# Patient Record
Sex: Female | Born: 1970 | Race: White | Hispanic: No | Marital: Single | Smoking: Current every day smoker
Health system: Southern US, Community
[De-identification: ages and names within clinical notes are randomized; demographics above are authoritative.]

## PROBLEM LIST (undated history)

## (undated) DIAGNOSIS — D473 Essential (hemorrhagic) thrombocythemia: Secondary | ICD-10-CM

## (undated) DIAGNOSIS — B009 Herpesviral infection, unspecified: Secondary | ICD-10-CM

## (undated) DIAGNOSIS — R87619 Unspecified abnormal cytological findings in specimens from cervix uteri: Secondary | ICD-10-CM

## (undated) DIAGNOSIS — N946 Dysmenorrhea, unspecified: Secondary | ICD-10-CM

## (undated) DIAGNOSIS — K149 Disease of tongue, unspecified: Secondary | ICD-10-CM

## (undated) DIAGNOSIS — R519 Headache, unspecified: Secondary | ICD-10-CM

## (undated) DIAGNOSIS — F329 Major depressive disorder, single episode, unspecified: Secondary | ICD-10-CM

## (undated) DIAGNOSIS — D649 Anemia, unspecified: Secondary | ICD-10-CM

## (undated) DIAGNOSIS — F319 Bipolar disorder, unspecified: Secondary | ICD-10-CM

## (undated) DIAGNOSIS — M1990 Unspecified osteoarthritis, unspecified site: Secondary | ICD-10-CM

## (undated) DIAGNOSIS — G8929 Other chronic pain: Secondary | ICD-10-CM

## (undated) DIAGNOSIS — C801 Malignant (primary) neoplasm, unspecified: Secondary | ICD-10-CM

## (undated) DIAGNOSIS — R51 Headache: Secondary | ICD-10-CM

## (undated) DIAGNOSIS — F32A Depression, unspecified: Secondary | ICD-10-CM

## (undated) DIAGNOSIS — A64 Unspecified sexually transmitted disease: Secondary | ICD-10-CM

## (undated) DIAGNOSIS — K635 Polyp of colon: Secondary | ICD-10-CM

## (undated) DIAGNOSIS — K219 Gastro-esophageal reflux disease without esophagitis: Secondary | ICD-10-CM

## (undated) DIAGNOSIS — J309 Allergic rhinitis, unspecified: Secondary | ICD-10-CM

## (undated) DIAGNOSIS — A048 Other specified bacterial intestinal infections: Secondary | ICD-10-CM

## (undated) DIAGNOSIS — D219 Benign neoplasm of connective and other soft tissue, unspecified: Secondary | ICD-10-CM

## (undated) DIAGNOSIS — L739 Follicular disorder, unspecified: Secondary | ICD-10-CM

## (undated) DIAGNOSIS — D75839 Thrombocytosis, unspecified: Secondary | ICD-10-CM

## (undated) DIAGNOSIS — R5383 Other fatigue: Secondary | ICD-10-CM

## (undated) HISTORY — DX: Bipolar disorder, unspecified: F31.9

## (undated) HISTORY — PX: ESOPHAGOGASTRODUODENOSCOPY: SHX1529

## (undated) HISTORY — DX: Thrombocytosis, unspecified: D75.839

## (undated) HISTORY — DX: Other fatigue: R53.83

## (undated) HISTORY — DX: Polyp of colon: K63.5

## (undated) HISTORY — DX: Depression, unspecified: F32.A

## (undated) HISTORY — DX: Disease of tongue, unspecified: K14.9

## (undated) HISTORY — DX: Allergic rhinitis, unspecified: J30.9

## (undated) HISTORY — DX: Other chronic pain: G89.29

## (undated) HISTORY — DX: Malignant (primary) neoplasm, unspecified: C80.1

## (undated) HISTORY — DX: Benign neoplasm of connective and other soft tissue, unspecified: D21.9

## (undated) HISTORY — DX: Anemia, unspecified: D64.9

## (undated) HISTORY — DX: Unspecified sexually transmitted disease: A64

## (undated) HISTORY — DX: Unspecified abnormal cytological findings in specimens from cervix uteri: R87.619

## (undated) HISTORY — PX: SKIN CANCER EXCISION: SHX779

## (undated) HISTORY — DX: Other specified bacterial intestinal infections: A04.8

## (undated) HISTORY — DX: Herpesviral infection, unspecified: B00.9

## (undated) HISTORY — DX: Headache: R51

## (undated) HISTORY — DX: Headache, unspecified: R51.9

## (undated) HISTORY — DX: Follicular disorder, unspecified: L73.9

## (undated) HISTORY — DX: Unspecified osteoarthritis, unspecified site: M19.90

## (undated) HISTORY — DX: Major depressive disorder, single episode, unspecified: F32.9

## (undated) HISTORY — DX: Dysmenorrhea, unspecified: N94.6

## (undated) HISTORY — DX: Essential (hemorrhagic) thrombocythemia: D47.3

---

## 1997-12-29 ENCOUNTER — Other Ambulatory Visit: Admission: RE | Admit: 1997-12-29 | Discharge: 1997-12-29 | Payer: Self-pay | Admitting: Gynecology

## 1998-02-18 HISTORY — PX: CHOLECYSTECTOMY: SHX55

## 1999-07-04 ENCOUNTER — Other Ambulatory Visit: Admission: RE | Admit: 1999-07-04 | Discharge: 1999-07-04 | Payer: Self-pay | Admitting: Obstetrics and Gynecology

## 1999-10-08 ENCOUNTER — Other Ambulatory Visit: Admission: RE | Admit: 1999-10-08 | Discharge: 1999-10-08 | Payer: Self-pay | Admitting: Gynecology

## 1999-11-20 ENCOUNTER — Encounter (INDEPENDENT_AMBULATORY_CARE_PROVIDER_SITE_OTHER): Payer: Self-pay | Admitting: Specialist

## 1999-11-20 ENCOUNTER — Other Ambulatory Visit: Admission: RE | Admit: 1999-11-20 | Discharge: 1999-11-20 | Payer: Self-pay | Admitting: Gynecology

## 2000-04-04 ENCOUNTER — Emergency Department (HOSPITAL_COMMUNITY): Admission: EM | Admit: 2000-04-04 | Discharge: 2000-04-04 | Payer: Self-pay | Admitting: Emergency Medicine

## 2000-04-04 ENCOUNTER — Encounter: Payer: Self-pay | Admitting: Emergency Medicine

## 2000-11-10 ENCOUNTER — Encounter: Payer: Self-pay | Admitting: Emergency Medicine

## 2000-11-10 ENCOUNTER — Emergency Department (HOSPITAL_COMMUNITY): Admission: EM | Admit: 2000-11-10 | Discharge: 2000-11-10 | Payer: Self-pay | Admitting: Emergency Medicine

## 2000-11-14 ENCOUNTER — Encounter (INDEPENDENT_AMBULATORY_CARE_PROVIDER_SITE_OTHER): Payer: Self-pay | Admitting: *Deleted

## 2000-11-14 ENCOUNTER — Observation Stay (HOSPITAL_COMMUNITY): Admission: RE | Admit: 2000-11-14 | Discharge: 2000-11-15 | Payer: Self-pay | Admitting: General Surgery

## 2000-11-14 ENCOUNTER — Encounter: Payer: Self-pay | Admitting: General Surgery

## 2000-12-18 ENCOUNTER — Ambulatory Visit (HOSPITAL_COMMUNITY): Admission: RE | Admit: 2000-12-18 | Discharge: 2000-12-18 | Payer: Self-pay | Admitting: General Surgery

## 2000-12-18 ENCOUNTER — Encounter: Payer: Self-pay | Admitting: General Surgery

## 2001-10-11 ENCOUNTER — Encounter: Payer: Self-pay | Admitting: Emergency Medicine

## 2001-10-11 ENCOUNTER — Emergency Department (HOSPITAL_COMMUNITY): Admission: EM | Admit: 2001-10-11 | Discharge: 2001-10-11 | Payer: Self-pay | Admitting: Emergency Medicine

## 2001-10-19 ENCOUNTER — Emergency Department (HOSPITAL_COMMUNITY): Admission: EM | Admit: 2001-10-19 | Discharge: 2001-10-19 | Payer: Self-pay | Admitting: Emergency Medicine

## 2001-11-04 ENCOUNTER — Other Ambulatory Visit: Admission: RE | Admit: 2001-11-04 | Discharge: 2001-11-04 | Payer: Self-pay | Admitting: Gynecology

## 2002-05-04 ENCOUNTER — Emergency Department (HOSPITAL_COMMUNITY): Admission: AC | Admit: 2002-05-04 | Discharge: 2002-05-04 | Payer: Self-pay

## 2002-05-05 ENCOUNTER — Encounter: Payer: Self-pay | Admitting: Emergency Medicine

## 2002-10-11 ENCOUNTER — Encounter: Admission: RE | Admit: 2002-10-11 | Discharge: 2002-10-11 | Payer: Self-pay | Admitting: Internal Medicine

## 2002-10-11 ENCOUNTER — Encounter: Payer: Self-pay | Admitting: Internal Medicine

## 2002-12-15 ENCOUNTER — Other Ambulatory Visit: Admission: RE | Admit: 2002-12-15 | Discharge: 2002-12-15 | Payer: Self-pay | Admitting: Obstetrics and Gynecology

## 2003-11-24 ENCOUNTER — Ambulatory Visit: Payer: Self-pay | Admitting: Internal Medicine

## 2003-12-07 ENCOUNTER — Emergency Department (HOSPITAL_COMMUNITY): Admission: EM | Admit: 2003-12-07 | Discharge: 2003-12-07 | Payer: Self-pay | Admitting: Emergency Medicine

## 2003-12-15 ENCOUNTER — Ambulatory Visit: Payer: Self-pay | Admitting: Internal Medicine

## 2004-01-26 ENCOUNTER — Ambulatory Visit: Payer: Self-pay | Admitting: Internal Medicine

## 2004-01-30 ENCOUNTER — Ambulatory Visit: Payer: Self-pay | Admitting: *Deleted

## 2004-02-24 ENCOUNTER — Ambulatory Visit: Payer: Self-pay | Admitting: Internal Medicine

## 2004-05-18 ENCOUNTER — Ambulatory Visit: Payer: Self-pay | Admitting: Internal Medicine

## 2004-08-24 ENCOUNTER — Ambulatory Visit: Payer: Self-pay | Admitting: Family Medicine

## 2004-10-17 ENCOUNTER — Ambulatory Visit: Payer: Self-pay | Admitting: Internal Medicine

## 2004-10-21 ENCOUNTER — Emergency Department: Payer: Self-pay | Admitting: General Practice

## 2004-11-27 ENCOUNTER — Ambulatory Visit: Payer: Self-pay | Admitting: Internal Medicine

## 2004-12-11 ENCOUNTER — Ambulatory Visit: Payer: Self-pay | Admitting: Internal Medicine

## 2005-07-18 ENCOUNTER — Encounter: Payer: Self-pay | Admitting: Internal Medicine

## 2005-07-18 ENCOUNTER — Ambulatory Visit: Payer: Self-pay | Admitting: Internal Medicine

## 2005-10-25 ENCOUNTER — Ambulatory Visit: Payer: Self-pay | Admitting: Internal Medicine

## 2005-12-02 ENCOUNTER — Ambulatory Visit: Payer: Self-pay | Admitting: Internal Medicine

## 2006-03-14 ENCOUNTER — Ambulatory Visit: Payer: Self-pay | Admitting: Internal Medicine

## 2006-08-04 ENCOUNTER — Encounter (INDEPENDENT_AMBULATORY_CARE_PROVIDER_SITE_OTHER): Payer: Self-pay | Admitting: Internal Medicine

## 2006-08-04 DIAGNOSIS — Z9089 Acquired absence of other organs: Secondary | ICD-10-CM | POA: Insufficient documentation

## 2006-08-04 DIAGNOSIS — F3160 Bipolar disorder, current episode mixed, unspecified: Secondary | ICD-10-CM | POA: Insufficient documentation

## 2006-10-10 ENCOUNTER — Ambulatory Visit: Payer: Self-pay | Admitting: Family Medicine

## 2006-11-05 ENCOUNTER — Encounter (INDEPENDENT_AMBULATORY_CARE_PROVIDER_SITE_OTHER): Payer: Self-pay | Admitting: *Deleted

## 2006-11-20 ENCOUNTER — Ambulatory Visit: Payer: Self-pay | Admitting: Internal Medicine

## 2006-11-20 ENCOUNTER — Encounter: Payer: Self-pay | Admitting: Internal Medicine

## 2006-11-20 LAB — CONVERTED CEMR LAB: TSH: 1.012 microintl units/mL (ref 0.350–5.50)

## 2006-12-03 ENCOUNTER — Ambulatory Visit: Payer: Self-pay | Admitting: Internal Medicine

## 2007-01-20 ENCOUNTER — Ambulatory Visit: Payer: Self-pay | Admitting: Internal Medicine

## 2007-01-20 LAB — CONVERTED CEMR LAB

## 2007-03-11 ENCOUNTER — Ambulatory Visit: Payer: Self-pay | Admitting: Internal Medicine

## 2007-03-11 LAB — CONVERTED CEMR LAB
Calcium: 9.4 mg/dL (ref 8.4–10.5)
Creatinine, Ser: 0.93 mg/dL (ref 0.40–1.20)
HDL: 49 mg/dL (ref >39)
Triglycerides: 123 mg/dL (ref <150)
Valproic Acid Lvl: 57.1 ug/mL (ref 50.0–100.0)

## 2007-04-16 ENCOUNTER — Ambulatory Visit (HOSPITAL_COMMUNITY): Admission: RE | Admit: 2007-04-16 | Discharge: 2007-04-16 | Payer: Self-pay | Admitting: Family Medicine

## 2007-05-04 ENCOUNTER — Ambulatory Visit: Payer: Self-pay | Admitting: Internal Medicine

## 2007-05-27 ENCOUNTER — Ambulatory Visit: Payer: Self-pay | Admitting: Internal Medicine

## 2007-06-12 ENCOUNTER — Ambulatory Visit: Payer: Self-pay | Admitting: Internal Medicine

## 2007-07-06 ENCOUNTER — Ambulatory Visit: Payer: Self-pay | Admitting: Internal Medicine

## 2007-07-07 ENCOUNTER — Ambulatory Visit (HOSPITAL_COMMUNITY): Admission: RE | Admit: 2007-07-07 | Discharge: 2007-07-07 | Payer: Self-pay | Admitting: Internal Medicine

## 2007-07-17 ENCOUNTER — Ambulatory Visit (HOSPITAL_COMMUNITY): Admission: RE | Admit: 2007-07-17 | Discharge: 2007-07-17 | Payer: Self-pay | Admitting: Internal Medicine

## 2007-08-04 ENCOUNTER — Ambulatory Visit: Payer: Self-pay | Admitting: Internal Medicine

## 2007-08-04 LAB — CONVERTED CEMR LAB
CO2: 27 meq/L (ref 19–32)
Chloride: 102 meq/L (ref 96–112)
Creatinine, Ser: 0.8 mg/dL (ref 0.40–1.20)
HDL: 47 mg/dL (ref >39)
LDL Cholesterol: 83 mg/dL (ref 0–99)

## 2007-10-06 ENCOUNTER — Ambulatory Visit: Payer: Self-pay | Admitting: Internal Medicine

## 2007-11-10 ENCOUNTER — Ambulatory Visit: Payer: Self-pay | Admitting: Internal Medicine

## 2007-11-11 ENCOUNTER — Encounter (INDEPENDENT_AMBULATORY_CARE_PROVIDER_SITE_OTHER): Payer: Self-pay | Admitting: Internal Medicine

## 2007-11-11 LAB — CONVERTED CEMR LAB: Chlamydia, DNA Probe: NEGATIVE

## 2007-11-12 ENCOUNTER — Ambulatory Visit: Payer: Self-pay | Admitting: Internal Medicine

## 2008-04-12 ENCOUNTER — Ambulatory Visit: Payer: Self-pay | Admitting: Internal Medicine

## 2008-05-27 ENCOUNTER — Emergency Department (HOSPITAL_COMMUNITY): Admission: EM | Admit: 2008-05-27 | Discharge: 2008-05-27 | Payer: Self-pay | Admitting: Emergency Medicine

## 2008-09-30 IMAGING — CR DG LUMBAR SPINE COMPLETE 4+V
5 series · 5 of 5 positions shown · non-contrast
Comparison: none

CLINICAL DATA: 36 year-old with low back pain.
 LUMBAR SPINE - __ VIEW:

[t l-spine a.p.]
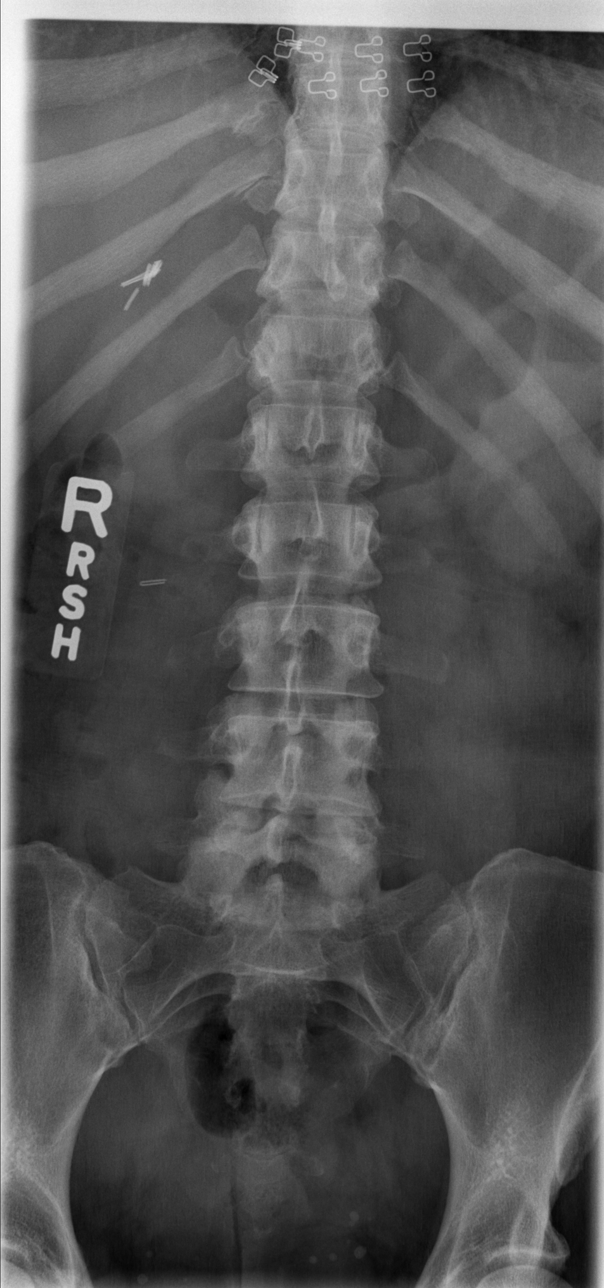

[t l-spine oblique exposure (1 of 2)]
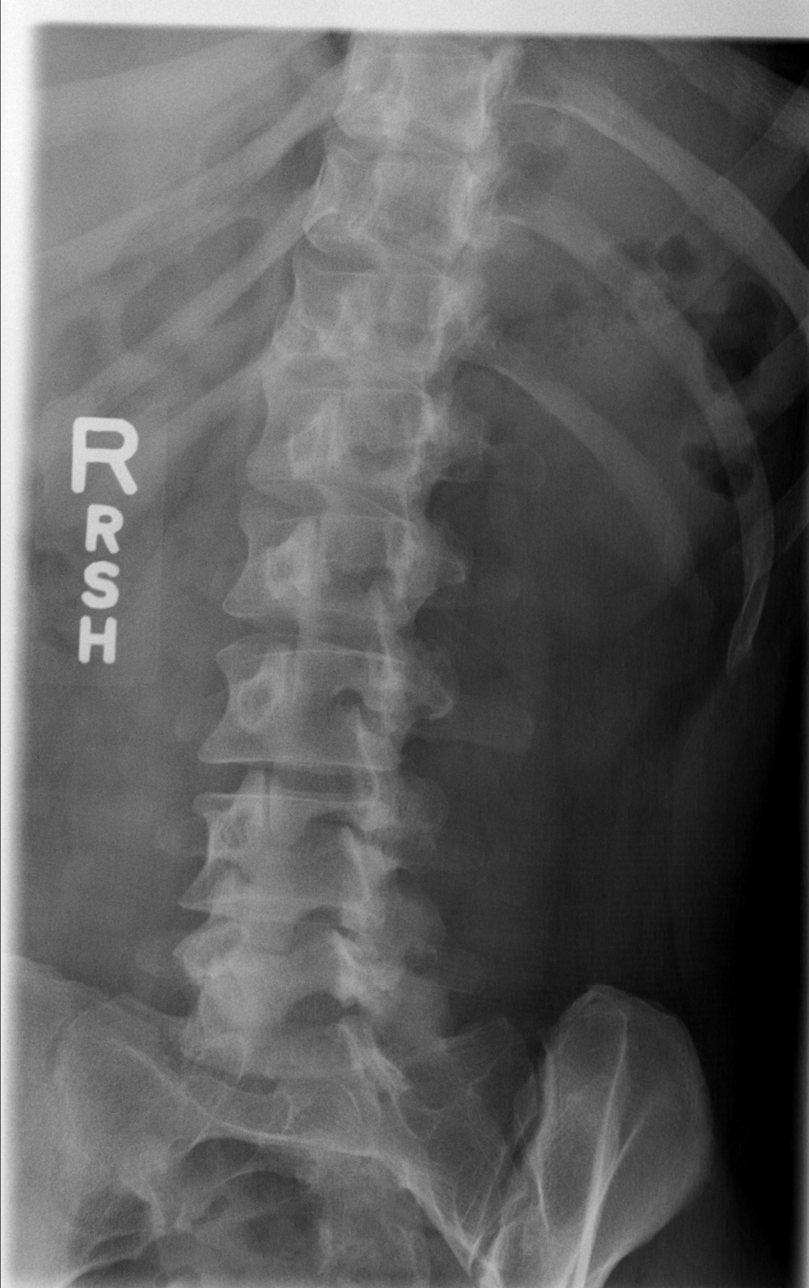

[t l-spine oblique exposure (2 of 2)]
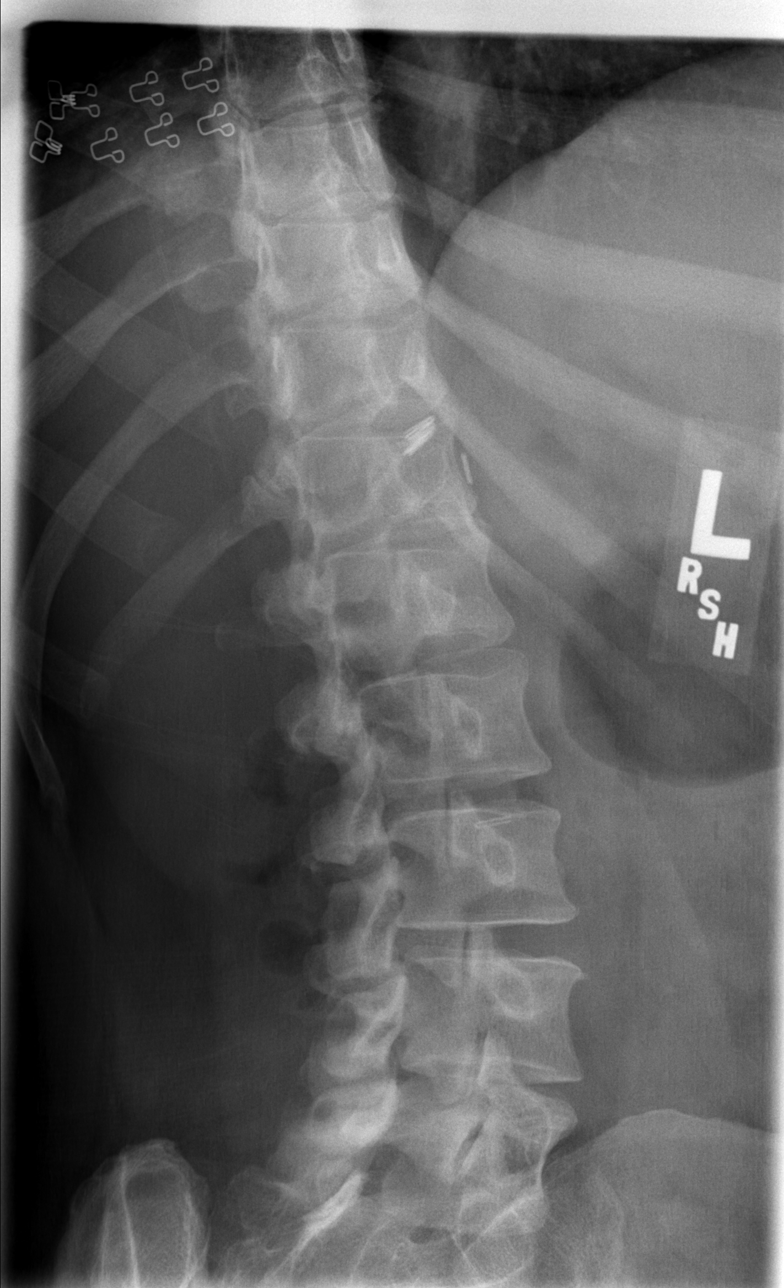

[t l-spine lat]
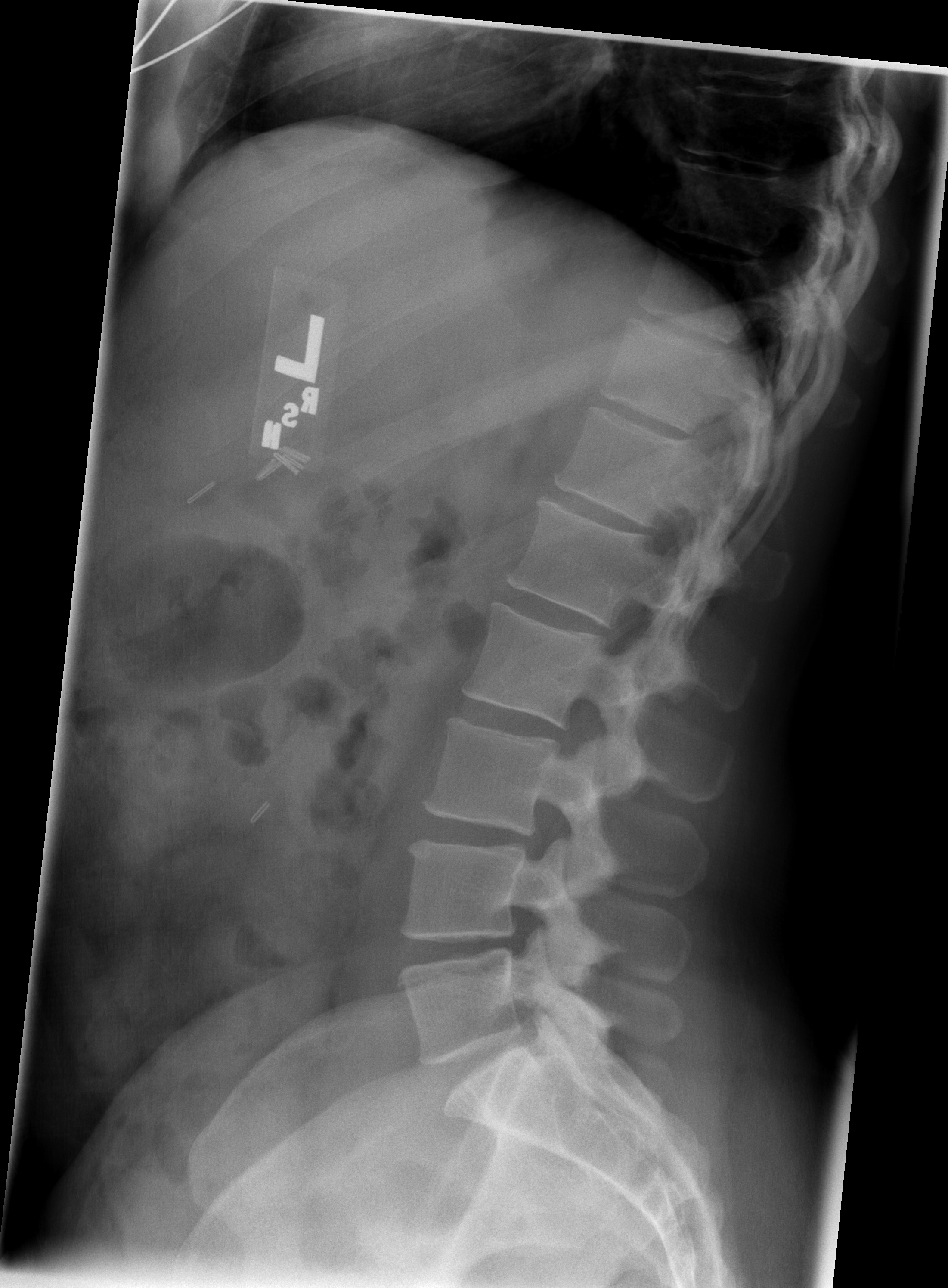

[t l-spine l5-s1 spot]
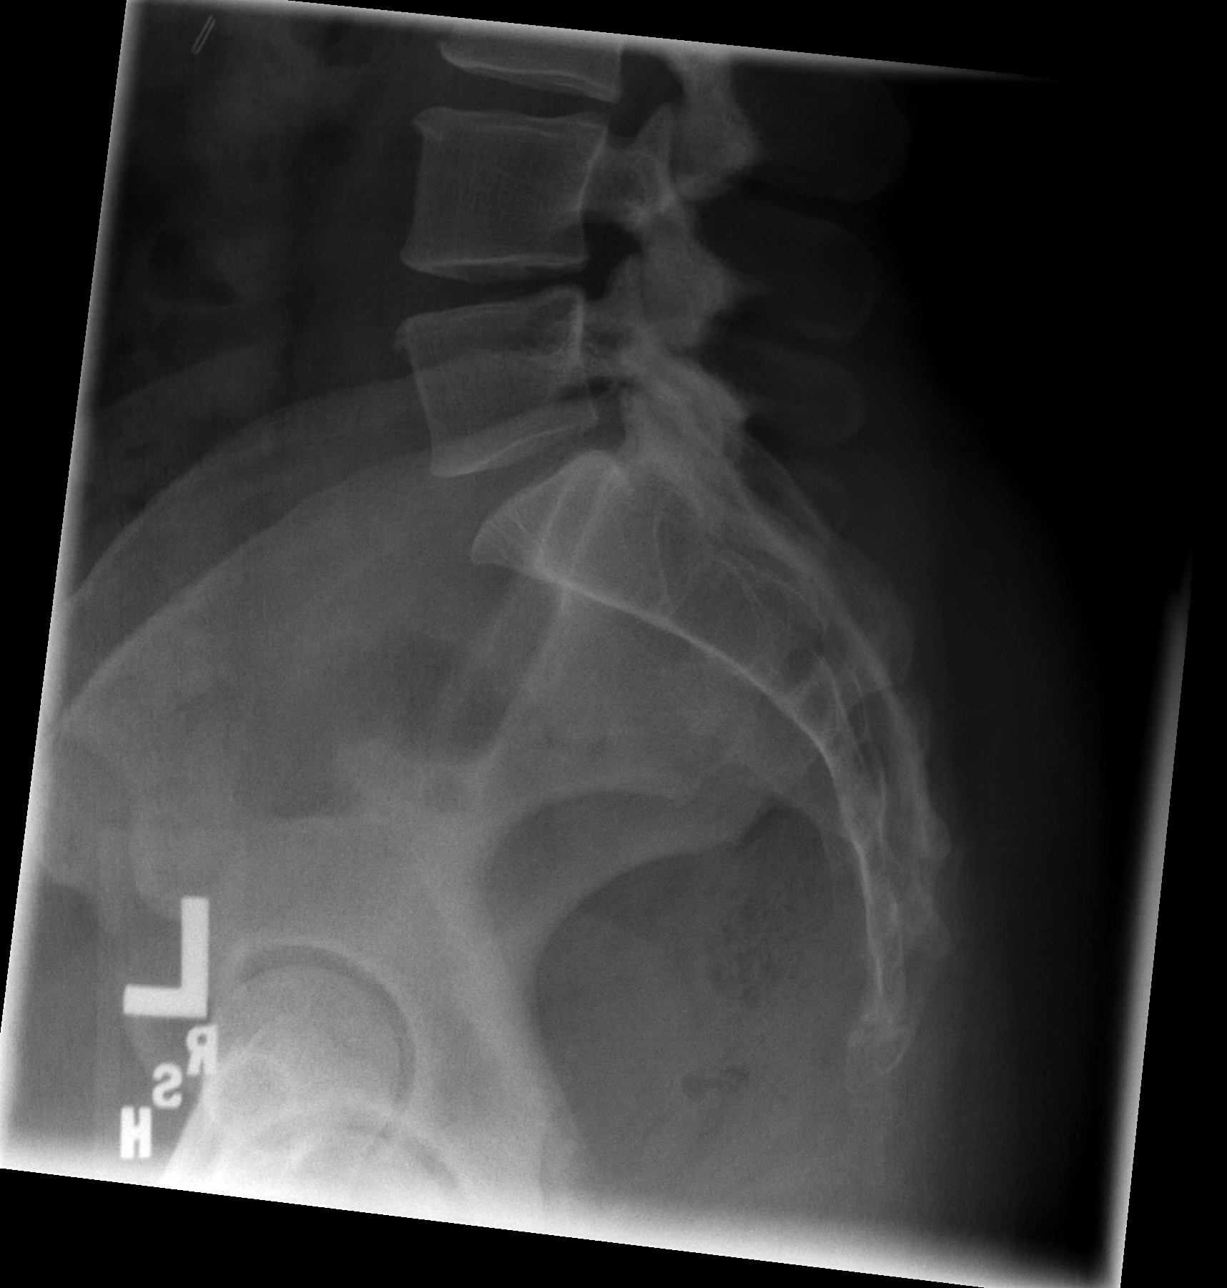

[5 of 5 positions shown; findings below may reference images not displayed]

FINDINGS: AP lateral and oblique images of the lumbar spine were obtained. The alignment of the lumbar spine is normal. The disk spaces are maintained. Mild degenerative endplate changes in the lower thoracic spine. No acute bone abnormalities. Mild degenerative endplate changes in the lumbar spine. Surgical clips in the right abdomen consistent with cholecystectomy. The spinous process of T-12 is low-lying compared to the T-11 spinous process.
IMPRESSION: No acute bone abnormalities of the lumbar spine.

## 2008-10-14 ENCOUNTER — Ambulatory Visit (HOSPITAL_COMMUNITY): Admission: RE | Admit: 2008-10-14 | Discharge: 2008-10-14 | Payer: Self-pay | Admitting: General Surgery

## 2008-10-14 ENCOUNTER — Encounter (INDEPENDENT_AMBULATORY_CARE_PROVIDER_SITE_OTHER): Payer: Self-pay | Admitting: General Surgery

## 2009-01-19 ENCOUNTER — Ambulatory Visit: Payer: Self-pay | Admitting: Internal Medicine

## 2009-01-19 LAB — CONVERTED CEMR LAB
Alkaline Phosphatase: 67 units/L (ref 39–117)
BUN: 19 mg/dL (ref 6–23)
Basophils Absolute: 0.1 10*3/uL (ref 0.0–0.1)
Basophils Relative: 1 % (ref 0–1)
Creatinine, Ser: 0.79 mg/dL (ref 0.40–1.20)
Eosinophils Absolute: 0.1 10*3/uL (ref 0.0–0.7)
Eosinophils Relative: 2 % (ref 0–5)
GC Probe Amp, Genital: NEGATIVE
Glucose, Bld: 86 mg/dL (ref 70–99)
HCT: 38.3 % (ref 36.0–46.0)
HDL: 45 mg/dL (ref >39)
Hemoglobin: 12.3 g/dL (ref 12.0–15.0)
LDL Cholesterol: 120 mg/dL — ABNORMAL HIGH (ref 0–99)
MCHC: 32.1 g/dL (ref 30.0–36.0)
Monocytes Absolute: 0.5 10*3/uL (ref 0.1–1.0)
Neutro Abs: 3.9 10*3/uL (ref 1.7–7.7)
RDW: 13.9 % (ref 11.5–15.5)
Total Bilirubin: 0.3 mg/dL (ref 0.3–1.2)
Triglycerides: 187 mg/dL — ABNORMAL HIGH (ref <150)
VLDL: 37 mg/dL (ref 0–40)

## 2009-01-19 LAB — HM PAP SMEAR: HM Pap smear: NEGATIVE

## 2009-02-16 ENCOUNTER — Ambulatory Visit (HOSPITAL_COMMUNITY): Admission: RE | Admit: 2009-02-16 | Discharge: 2009-02-16 | Payer: Self-pay | Admitting: Psychiatry

## 2009-05-10 ENCOUNTER — Ambulatory Visit (HOSPITAL_COMMUNITY): Admission: RE | Admit: 2009-05-10 | Discharge: 2009-05-10 | Payer: Self-pay | Admitting: Internal Medicine

## 2009-05-23 ENCOUNTER — Emergency Department (HOSPITAL_COMMUNITY): Admission: EM | Admit: 2009-05-23 | Discharge: 2009-05-23 | Payer: Self-pay | Admitting: Family Medicine

## 2009-05-31 ENCOUNTER — Encounter (INDEPENDENT_AMBULATORY_CARE_PROVIDER_SITE_OTHER): Payer: Self-pay | Admitting: Family Medicine

## 2009-05-31 ENCOUNTER — Ambulatory Visit: Payer: Self-pay | Admitting: Family Medicine

## 2009-07-11 ENCOUNTER — Ambulatory Visit: Payer: Self-pay | Admitting: Internal Medicine

## 2009-07-20 ENCOUNTER — Emergency Department (HOSPITAL_COMMUNITY): Admission: EM | Admit: 2009-07-20 | Discharge: 2009-07-20 | Payer: Self-pay | Admitting: Family Medicine

## 2009-07-24 ENCOUNTER — Emergency Department (HOSPITAL_COMMUNITY): Admission: EM | Admit: 2009-07-24 | Discharge: 2009-07-24 | Payer: Self-pay | Admitting: Family Medicine

## 2009-08-08 ENCOUNTER — Ambulatory Visit: Payer: Self-pay | Admitting: Internal Medicine

## 2009-08-08 LAB — CONVERTED CEMR LAB
Albumin: 4 g/dL (ref 3.5–5.2)
Alkaline Phosphatase: 72 units/L (ref 39–117)
BUN: 19 mg/dL (ref 6–23)
Basophils Absolute: 0 10*3/uL (ref 0.0–0.1)
Basophils Relative: 1 % (ref 0–1)
CO2: 28 meq/L (ref 19–32)
Calcium: 9.1 mg/dL (ref 8.4–10.5)
Eosinophils Relative: 3 % (ref 0–5)
Glucose, Bld: 90 mg/dL (ref 70–99)
HCT: 39.3 % (ref 36.0–46.0)
Hemoglobin: 12.5 g/dL (ref 12.0–15.0)
Lymphocytes Relative: 37 % (ref 12–46)
MCHC: 31.8 g/dL (ref 30.0–36.0)
Monocytes Absolute: 0.5 10*3/uL (ref 0.1–1.0)
Monocytes Relative: 6 % (ref 3–12)
Potassium: 4.6 meq/L (ref 3.5–5.3)
RBC: 4.31 M/uL (ref 3.87–5.11)
RDW: 15.2 % (ref 11.5–15.5)
Total Protein: 6.5 g/dL (ref 6.0–8.3)
Valproic Acid Lvl: 2.7 ug/mL — ABNORMAL LOW (ref 50.0–100.0)

## 2009-08-15 ENCOUNTER — Ambulatory Visit: Payer: Self-pay | Admitting: Family Medicine

## 2009-08-15 DIAGNOSIS — M25569 Pain in unspecified knee: Secondary | ICD-10-CM | POA: Insufficient documentation

## 2009-08-15 DIAGNOSIS — M6530 Trigger finger, unspecified finger: Secondary | ICD-10-CM | POA: Insufficient documentation

## 2009-08-15 DIAGNOSIS — M549 Dorsalgia, unspecified: Secondary | ICD-10-CM | POA: Insufficient documentation

## 2010-03-10 ENCOUNTER — Encounter: Payer: Self-pay | Admitting: Internal Medicine

## 2010-03-11 ENCOUNTER — Encounter: Payer: Self-pay | Admitting: Internal Medicine

## 2010-03-20 NOTE — Assessment & Plan Note (Signed)
Summary: NP,R TRIGGER THUMB,R KNEE PAIN,MC   Vital Signs:  Patient profile:   40 year old female Height:      60 inches Weight:      145 pounds BMI:     28.42 BP sitting:   106 / 63  Vitals Entered By: Lillia Pauls CMA (August 15, 2009 11:44 AM)  History of Present Illness: Rt Trigger Thumb: Pt has had a trigger thumb for about 5 months. She says the thumb pops and gets stuck at certain positions. She says it waxes and waynes. She tried Mobic 2 months ago but says it did not help.   Rt knee pain: Pt has had Rt knee pain that started in Jan when she fell on her knee. She went to the [facility] and had x-rays. She was told that she did not have any fractures and was recommended to do some rehab. She was unable to pay for rehab so she did not go. She has continued to move. She says it keeps her from sitting with her knee flexed and abducted. She says the pain is posterior. no mechanical symptoms.  Upper back pain: Pt has had some upper back pain and headaches the last 2 weeks. She feels like her upper back is tight and it sends pain up her neck and into her shoulder. It does not send numbness or tingling. She has been taking pain meds for it but they have not been helping.   Allergies: 1)  Cipro  Physical Exam  General:  GEN: Well-developed,well-nourished,in no acute distress; alert,appropriate and cooperative throughout examination HEENT: Normocephalic and atraumatic without obvious abnormalities. No apparent alopecia or balding. Ears, externally no deformities PULM: Breathing comfortably in no respiratory distress EXT: No clubbing, cyanosis, or edema PSYCH: Normally interactive. Cooperative during the interview. Pleasant. Friendly and conversant. Not anxious or depressed appearing. Normal, full affect.  Msk:  upper Rt sided back muscle tightness. mostly notable in the paraspinous region in the  middle to lower Tonette Bihari is region. Also in the rhomboid region. Normal range of motion  of the neck  Rt thumb has tender point at the base of the thumb. No erythema, no swelling. Decreased ROM. palpable nodule. Otherwise wrist stable with full composite grip and negative axial loading test and a negative and nontender anatomical soft box  Rt knee: Pt has not tenderness that can be elicited on exam. She has a neg McMurry's, Apply's, ACL,PCL,LCL and MCL all normal with special testing.   no effusion.  Nontender to medial and lateral patellar facet loading. Negative grind maneuver. Negative bounce test. Negative flexion pinch test.   Impression & Recommendations:  Problem # 1:  TRIGGER FINGER, RIGHT THUMB (ICD-727.03) Assessment New  Pt recieved an injection today. Discussed with patient to let us know if she is has any further trouble with her thumb. Can do a second injection in 4-6 weeks if not improved.   >45 minutes spent in total face to face time with the patient with >50% of time spent in counselling and coordination of care: the majority of this face-to-face encounter was spent in  explaining various conditions, discussion, reviewed anatomy, review of rehabilitation..  Trigger Finger Injection Verbal consent was obtained. Risks, benefits and alternatives were discussed. Prepped with Betadine and Ethyl Chloride used for anesthesia. Under sterile conditions, patient injected at palmar crease aiming distally with 45 degree angle towards nodule; injected directly into tendon sheath. Medication flowed freely without resistance.  Needle size: 25 gauge 1 1/2 inch  Injection: 1/2 cc of Lidocaine 1% and 1/2 cc of Kenalog 40 mg   Orders: Injection, Tendon / Ligament (20550) Kenalog 10mg  (up to 2 units) (T5573)  Problem # 2:  KNEE PAIN, RIGHT, CHRONIC (ICD-719.46) Assessment: New PT has had knee pain since Jan when she fell. all testing of the knee was neg.   reassured the patient, do not think that there is any significant internal arrangement. Reviewed general need  rehabilitation strengthening  Her updated medication list for this problem includes:    Diclofenac Sodium 75 Mg Tbec (Diclofenac sodium) ..... One tab by mouth two times a day as needed pain    Cyclobenzaprine Hcl 10 Mg Tabs (Cyclobenzaprine hcl) .Marland Kitchen... 1/2 - 1 by mouth at bedtime as needed muscle spasm  Problem # 3:  BACK PAIN, UPPER (ICD-724.5) Assessment: New Pt likely has a muscle strain of the upper back causing her headaches.   scapular stabilization was reviewed with the patient.  Her updated medication list for this problem includes:    Diclofenac Sodium 75 Mg Tbec (Diclofenac sodium) ..... One tab by mouth two times a day as needed pain    Cyclobenzaprine Hcl 10 Mg Tabs (Cyclobenzaprine hcl) .Marland Kitchen... 1/2 - 1 by mouth at bedtime as needed muscle spasm  Complete Medication List: 1)  Protonix 40 Mg Tbec (Pantoprazole sodium) .... One by mouth qd 2)  Acyclovir 200 Mg Caps (Acyclovir) .... 2 by mouth two times a day as needed outbreak 3)  Wellbutrin Xl 300 Mg Tb24 (Bupropion hcl) .... One by mouth qd 4)  Trazodone Hcl 100 Mg Tabs (Trazodone hcl) .... One half tab qhs 5)  Diclofenac Sodium 75 Mg Tbec (Diclofenac sodium) .... One tab by mouth two times a day as needed pain 6)  Cyclobenzaprine Hcl 10 Mg Tabs (Cyclobenzaprine hcl) .... 1/2 - 1 by mouth at bedtime as needed muscle spasm Prescriptions: CYCLOBENZAPRINE HCL 10 MG TABS (CYCLOBENZAPRINE HCL) 1/2 - 1 by mouth at bedtime as needed muscle spasm  #20 x 0   Entered and Authorized by:   Jannifer Rodney MD   Signed by:   Jannifer Rodney MD on 08/15/2009   Method used:   Electronically to        [facility] (retail)       [address]       [place], [place]  [address]       Ph: [phone]       Fax: [phone]   RxID:   [id number] DICLOFENAC SODIUM 75 MG TBEC (DICLOFENAC SODIUM) One tab by mouth two times a day as needed pain  #60 x 0   Entered and Authorized by:   Jannifer Rodney MD    Signed by:   Jannifer Rodney MD on 08/15/2009   Method used:   Electronically to        [facility] (retail)       [address]       [place], [place]  [address]       Ph: [phone]       Fax: [phone]   RxID:   [id number]

## 2010-05-07 LAB — POCT I-STAT, CHEM 8
Creatinine, Ser: 1 mg/dL (ref 0.4–1.2)
HCT: 44 % (ref 36.0–46.0)
Hemoglobin: 15 g/dL (ref 12.0–15.0)
Potassium: 4.1 mEq/L (ref 3.5–5.1)
Sodium: 138 mEq/L (ref 135–145)
TCO2: 29 mmol/L (ref 0–100)

## 2010-05-26 LAB — CBC
Hemoglobin: 12.1 g/dL (ref 12.0–15.0)
RBC: 4.08 MIL/uL (ref 3.87–5.11)
WBC: 9.9 10*3/uL (ref 4.0–10.5)

## 2010-07-03 NOTE — Op Note (Signed)
NAMEBRIANNI, MANTHE                 ACCOUNT NO.:  [id number]   MEDICAL RECORD NO.:  [id number]          PATIENT TYPE:  AMB   LOCATION:  SDS                          FACILITY:  [facility]   PHYSICIAN:  Ollen Gross. Vernell Morgans, M.D. 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   PROCEDURE:  Wide excision of squamous cell carcinoma in situ from the  gluteal cleft.   SURGEON:  Ollen Gross. Vernell Morgans, MD   ANESTHESIA:  General endotracheal.   PROCEDURE:  After informed consent was obtained, the patient was brought  to the operating room and left in the supine position on a stretcher.  After an adequate induction of general anesthesia, the patient was then  moved into a prone position on the operating room table and all pressure  points were padded.  Her buttocks were retracted laterally with some  tape.  The gluteal cleft area and perirectal area was then all prepped  with Betadine and draped in the usual sterile manner.  The area in  question had been previously reexcised once with a positive margin.  It  was then reexcised by a pathologist, but no pathology was sent.  An  elliptical incision was made around the patient's old scar.  This was  carried down through the skin and subcutaneous tissue sharply with  electrocautery until the area was completely reexcised.  It was oriented  with a short stitch superior and a long stitch on the left side.  It was  then sent to Pathology for further evaluation.  Hemostasis was achieved  using Bovie electrocautery.  The wound was infiltrated with 0.25%  Marcaine.  The wound was then closed with interrupted 4-0 Monocryl  subcuticular stitches and a Dermabond dressing was applied.  The patient  tolerated the  procedure well.  At the end of the case, all needle,  sponge, and instrument counts were correct.  The patient was awakened  and taken to the recovery room in stable condition.      Ollen Gross. Vernell Morgans, M.D.  Electronically Signed     PST/MEDQ  D:  10/14/2008  T:  10/14/2008  Job:  [id number]

## 2010-07-06 NOTE — Consult Note (Signed)
[facility]  Patient:    Sarah Barry, Sarah Barry Visit Number: [id number] MRN: [id number]          Service Type: EMS Location: ED Attending Physician:  Benny Lennert Dictated by:   [place] Daphine Deutscher, M.D. Proc. Date: 11/10/00 Admit Date:  11/10/2000 Discharge Date: 11/10/2000                            Consultation Report  CHIEF COMPLAINT:  Mid epigastric pain radiating into the back.  HISTORY:  This is a 40 year old white female with a history of GERD treated by Dr. Prentiss Bells in the past. Currently, she is in between doctors since Dr. Prentiss Bells is going to cut back her practice. About 2:30 this morning, she began having chest pain radiating to her back that she described as severe. She was seen in the emergency room here around 3:50 and labs drawn at that time demonstrated white count of 10,700, hemoglobin 13.2, apparently normal differential. Lipase was normal at 27 and amylase was normal at 79. Her electrolytes were apparently within the normal range. She was kept her and had an ultrasound which showed multiple small gallstones. There is no pericholecystic fluid noted. There was no wall thickening noted. I did review the ultrasound in the emergency department.  ALLERGIES:  No known drug allergies.  PAST SURGICAL HISTORY:  The patient has had no prior abdominal surgery.  Physical exam is remarkable in that she has some tenderness in her mid epigastrium and right upper quadrant. She has received some Demerol. We discussed, with a woman accompanying her, her options including observation and seeing if the pain goes away followed by elective discussion about laparoscopic cholecystectomy versus going ahead and keeping her today and doing a laparoscopic cholecystectomy later this afternoon. She would like to wait and see if this pain can get better. We will give her a little shot of Toradol, and she wants to see if it abates and she wants to go home and try  to manage her symptoms with diet and consider elective cholecystectomy. Will discuss with ER doctor regarding disposition and disposition today will depend on whether her pain abates over the next two hours.  IMPRESSION:  Acute cholecystitis, possibly resolving.  PLAN:  As described above. Dictated by:   [place] Daphine Deutscher, M.D. Attending Physician:  Benny Lennert DD:  11/10/00 TD:  11/10/00 Job: [id number]

## 2010-07-06 NOTE — Op Note (Signed)
[facility]  Patient:    Sarah Barry, Sarah Barry Visit Number: [id number] MRN: [id number]          Service Type: OBV Location: 3W 4782 01 Attending Physician:  Caleen Essex Dictated by:   Ollen Gross. Vernell Morgans, M.D. Proc. Date: 11/14/00 Admit Date:  11/14/2000 Discharge Date: 11/15/2000                             Operative Report  PREOPERATIVE DIAGNOSIS:  Cholelithiasis.  POSTOPERATIVE DIAGNOSIS:  Cholelithiasis.  PROCEDURE:  Laparoscopic cholecystectomy with intraoperative cholangiogram.  SURGEON:  Ollen Gross. Vernell Morgans, M.D.  ASSISTANT:  Anselm Pancoast. Zachery Dakins, M.D.  ANESTHESIA:  General endotracheal.  DESCRIPTION OF PROCEDURE:  After informed consent was obtained, the patient was brought to the operating room and placed in the supine position on the operating table.  After adequate induction of general anesthesia, the patients abdomen was prepped with Betadine, and draped in the usual sterile fashion.  Another area just above the umbilicus was infiltrated with 0.25% Marcaine, and a small transverse incision was made.  This incision was carried down through the subcutaneous tissues, and blunt dissection with Ora clamp and Army-Navy retractors until the linea alba was identified.  The linea alba was incised with the 15 blade knife, and each side was grasped with Kocher clamps and elevated anteriorly.  The preperitoneal space was then probed bluntly with the hemostat until the peritoneum was opened and access obtained into the abdominal cavity.  A finger was inserted through this opening to make sure there were no adhesions to the anterior abdominal wall and there were none apparent.  A 0 Vicryl pursestring stitch was then placed on the fascia surrounding this opening, and the laparoscope was placed through this opening, and anchored in place with the previously placed Vicryl pursestring stitch. The abdomen was then insufflated with carbon dioxide without  difficulty.  The laparoscope was inserted into the Hasson cannula and the dome of the gallbladder and liver edge were readily identifiable.  A small transverse upper midline incision was made with the 15 blade knife after infiltrating this area with 0.25% Marcaine.  A 10 mm port was then placed bluntly through this incision into the abdominal cavity without difficulty under direct vision.  A placed was then chosen laterally on the right side of the abdomen for placement of 5 mm ports, and these areas were infiltrated with 0.25% Marcaine.  Small stab incisions were made with the 15 blade knife.  The two 5 mm ports were then inserted through these incisions bluntly under direct vision.  A blunt grasper was then placed through the lateral most 5 mm port and used to grasp the dome of the gallbladder and elevate it anteriorly and superiorly. Another blunt grasper was placed at the other 5 mm port and used to retract on the body and neck of the gallbladder.  A dissector was placed through the upper midline port, and used in a combination with the electrocautery and blunt dissection, the peritoneal reflection over the top of the gallbladder neck was opened.  Blunt dissection was then carried out in this area until the gallbladder neck and cystic duct junction was readily identified and dissected in a circumferential manner.  Care was taken to keep the common duct medial in this dissection.  Once the gallbladder and the cystic duct junction was identified, a clip was placed distally on the gallbladder neck side, and a small  ductotomy was made and the cystic duct with a laparoscopic scissors.  A 14-gauge angiocath was inserted percutaneously through the anterior abdominal wall.  A Reddick catheter was placed through this into the abdominal cavity.  Using the dissector, the Reddick catheter was placed within the cystic duct, and anchored in place with a clip.  A cholangiogram was then obtained  which showed no filling defects within the biliary tree.  The anchoring clip was then removed, and the angiocath and Reddick catheter were also removed.  Three clips were then placed proximally on the cystic duct, and the duct was divided between the two sets of clips.  Posterior to this, the cystic artery was identified.  Two clips were placed proximally and one distally on the cystic artery, and the artery was divided between the two.  Next, a laparoscopic hook cautery was used to separate the gallbladder from the liver bed.  Prior to completely detaching the gallbladder from the liver bed, the liver bed was inspected and several small bleeding points required coagulation with the Bovie electrocautery until the liver bed was hemostatic. The gallbladder was then detached the rest of the way from the liver bed.  An endoscopic bag was then placed through the upper midline port, and the gallbladder was placed within the endoscopic bag.  The laparoscope was then moved to the upper midline port and the gallbladder and endoscopic bag were removed together through the Hasson cannula and the supraumbilical port.  The Hasson was then replaced and the laparoscope was moved back to the Hasson cannula.  The abdomen was then inspected and the liver bed was found to be hemostatic.  The abdomen was then irrigated with copious amounts of saline until the effluent was clear.  The ports were then all removed under direct vision and found to be hemostatic.  The fascia at the supraumbilical port was closed with the previously placed Vicryl pursestring stitch.  The skin incisions were closed with interrupted 4-0 Monocryl subcuticular stitches.  Benzoin and Steri-Strips were applied.  The patient tolerated the procedure well.  At the end of the case, all needle, sponge, and instrument counts were correct.  The patient was then awakened and taken to the recovery room in stable condition. Dictated by:   Ollen Gross. Vernell Morgans, M.D. Attending Physician:  Caleen Essex DD:  12/22/00 TD:  12/23/00 Job: [id number]

## 2010-09-06 ENCOUNTER — Ambulatory Visit (HOSPITAL_COMMUNITY)
Admission: RE | Admit: 2010-09-06 | Discharge: 2010-09-06 | Disposition: A | Discharge status: Home / Self Care | Payer: Self-pay | Attending: Psychiatry | Admitting: Psychiatry

## 2010-09-06 DIAGNOSIS — F1920 Other psychoactive substance dependence, uncomplicated: Secondary | ICD-10-CM | POA: Insufficient documentation

## 2010-09-07 ENCOUNTER — Other Ambulatory Visit (HOSPITAL_COMMUNITY): Payer: PRIVATE HEALTH INSURANCE | Admitting: Psychiatry

## 2010-09-10 ENCOUNTER — Other Ambulatory Visit (HOSPITAL_COMMUNITY): Payer: PRIVATE HEALTH INSURANCE | Attending: Psychiatry | Admitting: Psychiatry

## 2010-09-10 DIAGNOSIS — F1920 Other psychoactive substance dependence, uncomplicated: Secondary | ICD-10-CM | POA: Insufficient documentation

## 2010-09-10 DIAGNOSIS — F329 Major depressive disorder, single episode, unspecified: Secondary | ICD-10-CM | POA: Insufficient documentation

## 2010-09-10 DIAGNOSIS — Z560 Unemployment, unspecified: Secondary | ICD-10-CM | POA: Insufficient documentation

## 2010-09-12 ENCOUNTER — Other Ambulatory Visit (HOSPITAL_COMMUNITY): Payer: PRIVATE HEALTH INSURANCE | Admitting: Psychiatry

## 2010-09-14 ENCOUNTER — Other Ambulatory Visit (HOSPITAL_COMMUNITY): Payer: PRIVATE HEALTH INSURANCE | Admitting: Psychiatry

## 2010-09-17 ENCOUNTER — Other Ambulatory Visit (HOSPITAL_COMMUNITY): Payer: PRIVATE HEALTH INSURANCE | Admitting: Psychiatry

## 2010-09-18 ENCOUNTER — Other Ambulatory Visit: Payer: Self-pay | Admitting: Family Medicine

## 2010-09-19 ENCOUNTER — Other Ambulatory Visit (HOSPITAL_COMMUNITY): Payer: PRIVATE HEALTH INSURANCE | Attending: Psychiatry | Admitting: Psychiatry

## 2010-09-19 DIAGNOSIS — F329 Major depressive disorder, single episode, unspecified: Secondary | ICD-10-CM | POA: Insufficient documentation

## 2010-09-19 DIAGNOSIS — F1920 Other psychoactive substance dependence, uncomplicated: Secondary | ICD-10-CM | POA: Insufficient documentation

## 2010-09-19 DIAGNOSIS — Z560 Unemployment, unspecified: Secondary | ICD-10-CM | POA: Insufficient documentation

## 2010-09-21 ENCOUNTER — Other Ambulatory Visit (HOSPITAL_COMMUNITY): Payer: PRIVATE HEALTH INSURANCE | Admitting: Psychiatry

## 2010-09-24 ENCOUNTER — Other Ambulatory Visit (HOSPITAL_COMMUNITY): Payer: PRIVATE HEALTH INSURANCE | Admitting: Psychiatry

## 2010-09-26 ENCOUNTER — Other Ambulatory Visit (HOSPITAL_COMMUNITY): Payer: PRIVATE HEALTH INSURANCE | Admitting: Psychiatry

## 2010-09-27 ENCOUNTER — Other Ambulatory Visit (HOSPITAL_COMMUNITY): Payer: Self-pay | Admitting: Family Medicine

## 2010-09-27 DIAGNOSIS — Z1231 Encounter for screening mammogram for malignant neoplasm of breast: Secondary | ICD-10-CM

## 2010-09-28 ENCOUNTER — Other Ambulatory Visit (HOSPITAL_COMMUNITY): Payer: PRIVATE HEALTH INSURANCE | Admitting: Psychiatry

## 2010-10-01 ENCOUNTER — Other Ambulatory Visit (HOSPITAL_COMMUNITY): Payer: PRIVATE HEALTH INSURANCE | Admitting: Psychiatry

## 2010-10-03 ENCOUNTER — Other Ambulatory Visit (HOSPITAL_COMMUNITY): Payer: PRIVATE HEALTH INSURANCE | Admitting: Psychiatry

## 2010-10-03 ENCOUNTER — Ambulatory Visit (HOSPITAL_COMMUNITY)
Admission: RE | Admit: 2010-10-03 | Discharge: 2010-10-03 | Disposition: A | Discharge status: Home / Self Care | Payer: PRIVATE HEALTH INSURANCE | Source: Ambulatory Visit | Attending: Family Medicine | Admitting: Family Medicine

## 2010-10-03 DIAGNOSIS — Z1231 Encounter for screening mammogram for malignant neoplasm of breast: Secondary | ICD-10-CM | POA: Insufficient documentation

## 2010-10-03 LAB — HM MAMMOGRAPHY: HM Mammogram: NEGATIVE

## 2010-10-05 ENCOUNTER — Other Ambulatory Visit (HOSPITAL_COMMUNITY): Payer: PRIVATE HEALTH INSURANCE | Admitting: Psychiatry

## 2010-10-08 ENCOUNTER — Other Ambulatory Visit (HOSPITAL_COMMUNITY): Payer: PRIVATE HEALTH INSURANCE | Admitting: Psychiatry

## 2010-10-10 ENCOUNTER — Other Ambulatory Visit (HOSPITAL_COMMUNITY): Payer: Self-pay | Admitting: Psychiatry

## 2010-10-10 ENCOUNTER — Other Ambulatory Visit (HOSPITAL_COMMUNITY): Payer: PRIVATE HEALTH INSURANCE | Admitting: Psychiatry

## 2010-10-11 LAB — URINE DRUGS OF ABUSE SCREEN W ALC, ROUTINE (REF LAB)
Amphetamine Screen, Ur: NEGATIVE
Barbiturate Quant, Ur: NEGATIVE
Benzodiazepines.: NEGATIVE
Marijuana Metabolite: NEGATIVE
Methadone: NEGATIVE
Opiate Screen, Urine: NEGATIVE
Propoxyphene: NEGATIVE

## 2010-10-12 ENCOUNTER — Other Ambulatory Visit (HOSPITAL_COMMUNITY): Payer: PRIVATE HEALTH INSURANCE | Admitting: Psychiatry

## 2010-10-15 ENCOUNTER — Other Ambulatory Visit (HOSPITAL_COMMUNITY): Payer: PRIVATE HEALTH INSURANCE | Admitting: Psychiatry

## 2010-10-17 ENCOUNTER — Other Ambulatory Visit (HOSPITAL_COMMUNITY): Payer: PRIVATE HEALTH INSURANCE

## 2010-10-19 ENCOUNTER — Other Ambulatory Visit (HOSPITAL_COMMUNITY): Payer: PRIVATE HEALTH INSURANCE

## 2010-10-23 ENCOUNTER — Other Ambulatory Visit (HOSPITAL_COMMUNITY): Payer: PRIVATE HEALTH INSURANCE | Attending: Physician Assistant

## 2010-10-23 ENCOUNTER — Other Ambulatory Visit (HOSPITAL_COMMUNITY): Payer: Self-pay | Admitting: Psychiatry

## 2010-10-23 DIAGNOSIS — Z560 Unemployment, unspecified: Secondary | ICD-10-CM | POA: Insufficient documentation

## 2010-10-23 DIAGNOSIS — F1920 Other psychoactive substance dependence, uncomplicated: Secondary | ICD-10-CM | POA: Insufficient documentation

## 2010-10-23 DIAGNOSIS — F329 Major depressive disorder, single episode, unspecified: Secondary | ICD-10-CM | POA: Insufficient documentation

## 2010-10-24 ENCOUNTER — Other Ambulatory Visit (HOSPITAL_COMMUNITY): Payer: PRIVATE HEALTH INSURANCE

## 2010-10-24 LAB — URINE DRUGS OF ABUSE SCREEN W ALC, ROUTINE (REF LAB)
Amphetamine Screen, Ur: NEGATIVE
Barbiturate Quant, Ur: NEGATIVE
Benzodiazepines.: NEGATIVE
Marijuana Metabolite: NEGATIVE
Opiate Screen, Urine: NEGATIVE
Propoxyphene: NEGATIVE

## 2010-10-26 ENCOUNTER — Other Ambulatory Visit (HOSPITAL_COMMUNITY): Payer: PRIVATE HEALTH INSURANCE

## 2010-10-29 ENCOUNTER — Other Ambulatory Visit (HOSPITAL_COMMUNITY): Payer: PRIVATE HEALTH INSURANCE

## 2010-10-30 ENCOUNTER — Other Ambulatory Visit (HOSPITAL_COMMUNITY): Payer: PRIVATE HEALTH INSURANCE

## 2010-10-31 ENCOUNTER — Other Ambulatory Visit (HOSPITAL_COMMUNITY): Payer: PRIVATE HEALTH INSURANCE

## 2010-11-02 ENCOUNTER — Other Ambulatory Visit (HOSPITAL_COMMUNITY): Payer: PRIVATE HEALTH INSURANCE

## 2010-11-05 ENCOUNTER — Other Ambulatory Visit (HOSPITAL_COMMUNITY): Payer: PRIVATE HEALTH INSURANCE

## 2010-11-07 ENCOUNTER — Other Ambulatory Visit (HOSPITAL_COMMUNITY): Payer: Self-pay | Admitting: Psychiatry

## 2010-11-07 ENCOUNTER — Other Ambulatory Visit (HOSPITAL_COMMUNITY): Payer: PRIVATE HEALTH INSURANCE

## 2010-11-08 LAB — URINE DRUGS OF ABUSE SCREEN W ALC, ROUTINE (REF LAB)
Amphetamine Screen, Ur: NEGATIVE
Barbiturate Quant, Ur: NEGATIVE
Benzodiazepines.: NEGATIVE
Ethyl Alcohol: <10 mg/dL (ref <10)
Marijuana Metabolite: NEGATIVE
Opiate Screen, Urine: NEGATIVE
Propoxyphene: NEGATIVE

## 2010-11-09 ENCOUNTER — Other Ambulatory Visit (HOSPITAL_COMMUNITY): Payer: PRIVATE HEALTH INSURANCE

## 2010-11-09 ENCOUNTER — Other Ambulatory Visit (HOSPITAL_COMMUNITY): Payer: Self-pay | Admitting: Psychiatry

## 2010-11-10 LAB — URINE DRUGS OF ABUSE SCREEN W ALC, ROUTINE (REF LAB)
Amphetamine Screen, Ur: NEGATIVE
Barbiturate Quant, Ur: NEGATIVE
Ethyl Alcohol: <10 mg/dL (ref <10)
Marijuana Metabolite: NEGATIVE
Opiate Screen, Urine: NEGATIVE
Propoxyphene: NEGATIVE

## 2010-11-12 ENCOUNTER — Other Ambulatory Visit (HOSPITAL_COMMUNITY): Payer: PRIVATE HEALTH INSURANCE

## 2010-11-14 ENCOUNTER — Other Ambulatory Visit (HOSPITAL_COMMUNITY): Payer: PRIVATE HEALTH INSURANCE

## 2010-11-16 ENCOUNTER — Other Ambulatory Visit (HOSPITAL_COMMUNITY): Payer: Self-pay | Admitting: Psychiatry

## 2010-11-16 ENCOUNTER — Other Ambulatory Visit (HOSPITAL_COMMUNITY): Payer: PRIVATE HEALTH INSURANCE

## 2010-11-17 LAB — URINE DRUGS OF ABUSE SCREEN W ALC, ROUTINE (REF LAB)
Amphetamine Screen, Ur: NEGATIVE
Barbiturate Quant, Ur: NEGATIVE
Ethyl Alcohol: <10 mg/dL (ref <10)
Marijuana Metabolite: NEGATIVE
Opiate Screen, Urine: NEGATIVE

## 2010-11-19 ENCOUNTER — Other Ambulatory Visit (HOSPITAL_COMMUNITY): Payer: Self-pay | Admitting: Psychiatry

## 2010-11-19 ENCOUNTER — Other Ambulatory Visit (HOSPITAL_COMMUNITY): Payer: PRIVATE HEALTH INSURANCE | Attending: Physician Assistant

## 2010-11-19 DIAGNOSIS — F329 Major depressive disorder, single episode, unspecified: Secondary | ICD-10-CM | POA: Insufficient documentation

## 2010-11-19 DIAGNOSIS — F1920 Other psychoactive substance dependence, uncomplicated: Secondary | ICD-10-CM | POA: Insufficient documentation

## 2010-11-19 DIAGNOSIS — Z560 Unemployment, unspecified: Secondary | ICD-10-CM | POA: Insufficient documentation

## 2010-11-20 LAB — URINE DRUGS OF ABUSE SCREEN W ALC, ROUTINE (REF LAB)
Amphetamine Screen, Ur: NEGATIVE
Barbiturate Quant, Ur: NEGATIVE
Creatinine,U: 36 mg/dL
Ethyl Alcohol: <10 mg/dL (ref <10)
Marijuana Metabolite: NEGATIVE
Opiate Screen, Urine: NEGATIVE

## 2010-11-21 ENCOUNTER — Other Ambulatory Visit (HOSPITAL_COMMUNITY): Payer: PRIVATE HEALTH INSURANCE

## 2010-11-23 ENCOUNTER — Other Ambulatory Visit (HOSPITAL_COMMUNITY): Payer: PRIVATE HEALTH INSURANCE

## 2010-11-26 ENCOUNTER — Other Ambulatory Visit (HOSPITAL_COMMUNITY): Payer: Self-pay | Admitting: Physician Assistant

## 2010-11-26 ENCOUNTER — Other Ambulatory Visit (HOSPITAL_COMMUNITY): Payer: PRIVATE HEALTH INSURANCE

## 2010-11-27 LAB — URINE DRUGS OF ABUSE SCREEN W ALC, ROUTINE (REF LAB)
Amphetamine Screen, Ur: NEGATIVE
Barbiturate Quant, Ur: NEGATIVE
Benzodiazepines.: NEGATIVE
Creatinine,U: 35.8 mg/dL
Ethyl Alcohol: <10 mg/dL (ref <10)
Marijuana Metabolite: NEGATIVE
Opiate Screen, Urine: NEGATIVE

## 2010-11-28 ENCOUNTER — Other Ambulatory Visit (HOSPITAL_COMMUNITY): Payer: PRIVATE HEALTH INSURANCE

## 2010-11-30 ENCOUNTER — Other Ambulatory Visit (HOSPITAL_COMMUNITY): Payer: PRIVATE HEALTH INSURANCE

## 2010-12-07 ENCOUNTER — Other Ambulatory Visit (HOSPITAL_COMMUNITY): Payer: Self-pay | Admitting: Physician Assistant

## 2010-12-07 ENCOUNTER — Other Ambulatory Visit (HOSPITAL_COMMUNITY): Payer: PRIVATE HEALTH INSURANCE

## 2010-12-08 LAB — URINE DRUGS OF ABUSE SCREEN W ALC, ROUTINE (REF LAB)
Amphetamine Screen, Ur: NEGATIVE
Benzodiazepines.: NEGATIVE
Creatinine,U: 93.7 mg/dL
Ethyl Alcohol: <10 mg/dL (ref <10)
Marijuana Metabolite: NEGATIVE
Opiate Screen, Urine: NEGATIVE

## 2010-12-10 ENCOUNTER — Other Ambulatory Visit (HOSPITAL_COMMUNITY): Payer: PRIVATE HEALTH INSURANCE

## 2010-12-10 ENCOUNTER — Other Ambulatory Visit (HOSPITAL_COMMUNITY): Payer: Self-pay | Admitting: Physician Assistant

## 2010-12-11 LAB — URINE DRUGS OF ABUSE SCREEN W ALC, ROUTINE (REF LAB)
Barbiturate Quant, Ur: NEGATIVE
Cocaine Metabolites: NEGATIVE
Methadone: NEGATIVE
Phencyclidine (PCP): NEGATIVE
Propoxyphene: NEGATIVE

## 2010-12-12 ENCOUNTER — Other Ambulatory Visit (HOSPITAL_COMMUNITY): Payer: PRIVATE HEALTH INSURANCE

## 2010-12-14 ENCOUNTER — Other Ambulatory Visit (HOSPITAL_COMMUNITY): Payer: PRIVATE HEALTH INSURANCE

## 2010-12-17 ENCOUNTER — Other Ambulatory Visit (HOSPITAL_COMMUNITY): Payer: PRIVATE HEALTH INSURANCE

## 2010-12-19 ENCOUNTER — Other Ambulatory Visit (HOSPITAL_COMMUNITY): Payer: PRIVATE HEALTH INSURANCE | Attending: Physician Assistant

## 2010-12-19 DIAGNOSIS — F1920 Other psychoactive substance dependence, uncomplicated: Secondary | ICD-10-CM | POA: Insufficient documentation

## 2010-12-19 DIAGNOSIS — Z560 Unemployment, unspecified: Secondary | ICD-10-CM | POA: Insufficient documentation

## 2010-12-19 DIAGNOSIS — F329 Major depressive disorder, single episode, unspecified: Secondary | ICD-10-CM | POA: Insufficient documentation

## 2010-12-21 ENCOUNTER — Other Ambulatory Visit (HOSPITAL_COMMUNITY): Payer: PRIVATE HEALTH INSURANCE | Attending: Physician Assistant

## 2010-12-24 ENCOUNTER — Other Ambulatory Visit (HOSPITAL_COMMUNITY): Payer: PRIVATE HEALTH INSURANCE | Admitting: *Deleted

## 2010-12-26 ENCOUNTER — Other Ambulatory Visit (HOSPITAL_COMMUNITY): Payer: PRIVATE HEALTH INSURANCE

## 2010-12-28 ENCOUNTER — Other Ambulatory Visit (HOSPITAL_COMMUNITY): Payer: PRIVATE HEALTH INSURANCE

## 2010-12-31 ENCOUNTER — Other Ambulatory Visit (HOSPITAL_COMMUNITY): Payer: PRIVATE HEALTH INSURANCE

## 2011-01-03 ENCOUNTER — Emergency Department (HOSPITAL_COMMUNITY)
Admission: EM | Admit: 2011-01-03 | Discharge: 2011-01-03 | Disposition: A | Discharge status: Home / Self Care | Payer: PRIVATE HEALTH INSURANCE | Attending: Emergency Medicine | Admitting: Emergency Medicine

## 2011-01-03 ENCOUNTER — Emergency Department (HOSPITAL_COMMUNITY): Payer: PRIVATE HEALTH INSURANCE

## 2011-01-03 DIAGNOSIS — R140 Abdominal distension (gaseous): Secondary | ICD-10-CM

## 2011-01-03 DIAGNOSIS — R221 Localized swelling, mass and lump, neck: Secondary | ICD-10-CM | POA: Insufficient documentation

## 2011-01-03 DIAGNOSIS — K219 Gastro-esophageal reflux disease without esophagitis: Secondary | ICD-10-CM | POA: Insufficient documentation

## 2011-01-03 DIAGNOSIS — R141 Gas pain: Secondary | ICD-10-CM | POA: Insufficient documentation

## 2011-01-03 DIAGNOSIS — Z9889 Other specified postprocedural states: Secondary | ICD-10-CM | POA: Insufficient documentation

## 2011-01-03 DIAGNOSIS — R142 Eructation: Secondary | ICD-10-CM | POA: Insufficient documentation

## 2011-01-03 DIAGNOSIS — F17200 Nicotine dependence, unspecified, uncomplicated: Secondary | ICD-10-CM | POA: Insufficient documentation

## 2011-01-03 DIAGNOSIS — K1370 Unspecified lesions of oral mucosa: Secondary | ICD-10-CM | POA: Insufficient documentation

## 2011-01-03 DIAGNOSIS — K140 Glossitis: Secondary | ICD-10-CM | POA: Insufficient documentation

## 2011-01-03 DIAGNOSIS — R220 Localized swelling, mass and lump, head: Secondary | ICD-10-CM | POA: Insufficient documentation

## 2011-01-03 DIAGNOSIS — K146 Glossodynia: Secondary | ICD-10-CM | POA: Insufficient documentation

## 2011-01-03 DIAGNOSIS — R209 Unspecified disturbances of skin sensation: Secondary | ICD-10-CM | POA: Insufficient documentation

## 2011-01-03 DIAGNOSIS — K121 Other forms of stomatitis: Secondary | ICD-10-CM

## 2011-01-03 DIAGNOSIS — R109 Unspecified abdominal pain: Secondary | ICD-10-CM | POA: Insufficient documentation

## 2011-01-03 DIAGNOSIS — Z79899 Other long term (current) drug therapy: Secondary | ICD-10-CM | POA: Insufficient documentation

## 2011-01-03 HISTORY — DX: Gastro-esophageal reflux disease without esophagitis: K21.9

## 2011-01-03 LAB — CBC
MCH: 27.1 pg (ref 26.0–34.0)
MCV: 84 fL (ref 78.0–100.0)
Platelets: 386 10*3/uL (ref 150–400)
RBC: 4.25 MIL/uL (ref 3.87–5.11)

## 2011-01-03 LAB — URINALYSIS, ROUTINE W REFLEX MICROSCOPIC
Bilirubin Urine: NEGATIVE
Hgb urine dipstick: NEGATIVE
Ketones, ur: NEGATIVE mg/dL
Specific Gravity, Urine: 1.012 (ref 1.005–1.030)
Urobilinogen, UA: 0.2 mg/dL (ref 0.0–1.0)

## 2011-01-03 LAB — COMPREHENSIVE METABOLIC PANEL
CO2: 27 mEq/L (ref 19–32)
Calcium: 9.2 mg/dL (ref 8.4–10.5)
Creatinine, Ser: 0.73 mg/dL (ref 0.50–1.10)
GFR calc Af Amer: >90 mL/min (ref >90)
GFR calc non Af Amer: >90 mL/min (ref >90)
Glucose, Bld: 85 mg/dL (ref 70–99)

## 2011-01-03 MED ORDER — MAGIC MOUTHWASH W/LIDOCAINE
5.0000 mL | ORAL | Status: AC
  Administered 2011-01-03: 5 mL via ORAL
  Filled 2011-01-03: qty 5

## 2011-01-03 NOTE — ED Notes (Signed)
Pt. Reports having gum pain and sores spots on her tongue for 2 months, abdominal bloating began about 3 weeks ago, ABdomein is non-tender and soft, pt. Reports feeling very tired, having tingling in her hands and she has gained 20 lbs since July

## 2011-01-03 NOTE — ED Notes (Addendum)
Diagnosis given to pt per physician.  Pt provided with discharge instructions and resources to obtain a PCP.

## 2011-01-03 NOTE — ED Notes (Addendum)
Gait steady NAD, resp equal/nonlabored. Significant other at [facility].

## 2011-01-03 NOTE — ED Provider Notes (Signed)
History     CSN: [id number] Arrival date & time: 01/03/2011  5:00 PM   First MD Initiated Contact with Patient 01/03/11 1905      Chief Complaint  Patient presents with  . Abdominal Pain    abdominal bloating    (Consider location/radiation/quality/duration/timing/severity/associated sxs/prior treatment) Patient is a 40 y.o. female presenting with abdominal pain. The history is provided by the patient.  Abdominal Pain The primary symptoms of the illness include abdominal pain. The primary symptoms of the illness do not include fever, fatigue, shortness of breath, nausea, vomiting, diarrhea, hematemesis, hematochezia, dysuria, vaginal discharge or vaginal bleeding. The onset of the illness was gradual. The problem has been gradually worsening.  The patient states that she believes she is currently not pregnant. The patient has not had a change in bowel habit. Symptoms associated with the illness do not include chills, anorexia, heartburn, constipation, urgency, hematuria, frequency or back pain.  Pt states over last two months she has had swelling of gums, and mouth sores. States sores are tender, currently has one on her tongue. Also reports weakness and abdominal swelling for the last month. States worsened in the last two weeks. Denies fever, chills, nausea, vomiting, URI symptoms, dysuria, change in bowels, being pregnant. States gained 20lb in the last 2 months. Has intermittent tingling sensation in bilateral hands.  Went to her periodontist today who told her she needs to come to ER.  Past Medical History  Diagnosis Date  . GERD (gastroesophageal reflux disease)     Past Surgical History  Procedure Date  . Cholecystectomy     Family History  Problem Relation Age of Onset  . Cancer Mother   . Thyroid disease Mother   . Diabetes Father     History  Substance Use Topics  . Smoking status: Current Everyday Smoker -- 1.0 packs/day    Types: Cigarettes  . Smokeless tobacco:  Not on file  . Alcohol Use: No    OB History    Grav Para Term Preterm Abortions TAB SAB Ect Mult Living                  Review of Systems  Constitutional: Positive for unexpected weight change. Negative for fever, chills, activity change, appetite change and fatigue.  HENT: Positive for mouth sores and dental problem. Negative for facial swelling.   Eyes: Negative.   Respiratory: Negative.  Negative for shortness of breath.   Cardiovascular: Negative.   Gastrointestinal: Positive for abdominal pain and abdominal distention. Negative for heartburn, nausea, vomiting, diarrhea, constipation, hematochezia, anorexia and hematemesis.  Genitourinary: Negative for dysuria, urgency, frequency, hematuria, flank pain, vaginal bleeding, vaginal discharge and difficulty urinating.  Musculoskeletal: Negative.  Negative for back pain.  Skin: Negative.   Neurological: Positive for numbness. Negative for speech difficulty, weakness and headaches.    Allergies  Review of patient's allergies indicates no known allergies.  Home Medications   Current Outpatient Rx  Name Route Sig Dispense Refill  . AMOXICILLIN 500 MG PO CAPS Oral Take 500 mg by mouth 4 (four) times daily.      Marland Kitchen ESOMEPRAZOLE MAGNESIUM 40 MG PO CPDR Oral Take 40 mg by mouth daily before breakfast.      . IBUPROFEN 200 MG PO TABS Oral Take 800 mg by mouth every 6 (six) hours as needed. For pain     . THERA M PLUS PO TABS Oral Take 1 tablet by mouth daily.      . QUETIAPINE FUMARATE 100 MG  PO TABS Oral Take 150 mg by mouth at bedtime.      . VENLAFAXINE HCL 75 MG PO TABS Oral Take 75 mg by mouth 3 (three) times daily.        BP 119/70  Pulse 88  Temp(Src) 98.4 F (36.9 C) (Oral)  Resp 16  Ht 5' (1.524 m)  Wt 155 lb (70.308 kg)  BMI 30.27 kg/m2  SpO2 98%  Physical Exam  Constitutional: She is oriented to person, place, and time. She appears well-developed and well-nourished. No distress.  HENT:  Head: Normocephalic and  atraumatic.       Normal dentition, no gum disease noted. Small ulceration to the right tip of the tongue, tender.  Eyes: Conjunctivae and EOM are normal. Pupils are equal, round, and reactive to light.  Neck: Neck supple.  Cardiovascular: Normal rate, regular rhythm and normal heart sounds.   Pulmonary/Chest: Breath sounds normal. No respiratory distress. She has no wheezes.  Abdominal: Soft. Bowel sounds are normal. She exhibits no distension and no mass. There is no tenderness. There is no guarding.  Musculoskeletal: Normal range of motion. She exhibits no edema.  Neurological: She is alert and oriented to person, place, and time. She has normal reflexes.    ED Course  Procedures (including critical care time)  Pt in NAD. Small sore to the tongue. Abdomen soft, non tender, obese. VS normal. Normal labs, negative acute abdominal series. Will d/c home with follow up. Results discussed with pt and recommended close follow up for recheck of the mouth sore and her weight gain.    MDM          Lottie Mussel, PA 01/03/11 2302

## 2011-01-03 NOTE — ED Provider Notes (Signed)
Medical screening examination/treatment/procedure(s) were performed by non-physician practitioner and as supervising physician I was immediately available for consultation/collaboration.   Dontreal Miera R. Seira Cody, MD 01/03/11 2341 

## 2011-01-24 ENCOUNTER — Ambulatory Visit (INDEPENDENT_AMBULATORY_CARE_PROVIDER_SITE_OTHER): Payer: PRIVATE HEALTH INSURANCE | Admitting: Physician Assistant

## 2011-01-24 DIAGNOSIS — F39 Unspecified mood [affective] disorder: Secondary | ICD-10-CM

## 2011-01-24 MED ORDER — TRAZODONE HCL 50 MG PO TABS: 50.0000 mg | ORAL_TABLET | Freq: Every day | ORAL | Status: DC

## 2011-01-30 ENCOUNTER — Encounter (HOSPITAL_COMMUNITY): Payer: Self-pay | Admitting: Physician Assistant

## 2011-02-25 ENCOUNTER — Encounter (HOSPITAL_COMMUNITY): Payer: Self-pay | Admitting: *Deleted

## 2011-02-25 ENCOUNTER — Emergency Department (INDEPENDENT_AMBULATORY_CARE_PROVIDER_SITE_OTHER)
Admission: EM | Admit: 2011-02-25 | Discharge: 2011-02-25 | Disposition: A | Discharge status: Home / Self Care | Payer: 59 | Source: Home / Self Care | Attending: Family Medicine | Admitting: Family Medicine

## 2011-02-25 DIAGNOSIS — G44209 Tension-type headache, unspecified, not intractable: Secondary | ICD-10-CM

## 2011-02-25 DIAGNOSIS — K2970 Gastritis, unspecified, without bleeding: Secondary | ICD-10-CM

## 2011-02-25 DIAGNOSIS — K2990 Gastroduodenitis, unspecified, without bleeding: Secondary | ICD-10-CM

## 2011-02-25 MED ORDER — METAXALONE 800 MG PO TABS: 800.0000 mg | ORAL_TABLET | Freq: Three times a day (TID) | ORAL | Status: AC

## 2011-02-25 MED ORDER — GI COCKTAIL ~~LOC~~
30.0000 mL | Freq: Once | ORAL | Status: AC
  Administered 2011-02-25: 30 mL via ORAL

## 2011-02-25 MED ORDER — GI COCKTAIL ~~LOC~~
ORAL | Status: AC
  Filled 2011-02-25: qty 30

## 2011-02-25 NOTE — ED Notes (Signed)
Pt  Reports  Headache  Neck pain      Teeth  Clenching    X   Almost  12 days    denys  Any  Injury  Sitting  Up  U[pright on  Exam  Table   In good  Spirits  She  Also  Reports   Fatigue  At  About the  Same  Time

## 2011-02-25 NOTE — ED Provider Notes (Signed)
History     CSN: [id number]  Arrival date & time 02/25/11  5621   First MD Initiated Contact with Patient 02/25/11 2101      Chief Complaint  Patient presents with  . Headache    (Consider location/radiation/quality/duration/timing/severity/associated sxs/prior treatment) Patient is a 41 y.o. female presenting with headaches. The history is provided by the patient.  Headache The primary symptoms include headaches. Primary symptoms do not include seizures or dizziness. The symptoms began more than 1 week ago. The symptoms are unchanged. Context: comes back daily since 12/26.  The headache is not associated with neck stiffness or weakness.  Additional symptoms include pain. Additional symptoms do not include neck stiffness or weakness.    Past Medical History  Diagnosis Date  . GERD (gastroesophageal reflux disease)     Past Surgical History  Procedure Date  . Cholecystectomy     Family History  Problem Relation Age of Onset  . Cancer Mother   . Thyroid disease Mother   . Diabetes Father     History  Substance Use Topics  . Smoking status: Current Everyday Smoker -- 1.0 packs/day    Types: Cigarettes  . Smokeless tobacco: Not on file  . Alcohol Use: No    OB History    Grav Para Term Preterm Abortions TAB SAB Ect Mult Living                  Review of Systems  Constitutional: Negative.   HENT: Positive for neck pain. Negative for facial swelling and neck stiffness.   Neurological: Positive for headaches. Negative for dizziness, seizures, syncope, facial asymmetry, speech difficulty, weakness, light-headedness and numbness.    Allergies  Review of patient's allergies indicates no known allergies.  Home Medications   Current Outpatient Rx  Name Route Sig Dispense Refill  . AMOXICILLIN 500 MG PO CAPS Oral Take 500 mg by mouth 4 (four) times daily.      Marland Kitchen ESOMEPRAZOLE MAGNESIUM 40 MG PO CPDR Oral Take 40 mg by mouth daily before breakfast.      . IBUPROFEN  200 MG PO TABS Oral Take 800 mg by mouth every 6 (six) hours as needed. For pain     . METAXALONE 800 MG PO TABS Oral Take 1 tablet (800 mg total) by mouth 3 (three) times daily. 21 tablet 0  . THERA M PLUS PO TABS Oral Take 1 tablet by mouth daily.      . QUETIAPINE FUMARATE 100 MG PO TABS Oral Take 150 mg by mouth at bedtime.      . TRAZODONE HCL 50 MG PO TABS Oral Take 1 tablet (50 mg total) by mouth at bedtime. 30 tablet 1  . VENLAFAXINE HCL 75 MG PO TABS Oral Take 75 mg by mouth 3 (three) times daily.        BP 110/65  Pulse 92  Temp(Src) 97.7 F (36.5 C) (Oral)  Resp 16  SpO2 100%  LMP 02/15/2011  Physical Exam  Nursing note and vitals reviewed. Constitutional: She is oriented to person, place, and time. She appears well-developed and well-nourished.  Musculoskeletal: Normal range of motion. She exhibits tenderness.       Cervical back: She exhibits tenderness and spasm. She exhibits normal range of motion, no bony tenderness, no swelling, no edema and no deformity.  Neurological: She is alert and oriented to person, place, and time. She has normal strength. No cranial nerve deficit or sensory deficit.  Skin: Skin is warm and dry.  Psychiatric: She has a normal mood and affect.    ED Course  Procedures (including critical care time)  Labs Reviewed - No data to display No results found.   1. Tension type headache   2. Gastritis       MDM          Barkley Bruns, MD 02/25/11 2119

## 2011-03-04 ENCOUNTER — Ambulatory Visit (INDEPENDENT_AMBULATORY_CARE_PROVIDER_SITE_OTHER): Payer: PRIVATE HEALTH INSURANCE | Admitting: Physician Assistant

## 2011-03-04 DIAGNOSIS — F39 Unspecified mood [affective] disorder: Secondary | ICD-10-CM

## 2011-03-04 NOTE — Progress Notes (Signed)
   [facility] Behavioral Health Follow-up Outpatient Visit  Sarah Barry 1970-03-11  Date: 03/04/11   Subjective: Sarah Barry comes for followup today on her medications prescribed for her mood and insomnia. At her last visit we started her on trazodone 50 mg to be taken nightly as needed. She was instructed to adjust that dose to between 25 mg and 100 mg as best provided relief for insomnia. She reports that even at 25 mg of trazodone she felt groggy throughout the next day. She tried taking it with and without the Seroquel and tried reducing the amount of Seroquel to 50 mg a night, but no matter what she tried he continued to have next day somnolence. She also complains of having developed headaches, which she was evaluated for at an urgent care facility. She was told that they will are stress headaches. She endorses grinding her teeth during the day. She is under significant stress now as she is having to care for her mother and her grandmother. She reports that she can fall asleep okay, without medication, but she wakes every hour to 2 hours throughout the night. She reports that she is able to fall asleep quickly, but is bothered by the fact that she wakes up so often. She reports that she has begun to take multiple over-the-counter dietary supplements, including fiber, magnesium B complex, multivitamin, green coffee bean, and raspberry ketone. She denies any suicidal or homicidal ideation. She denies any auditory or visual hallucinations. She continues to maintain abstinence from substances of abuse. She is attending 12 step meetings and working with a sponsor. She will soon get her driver's license back and obtain an automobile.  There were no vitals filed for this visit.  Mental Status Examination  Appearance: Casual Alert: Yes Attention: good  Cooperative: Yes Eye Contact: Good Speech: Clear and even Psychomotor Activity: Increased Memory/Concentration: Intact Oriented: person, place, time/date  and situation Mood: Euthymic Affect: Congruent Thought Processes and Associations: Linear Fund of Knowledge: Fair Thought Content:  Insight: Fair Judgement: Good  Diagnosis: Mood disorder, not otherwise specified  Treatment Plan: We will continue her Effexor XR at 75 mg and refer her for a sleep study.  Yan Pankratz, PA

## 2011-03-20 ENCOUNTER — Ambulatory Visit (HOSPITAL_BASED_OUTPATIENT_CLINIC_OR_DEPARTMENT_OTHER): Payer: PRIVATE HEALTH INSURANCE | Attending: Physician Assistant | Admitting: Radiology

## 2011-03-20 VITALS — Ht 60.0 in | Wt 155.0 lb

## 2011-03-20 DIAGNOSIS — G4700 Insomnia, unspecified: Secondary | ICD-10-CM | POA: Insufficient documentation

## 2011-03-20 DIAGNOSIS — G4733 Obstructive sleep apnea (adult) (pediatric): Secondary | ICD-10-CM

## 2011-03-20 DIAGNOSIS — F39 Unspecified mood [affective] disorder: Secondary | ICD-10-CM

## 2011-03-20 DIAGNOSIS — G4730 Sleep apnea, unspecified: Secondary | ICD-10-CM | POA: Insufficient documentation

## 2011-03-26 DIAGNOSIS — G4761 Periodic limb movement disorder: Secondary | ICD-10-CM

## 2011-03-26 DIAGNOSIS — G4730 Sleep apnea, unspecified: Secondary | ICD-10-CM

## 2011-03-26 DIAGNOSIS — G4710 Hypersomnia, unspecified: Secondary | ICD-10-CM

## 2011-03-27 NOTE — Procedures (Signed)
NAMEMARVIS, Sarah Barry                 ACCOUNT NO.:  [id number]  MEDICAL RECORD NO.:  [id number]          PATIENT TYPE:  OUT  LOCATION:  SLEEP CENTER                 FACILITY:  [facility]  PHYSICIAN:  Clinton D. Maple Hudson, MD, FCCP, FACPDATE OF BIRTH:  Dec 10, 1969  DATE OF STUDY:  03/20/2011                           NOCTURNAL POLYSOMNOGRAM  REFERRING PHYSICIAN:  Dineen Kid. Reche Dixon, M.D.  REFERRING PHYSICIAN:  Dineen Kid. Reche Dixon, M.D.  INDICATION FOR STUDY:  Insomnia with sleep apnea.  EPWORTH SLEEPINESS SCORE:  15/24.  BMI 30.3, weight 155 pounds, height 60 inches, neck 13 inches.  MEDICATIONS:  Home medications are charted and reviewed.  SLEEP ARCHITECTURE:  Total sleep time 336.5 minutes with sleep efficiency 90.3%.  Stage I was 5.8%, stage II 78.6%, stage III 7.4%, REM 8.2% of total sleep time.  Sleep latency 13.5 minutes, REM latency 257 minutes, awake after sleep onset 22 minutes.  Arousal index 22.8.  BEDTIME MEDICATION:  None.  RESPIRATORY DATA:  Apnea-hypopnea index (AHI) 3 per hour.  A total of 17 events was scored including 4 obstructive apneas, 9 central apneas, 3 mixed apneas, 1 hypopnea.  Events were not positional and not associated with REM.  REM AHI 0 per hour.  There were insufficient numbers of events to permit application of split protocol, CPAP titration on this study night.  OXYGEN DATA:  Moderately loud snoring with oxygen desaturation to a nadir of 93% and a mean oxygen saturation through the study of 95.7% on room air.  CARDIAC DATA:  Normal sinus rhythm.  MOVEMENT-PARASOMNIA:  Period limb movements.  A total of 61 limb jerks were counted, of which 12 were associated with arousals or awakening for periodic limb movement with arousal index of 2.1 per hour.  Bathroom x1.  IMPRESSIONS-RECOMMENDATIONS: 1. Sleep architecture not clearly abnormal for unfamiliar environment,     however total percentage time spent in rapid eye movement/ REM was     reduced from adult  averages.  In a stable situation, this pattern     is sometimes seen with depression. 2. Occasional respiratory event with sleep disturbance, within normal     limits.  Apnea-hypopnea index 3 per hour (the normal range for     adults is from 0-5 events per hour).  Moderately loud snoring with     oxygen desaturation to a nadir of 93% and a mean oxygen saturation     through the study of 95.7% on room air. 3. Periodic limb movement with arousal.  A total of 61 limb jerks were     counted of which 12 were associated with arousal or awakening for     periodic limb movement with arousal index of 2.1 per hour.  If this     is a persistent pattern in the home environment associated with     significant sleep disturbance, then a trial of specific therapy     such as ReQuip or Mirapex might be considered if clinically     appropriate.     Clinton D. Maple Hudson, MD, FCCP, FACP Diplomate, Biomedical engineer of Sleep Medicine Electronically Signed    CDY/MEDQ  D:  03/26/2011 12:42:56  T:  03/27/2011  04:01:02  Job:  [id number]

## 2011-04-04 ENCOUNTER — Ambulatory Visit (HOSPITAL_COMMUNITY): Payer: PRIVATE HEALTH INSURANCE | Admitting: Physician Assistant

## 2011-04-25 ENCOUNTER — Ambulatory Visit (INDEPENDENT_AMBULATORY_CARE_PROVIDER_SITE_OTHER): Payer: PRIVATE HEALTH INSURANCE | Admitting: Physician Assistant

## 2011-04-25 DIAGNOSIS — F319 Bipolar disorder, unspecified: Secondary | ICD-10-CM

## 2011-04-25 MED ORDER — VENLAFAXINE HCL 75 MG PO TABS: 150.0000 mg | ORAL_TABLET | Freq: Two times a day (BID) | ORAL | Status: DC

## 2011-04-25 MED ORDER — AMITRIPTYLINE HCL 10 MG PO TABS: ORAL_TABLET | ORAL | Status: DC

## 2011-04-25 NOTE — Progress Notes (Signed)
   [facility] Behavioral Health Follow-up Outpatient Visit  KAYREN HOLCK Mar 24, 1970  Date: 04/25/11   Subjective: Sarah Barry presents today to follow up on her medications for bipolar disorder. She reports that her mood is still up and down, and she continues to have very low energy. Also, she continues to have difficulties in maintaining sleep. She underwent the sleep study which ruled out sleep apnea but detected a moderate amount of limb movement during the night. She reports that she stopped taking the trazodone because of day after somnolence. She reports that Seroquel causes her to sleep too deeply, and she needs to be able to wake and care for her mother. She continues to maintain abstinence from substances of abuse, and happily reports that she is coming up on 6 months of sobriety. She continues to attend 12-step meetings on a daily basis, is working with a sponsor, and has begun attending a step workshop on a weekly basis. She was recently prescribed promethazine and Fioricet for headaches. I discussed these medications and warned her of the potential for abuse and dependence. She denies any suicidal or homicidal ideation. She denies any auditory or visual hallucinations.  There were no vitals filed for this visit.  Mental Status Examination  Appearance: Well groomed and casually dressed Alert: Yes Attention: good  Cooperative: Yes Eye Contact: Good Speech: Clear and even Psychomotor Activity: Normal Memory/Concentration: Intact Oriented: person, place, time/date and situation Mood: Euthymic Affect: Appropriate Thought Processes and Associations: Linear Fund of Knowledge: Good Thought Content:  Insight: Good Judgement: Good  Diagnosis: Bipolar disorder, type 1, mixed  Treatment Plan: We will discontinue her trazodone and Seroquel.  We will start her on amitriptyline 10 mg, and allow her to adjust the dose up to 30 mg to aid in sleep. We will also increase her Effexor to 150 mg twice  daily. She will followup in one month.  Roshaunda Starkey, PA

## 2011-05-14 ENCOUNTER — Other Ambulatory Visit (HOSPITAL_COMMUNITY): Payer: Self-pay | Admitting: Physician Assistant

## 2011-05-16 ENCOUNTER — Other Ambulatory Visit (HOSPITAL_COMMUNITY): Payer: Self-pay | Admitting: Physician Assistant

## 2011-05-23 ENCOUNTER — Ambulatory Visit (INDEPENDENT_AMBULATORY_CARE_PROVIDER_SITE_OTHER): Payer: PRIVATE HEALTH INSURANCE | Admitting: Physician Assistant

## 2011-05-23 DIAGNOSIS — F319 Bipolar disorder, unspecified: Secondary | ICD-10-CM

## 2011-05-23 MED ORDER — ESOMEPRAZOLE MAGNESIUM 40 MG PO CPDR: 40.0000 mg | DELAYED_RELEASE_CAPSULE | Freq: Every day | ORAL | Status: DC

## 2011-06-19 ENCOUNTER — Other Ambulatory Visit (HOSPITAL_COMMUNITY): Payer: Self-pay | Admitting: Physician Assistant

## 2011-06-19 DIAGNOSIS — F319 Bipolar disorder, unspecified: Secondary | ICD-10-CM

## 2011-06-26 ENCOUNTER — Other Ambulatory Visit: Payer: Self-pay | Admitting: Family Medicine

## 2011-06-28 ENCOUNTER — Other Ambulatory Visit (HOSPITAL_COMMUNITY): Payer: Self-pay | Admitting: Family Medicine

## 2011-06-28 DIAGNOSIS — R140 Abdominal distension (gaseous): Secondary | ICD-10-CM

## 2011-07-03 ENCOUNTER — Ambulatory Visit (HOSPITAL_COMMUNITY)
Admission: RE | Admit: 2011-07-03 | Discharge: 2011-07-03 | Disposition: A | Discharge status: Home / Self Care | Payer: Self-pay | Source: Ambulatory Visit | Attending: Family Medicine | Admitting: Family Medicine

## 2011-07-03 DIAGNOSIS — R141 Gas pain: Secondary | ICD-10-CM | POA: Insufficient documentation

## 2011-07-03 DIAGNOSIS — R142 Eructation: Secondary | ICD-10-CM | POA: Insufficient documentation

## 2011-07-03 DIAGNOSIS — R140 Abdominal distension (gaseous): Secondary | ICD-10-CM

## 2011-07-03 NOTE — Progress Notes (Signed)
   [facility] Behavioral Health Follow-up Outpatient Visit  Sarah Barry 19-Nov-1970  Date: 05/23/2011   Subjective: Sarah Barry presents today to followup on medications prescribed for bipolar disorder. She reports that her mood is "fairly good." She endorses that her mood is "not that great" but does not want to take medications for sleep. She is an exercising more and eating a better diet. She endorses maintaining abstinence from substances of abuse. She denies any suicidal or homicidal ideation. She denies any auditory or visual hallucinations.  There were no vitals filed for this visit.  Mental Status Examination  Appearance: Fairly groomed and casually dressed Alert: Yes Attention: good  Cooperative: Yes Eye Contact: Good Speech: Clear and even Psychomotor Activity: Normal Memory/Concentration: Intact Oriented: person, place, time/date and situation Mood: Euthymic Affect: Congruent Thought Processes and Associations: Linear Fund of Knowledge: Good Thought Content: Normal Insight: Good Judgement: Good  Diagnosis: Bipolar 1  Treatment Plan: We will continue her Effexor 150 mg twice daily, and amitriptyline 10-30 mg at bedtime for sleep. Her trazodone has been discontinued. She will followup in 2 months.  Gaelan Glennon, PA-C

## 2011-07-23 ENCOUNTER — Encounter (HOSPITAL_COMMUNITY): Payer: Self-pay | Admitting: Physician Assistant

## 2011-07-23 ENCOUNTER — Ambulatory Visit (INDEPENDENT_AMBULATORY_CARE_PROVIDER_SITE_OTHER): Payer: Self-pay | Admitting: Physician Assistant

## 2011-07-23 DIAGNOSIS — F319 Bipolar disorder, unspecified: Secondary | ICD-10-CM

## 2011-07-23 MED ORDER — VENLAFAXINE HCL 100 MG PO TABS: 200.0000 mg | ORAL_TABLET | Freq: Two times a day (BID) | ORAL | Status: DC

## 2011-07-23 NOTE — Progress Notes (Signed)
   [facility] Behavioral Health Follow-up Outpatient Visit  Sarah Barry Jul 03, 1970  Date: 07/23/2011   Subjective: [name] presents today to followup on medications prescribed for her bipolar disorder. She reports that her mood is fairly good, but she is tired all the time and sleeps excessively. She endorses sleeping 12-15 hours daily. Her primary care provider has told her that she was anemic and has a bacterial infection in her stomach. She recently purchased a iron and vitamin B12 supplement. She continues to take the Effexor or 150 mg twice daily, and she has stopped taking the Elavil. She reports that she quit smoking 2 weeks ago. Her appetite is good and she is eating healthier, but she complains that she is gaining weight. She also reports she has a great job at [employer]. She is attending meetings twice daily, sometimes more. Her sponsor recently do to an inability to relate with her substance use. She denies that she is depressed. She denies any suicidal or homicidal ideation. She denies any auditory or visual hallucinations.  There were no vitals filed for this visit.  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 She is encouraged to increase her exercise level to a minimum of 30 minutes daily.  Delonna Ney, PA-C

## 2011-08-08 ENCOUNTER — Encounter (HOSPITAL_COMMUNITY): Payer: Self-pay | Admitting: Physician Assistant

## 2011-08-08 NOTE — Progress Notes (Deleted)
Psychiatric Assessment Adult  Patient Identification:  Sarah Barry Date of Evaluation:  01/24/2011 Chief Complaint: Depression History of Chief Complaint:   Chief Complaint  Patient presents with  . Depression  . Drug Problem  . Establish Care    HPI Review of Systems Physical Exam  Depressive Symptoms: {DEPRESSION SYMPTOMS:20000}  (Hypo) Manic Symptoms:   Elevated Mood:  {BHH YES OR NO:22294} Irritable Mood:  {BHH YES OR NO:22294} Grandiosity:  {BHH YES OR NO:22294} Distractibility:  {BHH YES OR NO:22294} Labiality of Mood:  {BHH YES OR NO:22294} Delusions:  {BHH YES OR NO:22294} Hallucinations:  {BHH YES OR NO:22294} Impulsivity:  {BHH YES OR NO:22294} Sexually Inappropriate Behavior:  {BHH YES OR NO:22294} Financial Extravagance:  {BHH YES OR NO:22294} Flight of Ideas:  {BHH YES OR NO:22294}  Anxiety Symptoms: Excessive Worry:  {BHH YES OR NO:22294} Panic Symptoms:  {BHH YES OR NO:22294} Agoraphobia:  {BHH YES OR NO:22294} Obsessive Compulsive: {BHH YES OR NO:22294}  Symptoms: {Obsessive Compulsive Symptoms:22671} Specific Phobias:  {BHH YES OR NO:22294} Social Anxiety:  {BHH YES OR NO:22294}  Psychotic Symptoms:  Hallucinations: {BHH YES OR NO:22294} {Hallucinations:22672} Delusions:  {BHH YES OR NO:22294} Paranoia:  {BHH YES OR NO:22294}   Ideas of Reference:  {BHH YES OR NO:22294}  PTSD Symptoms: Ever had a traumatic exposure:  {BHH YES OR NO:22294} Had a traumatic exposure in the last month:  {BHH YES OR NO:22294} Re-experiencing: {BHH YES OR NO:22294} {Re-experiencing:22673} Hypervigilance:  {BHH YES OR NO:22294} Hyperarousal: {BHH YES OR NO:22294} {Hyperarousal:22674} Avoidance: {BHH YES OR NO:22294} {Avoidance:22675}  Traumatic Brain Injury: {BHH YES OR NO:22294} {Traumatic Brain Injury:22676}  Past Psychiatric History: Diagnosis: ***  Hospitalizations: ***  Outpatient Care: ***  Substance Abuse Care: ***  Self-Mutilation: ***  Suicidal  Attempts: ***  Violent Behaviors: ***   Past Medical History:   Past Medical History  Diagnosis Date  . GERD (gastroesophageal reflux disease)    History of Loss of Consciousness:  {BHH YES OR NO:22294} Seizure History:  {BHH YES OR NO:22294} Cardiac History:  {BHH YES OR NO:22294} Allergies:  No Known Allergies Current Medications:  Current Outpatient Prescriptions  Medication Sig Dispense Refill  . amitriptyline (ELAVIL) 10 MG tablet Take one to three tablets at bedtime for sleep  90 tablet  0  . amoxicillin (AMOXIL) 500 MG capsule Take 500 mg by mouth 4 (four) times daily.        Marland Kitchen esomeprazole (NEXIUM) 40 MG capsule Take 1 capsule (40 mg total) by mouth daily before breakfast.  90 capsule  3  . ibuprofen (ADVIL,MOTRIN) 200 MG tablet Take 800 mg by mouth every 6 (six) hours as needed. For pain       . Multiple Vitamins-Minerals (MULTIVITAMINS THER. W/MINERALS) TABS Take 1 tablet by mouth daily.        . traZODone (DESYREL) 50 MG tablet Take 1 tablet (50 mg total) by mouth at bedtime.  30 tablet  1  . venlafaxine (EFFEXOR) 100 MG tablet Take 2 tablets (200 mg total) by mouth 2 (two) times daily.  120 tablet  1  . DISCONTD: QUEtiapine (SEROQUEL) 100 MG tablet Take 150 mg by mouth at bedtime.          Previous Psychotropic Medications:  Medication Dose   ***  ***                     Substance Abuse History in the last 12 months: Substance Age of 1st Use Last Use Amount Specific Type  Nicotine  ***  ***  ***  ***  Alcohol  ***  ***  ***  ***  Cannabis  ***  ***  ***  ***  Opiates  ***  ***  ***  ***  Cocaine  ***  ***  ***  ***  Methamphetamines  ***  ***  ***  ***  LSD  ***  ***  ***  ***  Ecstasy  ***   ***  ***  ***  Benzodiazepines  ***  ***  ***  ***  Caffeine  ***  ***  ***  ***  Inhalants  ***  ***  ***  ***  Others:                          Medical Consequences of Substance Abuse: ***  Legal Consequences of Substance Abuse: ***  Family Consequences of  Substance Abuse: ***  Blackouts:  {BHH YES OR NO:22294} DT's:  {BHH YES OR NO:22294} Withdrawal Symptoms:  {BHH YES OR NO:22294} {Withdrawal Symptoms:22677}  Social History: Current Place of Residence: *** Place of Birth: *** Family Members: *** Marital Status:  {Marital Status:22678} Children: ***  Sons: ***  Daughters: *** Relationships: *** Education:  {Education:22679} Educational Problems/Performance: *** Religious Beliefs/Practices: *** History of Abuse: {Desc; abuse:16542} Occupational Experiences; Military History:  {Military History:22680} Legal History: *** Hobbies/Interests: ***  Family History:   Family History  Problem Relation Age of Onset  . Cancer Mother   . Thyroid disease Mother   . Diabetes Father     Mental Status Examination/Evaluation: Objective:  Appearance: {Appearance:22683}  Eye Contact::  {BHH EYE CONTACT:22684}  Speech:  {Speech:22685}  Volume:  {Volume (PAA):22686}  Mood:  ***  Affect:  {Affect (PAA):22687}  Thought Process:  {Thought Process (PAA):22688}  Orientation:  {BHH ORIENTATION (PAA):22689}  Thought Content:  {Thought Content:22690}  Suicidal Thoughts:  {ST/HT (PAA):22692}  Homicidal Thoughts:  {ST/HT (PAA):22692}  Judgement:  {Judgement (PAA):22694}  Insight:  {Insight (PAA):22695}  Psychomotor Activity:  {Psychomotor (PAA):22696}  Akathisia:  {BHH YES OR NO:22294}  Handed:  {Handed:22697}  AIMS (if indicated):  ***  Assets:  {Assets (PAA):22698}    Laboratory/X-Ray Psychological Evaluation(s)   ***  ***   Assessment:  {axis diagnosis:3049000}  AXIS I {psych axis 1:31909}  AXIS II {psych axis 2:31910}  AXIS III Past Medical History  Diagnosis Date  . GERD (gastroesophageal reflux disease)      AXIS IV {psych axis iv:31915}  AXIS V {psych axis v score:31919}   Treatment Plan/Recommendations:  Plan of Care: ***  Laboratory:  {Laboratory:22682}  Psychotherapy: ***  Medications: ***  Routine PRN Medications:   {BHH YES OR WF:09323}  Consultations: ***  Safety Concerns:  ***  OtherJorje Guild, PA-C 6/20/20134:02 PM

## 2011-08-08 NOTE — Progress Notes (Addendum)
Psychiatric Assessment Adult  Patient Identification:  Sarah Barry Date of Evaluation:  01/24/2011 Chief Complaint: Depression History of Chief Complaint:   Chief Complaint  Patient presents with  . Depression  . Drug Problem  . Establish Care    HPI Sarah Barry is a 41 year old white female who is well known to this practitioner as she was under my care while she attended the Chemical Dependency Intensive Outpatient Program at [facility]. She successfully completed that program and will be clean from substances of abuse for a period of 90 days in less than 2 weeks. She presents today to continue her treatment for depression.  She has been taking Effexor XR 75 mg each morning and 150 mg in the evening, and Seroquel 150 mg at bedtime. She complains that she has gained 20 pounds since her Seroquel was increased 250 mg, but at 100 mg she would wake early.  She is currently attending 12 step meetings several days per week, and has a sponsor with whom she meets for an hour and a half every Tuesday. She reports that she has had cravings on a couple of occasions since she completed IOP, and the first time she tested out, but the second time she talked about it and the meeting and she called for support.  She feels that her mood has been stable, and she denies much more depression, but she still struggles with sleep. She denies that she has any suicidal or homicidal ideation. She denies any auditory or visual hallucinations. Review of Systems Physical Exam  Depressive Symptoms: loss of energy/fatigue, disturbed sleep,  (Hypo) Manic Symptoms:   Elevated Mood:  No Irritable Mood:  No Grandiosity:  No Distractibility:  No Labiality of Mood:  No Delusions:  No Hallucinations:  No Impulsivity:  No Sexually Inappropriate Behavior:  No Financial Extravagance:  No Flight of Ideas:  No  Anxiety Symptoms: Excessive Worry:  No Panic Symptoms:  No Agoraphobia:  No Obsessive  Compulsive: No  Symptoms: None, Specific Phobias:  No Social Anxiety:  No  Psychotic Symptoms:  Hallucinations: No None Delusions:  No Paranoia:  No   Ideas of Reference:  No  PTSD Symptoms: Ever had a traumatic exposure:  No Had a traumatic exposure in the last month:  No Re-experiencing: No None Hypervigilance:  No Hyperarousal: No None Avoidance: No None  Traumatic Brain Injury: No   Past Psychiatric History: Diagnosis: Bipolar 1   Hospitalizations: [facility]   Outpatient Care: Chemical Dependency IOP   Substance Abuse Care: Chemical Dependency IOP   Self-Mutilation: None   Suicidal Attempts: None   Violent Behaviors: None    Past Medical History:   Past Medical History  Diagnosis Date  . GERD (gastroesophageal reflux disease)    History of Loss of Consciousness:  No Seizure History:  No Cardiac History:  No Allergies:  No Known Allergies Current Medications:  Current Outpatient Prescriptions  Medication Sig Dispense Refill  . amitriptyline (ELAVIL) 10 MG tablet Take one to three tablets at bedtime for sleep  90 tablet  0  . amoxicillin (AMOXIL) 500 MG capsule Take 500 mg by mouth 4 (four) times daily.        Marland Kitchen esomeprazole (NEXIUM) 40 MG capsule Take 1 capsule (40 mg total) by mouth daily before breakfast.  90 capsule  3  . ibuprofen (ADVIL,MOTRIN) 200 MG tablet Take 800 mg by mouth every 6 (six) hours as needed. For pain       . Multiple Vitamins-Minerals (  MULTIVITAMINS THER. W/MINERALS) TABS Take 1 tablet by mouth daily.        . traZODone (DESYREL) 50 MG tablet Take 1 tablet (50 mg total) by mouth at bedtime.  30 tablet  1  . venlafaxine (EFFEXOR) 100 MG tablet Take 2 tablets (200 mg total) by mouth 2 (two) times daily.  120 tablet  1  . DISCONTD: QUEtiapine (SEROQUEL) 100 MG tablet Take 150 mg by mouth at bedtime.          Previous Psychotropic Medications:  Medication Dose                          Substance Abuse History in the last  12 months: Substance Age of 1st Use Last Use Amount Specific Type  Nicotine      Alcohol  14  1/5 liquor  Cannabis          Opiates 40  4 bags heroin  Cocaine  22    Crack  Methamphetamines          LSD          Ecstasy           Benzodiazepines          Caffeine          Inhalants          Others:                          Medical Consequences of Substance Abuse: None known  Legal Consequences of Substance Abuse: Lost driver's license  Family Consequences of Substance Abuse: Lost trust  Blackouts:  Yes DT's:  No Withdrawal Symptoms:  Yes Cramps Diaphoresis Diarrhea Headaches Nausea Tremors Vomiting  Social History: Current Place of Residence: [place], [place] Place of Birth:  Family Members:  Marital Status:  Single Children: None Education:  Corporate treasurer Problems/Performance:  Religious Beliefs/Practices:  History of Abuse:  Teacher, music History:  None. Legal History:  Hobbies/Interests:   Family History:   Family History  Problem Relation Age of Onset  . Cancer Mother   . Thyroid disease Mother   . Diabetes Father     Mental Status Examination/Evaluation: Objective:  Appearance: Casual and Well Groomed  Eye Contact::  Good  Speech:  Clear and Coherent  Volume:  Normal  Mood:  Anxious  Affect:  Congruent  Thought Process:  Logical  Orientation:  Full  Thought Content:  WDL  Suicidal Thoughts:  No  Homicidal Thoughts:  No  Judgement:  Fair  Insight:  Fair and Lacking  Psychomotor Activity:  Normal  Akathisia:  No  Handed:    AIMS (if indicated):    Assets:  Communication Skills Desire for Improvement Physical Health Resilience Social Support Vocational/Educational    Laboratory/X-Ray Psychological Evaluation(s)       Assessment:    AXIS I Mood Disorder NOS  AXIS II Deferred  AXIS III Past Medical History  Diagnosis Date  . GERD (gastroesophageal reflux disease)      AXIS IV economic problems,  problems related to social environment and problems with primary support group  AXIS V 51-60 moderate symptoms   Treatment Plan/Recommendations:  Plan of Care: Start Trazodone and decrease Seroquel to balance sleep and weight gain.  Follow up in 4 - 6 weeks  Laboratory:    Psychotherapy:   Medications: Trazodone, Seroquel and Effexor  Routine PRN Medications:  No  Consultations:   Safety  Concerns:    Other:      Ceairra Mccarver, PA-C 6/20/20134:13 PM

## 2011-08-19 ENCOUNTER — Telehealth (HOSPITAL_COMMUNITY): Payer: Self-pay | Admitting: *Deleted

## 2011-08-19 NOTE — Telephone Encounter (Signed)
Left ZO:XWRUEA sever side effects since Effexor increased. Wants to know if she should go back to original dose, come to see you, or take something else

## 2011-08-20 ENCOUNTER — Telehealth (HOSPITAL_COMMUNITY): Payer: Self-pay | Admitting: Physician Assistant

## 2011-08-20 NOTE — Telephone Encounter (Signed)
This provider called Sarah Barry to address her concerns of side effects since we increased the Effexor. Message was left on voicemail and constructing patient to decrease dose to 100 mg twice daily and call back to report response in 2-3 days.

## 2011-08-27 ENCOUNTER — Ambulatory Visit (HOSPITAL_COMMUNITY): Payer: PRIVATE HEALTH INSURANCE | Admitting: Licensed Clinical Social Worker

## 2011-09-03 ENCOUNTER — Encounter: Payer: Self-pay | Admitting: Gastroenterology

## 2011-09-09 ENCOUNTER — Ambulatory Visit (INDEPENDENT_AMBULATORY_CARE_PROVIDER_SITE_OTHER): Payer: Self-pay | Admitting: Physician Assistant

## 2011-09-09 DIAGNOSIS — F3160 Bipolar disorder, current episode mixed, unspecified: Secondary | ICD-10-CM

## 2011-09-09 DIAGNOSIS — F319 Bipolar disorder, unspecified: Secondary | ICD-10-CM

## 2011-09-09 MED ORDER — CITALOPRAM HYDROBROMIDE 40 MG PO TABS: ORAL_TABLET | ORAL | Status: DC

## 2011-09-09 NOTE — Progress Notes (Signed)
   [facility] Follow-up Outpatient Visit  Sarah Barry August 23, 1970  Date: 09/09/2011   Subjective: Sarah Barry presents today to followup on her treatment of bipolar disorder. She reports that after increasing the Effexor dose at her last appointment she began to experience increased sweating, so as advised by telephone, she decreased the dose by half. She reports that the sweating decreased, but she has stopped taking the Effexor 4 days ago. She is now experiencing dizziness, loose bowels, inability to concentrate, brain shocks, agitation, and very hard sleep. She continues to complain of poor energy, and reports that her depression has increased. She is crying frequently. She is also experiencing more anxiety. She is worrying more about financial difficulties, and other stressors such as her car broke down, not getting enough hours at work, and her boyfriend. She is also dealing with some medical problems. She had a relapse in June do to a breakup with her boyfriend. She picked up a restart chip and has begun attending 12 step meetings once again, and is committed to doing 90 meetings in 90 days. She also reports that she falls asleep okay, but she wakes up every hour. She denies any suicidal or homicidal ideation. She denies any auditory or visual hallucinations.  There were no vitals filed for this visit.  Mental Status Examination  Appearance: Well groomed and casually dressed Alert: Yes Attention: good  Cooperative: Yes Eye Contact: Good Speech: Clear and coherent Psychomotor Activity: Normal Memory/Concentration: Intact Oriented: person, place, time/date and situation Mood: Anxious and Dysphoric Affect: Constricted and Tearful Thought Processes and Associations: Logical Fund of Knowledge: Fair Thought Content: Normal Insight: Fair Judgement: Fair  Diagnosis: Bipolar mixed  Treatment Plan: We will put her on Celexa 40 mg at bedtime to help alleviate the withdrawal from  Effexor and her increased anxiety, with hopes that it may help her to sleep. She will return for followup in 6 weeks.  Nikia Levels, PA-C

## 2011-09-11 ENCOUNTER — Encounter: Payer: Self-pay | Admitting: Gastroenterology

## 2011-09-11 ENCOUNTER — Telehealth: Payer: Self-pay | Admitting: Gastroenterology

## 2011-09-11 ENCOUNTER — Ambulatory Visit (INDEPENDENT_AMBULATORY_CARE_PROVIDER_SITE_OTHER): Payer: Self-pay | Admitting: Gastroenterology

## 2011-09-11 VITALS — BP 104/62 | HR 86 | Ht 60.0 in | Wt 159.1 lb

## 2011-09-11 DIAGNOSIS — R142 Eructation: Secondary | ICD-10-CM

## 2011-09-11 DIAGNOSIS — K5900 Constipation, unspecified: Secondary | ICD-10-CM

## 2011-09-11 DIAGNOSIS — K219 Gastro-esophageal reflux disease without esophagitis: Secondary | ICD-10-CM

## 2011-09-11 DIAGNOSIS — R143 Flatulence: Secondary | ICD-10-CM

## 2011-09-11 MED ORDER — POLYETHYLENE GLYCOL 3350 17 GM/SCOOP PO POWD: ORAL | Status: DC

## 2011-09-11 MED ORDER — ESOMEPRAZOLE MAGNESIUM 40 MG PO CPDR: 40.0000 mg | DELAYED_RELEASE_CAPSULE | Freq: Two times a day (BID) | ORAL | Status: DC

## 2011-09-11 NOTE — Progress Notes (Signed)
History of Present Illness: This is a 41 year old female here today for chronic abdominal bloating and chronic constipation. She was evaluated at [facility] gastroenterology in November 2011 and underwent upper endoscopy with dilation. Paperwork with her today shows that she had esophageal stricture and esophagitis although do not have the endoscopy report. She's been treated with Nexium since that time and notes no heartburn or dysphagia. She does have frequent postprandial belching, generalized intestinal gas and bloating. She has ongoing constipation but takes MiraLax only as needed. She saw Dr. Clelia Croft in May and I have the office note from that visit but no results of blood work performed at that visit. Denies weight loss, diarrhea, change in stool caliber, melena, hematochezia, nausea, vomiting, dysphagia, reflux symptoms, chest pain.  Current Medications, Allergies, Past Medical History, Past Surgical History, Family History and Social History were reviewed in [employer] record.  Physical Exam: General: Well developed , well nourished, no acute distress Head: Normocephalic and atraumatic Eyes:  sclerae anicteric, EOMI Ears: Normal auditory acuity Mouth: No deformity or lesions Lungs: Clear throughout to auscultation Heart: Regular rate and rhythm; no murmurs, rubs or bruits Abdomen: Soft, non tender and non distended. No masses, hepatosplenomegaly or hernias noted. Normal Bowel sounds Musculoskeletal: Symmetrical with no gross deformities  Pulses:  Normal pulses noted Extremities: No clubbing, cyanosis, edema or deformities noted Neurological: Alert oriented x 4, grossly nonfocal Psychological:  Alert and cooperative. Normal mood and affect  Assessment and Recommendations:  1. GERD with a history of esophagitis and peptic stricture. Her postprandial bloating, gas and belching could be related to reflux. Intensify antireflux measures and increase Nexium to 40 mg twice  daily.  2. Chronic constipation. Await results of blood work performed at Dr. Alver Fisher office. We have left a message with the office to send blood work. Use MiraLax daily to twice a day titrated for adequate results. Begin a low gas diet. If her symptoms did not adequately respond consider colonoscopy for further evaluation.

## 2011-09-11 NOTE — Telephone Encounter (Signed)
Forward 15 pages to Dr. Claudette Head for review on 09-11-11 ym

## 2011-09-11 NOTE — Patient Instructions (Addendum)
You have been given a separate informational sheet regarding your tobacco use, the importance of quitting and local resources to help you quit.  Increase taking your Nexium to one tablet by mouth twice daily. A new prescription has been sent to your pharmacy.  Increase your over the counter Miralax 1- 1 1/2 scoops mixing in 8 oz of water once or twice daily to titrate depending on bowel movements.  We have given you a Low-gas diet.  Patient advised to avoid spicy, acidic, citrus, chocolate, mints, fruit and fruit juices.  Limit the intake of caffeine, alcohol and Soda.  Don't exercise too soon after eating.  Don't lie down within 3-4 hours of eating.  Elevate the head of your bed. Follow up with your Primary Care Physician or [place] as needed. cc: Norberto Sorenson, MD

## 2011-09-16 ENCOUNTER — Emergency Department (INDEPENDENT_AMBULATORY_CARE_PROVIDER_SITE_OTHER): Payer: Self-pay

## 2011-09-16 ENCOUNTER — Encounter (HOSPITAL_COMMUNITY): Payer: Self-pay | Admitting: *Deleted

## 2011-09-16 ENCOUNTER — Emergency Department (HOSPITAL_COMMUNITY)
Admission: EM | Admit: 2011-09-16 | Discharge: 2011-09-16 | Disposition: A | Discharge status: Home / Self Care | Payer: Self-pay | Source: Home / Self Care

## 2011-09-16 DIAGNOSIS — S93409A Sprain of unspecified ligament of unspecified ankle, initial encounter: Secondary | ICD-10-CM

## 2011-09-16 DIAGNOSIS — M25579 Pain in unspecified ankle and joints of unspecified foot: Secondary | ICD-10-CM

## 2011-09-16 DIAGNOSIS — M25571 Pain in right ankle and joints of right foot: Secondary | ICD-10-CM

## 2011-09-16 MED ORDER — IBUPROFEN 800 MG PO TABS
800.0000 mg | ORAL_TABLET | Freq: Once | ORAL | Status: AC
  Administered 2011-09-16: 800 mg via ORAL

## 2011-09-16 MED ORDER — TRAMADOL HCL 50 MG PO TABS: 50.0000 mg | ORAL_TABLET | Freq: Four times a day (QID) | ORAL | Status: DC | PRN

## 2011-09-16 MED ORDER — IBUPROFEN 800 MG PO TABS: 800.0000 mg | ORAL_TABLET | Freq: Three times a day (TID) | ORAL | Status: DC

## 2011-09-16 MED ORDER — IBUPROFEN 800 MG PO TABS
ORAL_TABLET | ORAL | Status: AC
  Filled 2011-09-16: qty 1

## 2011-09-16 NOTE — ED Provider Notes (Signed)
History     CSN: [id number]  Arrival date & time 09/16/11  1550   None     Chief Complaint  Patient presents with  . Ankle Pain    (Consider location/radiation/quality/duration/timing/severity/associated sxs/prior treatment) Patient is a 41 y.o. female presenting with ankle pain. The history is provided by the patient.  Ankle Pain   Patient reports right ankle pain after slipping on curb at approximately 3 pm today.  Reports hearing popping sound at time of incident.  Pain is primarily located on lateral aspect of ankle associated with swelling and radiation up right lateral leg.  Painful weight bearing immediately after incident.  Able to ambulate but with limp, pain increases with prolonged standing/use.  Denies numbness/tingling.   No prior ankle/foot injury or surgery.  Has known history of osteoarthritis.  Applied ice and ace wrap prior to arrival.   Past Medical History  Diagnosis Date  . GERD (gastroesophageal reflux disease)   . Allergic rhinitis   . Folliculitis   . Chronic headache   . Thrombocytosis   . Bipolar disorder   . Positive H. pylori test   . Depression     Past Surgical History  Procedure Date  . Cholecystectomy     Family History  Problem Relation Age of Onset  . Colon cancer Maternal Grandfather   . Diabetes Paternal Grandmother     History  Substance Use Topics  . Smoking status: Current Everyday Smoker -- 1.0 packs/day    Types: Cigarettes  . Smokeless tobacco: Never Used  . Alcohol Use: No    OB History    Grav Para Term Preterm Abortions TAB SAB Ect Mult Living                  Review of Systems  Cardiovascular: Negative for leg swelling.  Musculoskeletal: Positive for joint swelling and gait problem.  Skin: Negative.   Neurological: Negative.     Allergies  Review of patient's allergies indicates no known allergies.  Home Medications   Current Outpatient Rx  Name Route Sig Dispense Refill  . CITALOPRAM HYDROBROMIDE 40  MG PO TABS  Take 1/2 tablet at bedtime for 6 nights, then take one tablet at bedtime. 90 tablet 0  . ESOMEPRAZOLE MAGNESIUM 40 MG PO CPDR Oral Take 1 capsule (40 mg total) by mouth 2 (two) times daily. 60 capsule 11  . IBUPROFEN 200 MG PO TABS Oral Take 800 mg by mouth every 6 (six) hours as needed. For pain     . IRON PO TABS  Energizing Iron take 1 tablet 6-8 times per day    . THERA M PLUS PO TABS Oral Take 1 tablet by mouth daily.      Marland Kitchen POLYETHYLENE GLYCOL 3350 PO POWD  Take 1- 1 1/2 scoops mixing in 8 oz of water twice daily 255 g     BP 117/75  Pulse 92  Temp 98.1 F (36.7 C) (Oral)  Resp 18  SpO2 97%  LMP 08/25/2011  Physical Exam  Nursing note and vitals reviewed. Constitutional: She is oriented to person, place, and time. Vital signs are normal. She appears well-developed and well-nourished. She is active and cooperative.  HENT:  Head: Normocephalic.  Eyes: Conjunctivae are normal. Pupils are equal, round, and reactive to light. No scleral icterus.  Neck: Trachea normal. Neck supple.  Cardiovascular: Normal rate, regular rhythm, normal heart sounds, intact distal pulses and normal pulses.   Pulmonary/Chest: Effort normal and breath sounds normal.  Musculoskeletal:  Right knee: Normal.       Right ankle: She exhibits swelling. She exhibits no ecchymosis, no deformity and normal pulse. tenderness. Lateral malleolus tenderness found. No medial malleolus, no AITFL, no posterior TFL, no head of 5th metatarsal and no proximal fibula tenderness found. Achilles tendon normal. Achilles tendon exhibits no pain and normal Thompson's test results.       Left ankle: Normal. Achilles tendon normal.       Right foot: She exhibits tenderness.       Left foot: Normal.  Neurological: She is alert and oriented to person, place, and time. She has normal strength. No cranial nerve deficit or sensory deficit. GCS eye subscore is 4. GCS verbal subscore is 5. GCS motor subscore is 6.  Skin:  Skin is warm and dry.  Psychiatric: She has a normal mood and affect. Her speech is normal and behavior is normal. Judgment and thought content normal. Cognition and memory are normal.    ED Course  Procedures (including critical care time)  Labs Reviewed - No data to display Dg Ankle Complete Right  09/16/2011  *RADIOLOGY REPORT*  Clinical Data: Ankle pain post twisted ankle  RIGHT ANKLE - COMPLETE 3+ VIEW  Comparison: None.  Findings: Three views of the right ankle submitted.  No acute fracture or subluxation.  There is soft tissue swelling adjacent to lateral malleolus.  Ankle mortise is preserved.  IMPRESSION:  No acute fracture or subluxation.  Soft tissue swelling laterally.  Original Report Authenticated By: Natasha Mead, M.D.     1. Right ankle pain       MDM  No ankle fracture seen on xray.  Limit weight bearing on your foot for two weeks.  Apply ice pack for 30 minutes every 1 to 2 hours today and tomorrow.  Elevate.  Wear brace for about 2 to 3 weeks.  Begin antiinflammatory as instructed (ibuprofen).  Follow up with orthopedist in 2 weeks if symptoms are not improved.        Johnsie Kindred, NP 09/16/11 1808

## 2011-09-16 NOTE — ED Notes (Signed)
Pt  Fell  Today  sev  Hrs  Ago  And  inj  Her  r  Ankle    -   She reports  She  Twisted  The  Ankle  And  Felt  Pop        Pain on  Weight  Bearing

## 2011-09-16 NOTE — Discharge Instructions (Signed)
Ankle Sprain An ankle sprain is an injury to the strong, fibrous tissues (ligaments) that hold the bones of your ankle joint together.  CAUSES Ankle sprain usually is caused by a fall or by twisting your ankle. People who participate in sports are more prone to these types of injuries.  SYMPTOMS  Symptoms of ankle sprain include:  Pain in your ankle. The pain may be present at rest or only when you are trying to stand or walk.   Swelling.   Bruising. Bruising may develop immediately or within 1 to 2 days after your injury.   Difficulty standing or walking.  DIAGNOSIS  Your caregiver will ask you details about your injury and perform a physical exam of your ankle to determine if you have an ankle sprain. During the physical exam, your caregiver will press and squeeze specific areas of your foot and ankle. Your caregiver will try to move your ankle in certain ways. An X-ray exam may be done to be sure a bone was not broken or a ligament did not separate from one of the bones in your ankle (avulsion).  TREATMENT  Certain types of braces can help stabilize your ankle. Your caregiver can make a recommendation for this. Your caregiver may recommend the use of medication for pain. If your sprain is severe, your caregiver may refer you to a surgeon who helps to restore function to parts of your skeletal system (orthopedist) or a physical therapist. HOME CARE INSTRUCTIONS  Apply ice to your injury for 1 to 2 days or as directed by your caregiver. Applying ice helps to reduce inflammation and pain.  Put ice in a plastic bag.   Place a towel between your skin and the bag.   Leave the ice on for 15 to 20 minutes at a time, every 2 hours while you are awake.   Take over-the-counter or prescription medicines for pain, discomfort, or fever only as directed by your caregiver.   Keep your injured leg elevated, when possible, to lessen swelling.   If your caregiver recommends crutches, use them as  instructed. Gradually, put weight on the affected ankle. Continue to use crutches or a cane until you can walk without feeling pain in your ankle.   If you have a plaster splint, wear the splint as directed by your caregiver. Do not rest it on anything harder than a pillow the first 24 hours. Do not put weight on it. Do not get it wet. You may take it off to take a shower or bath.   You may have been given an elastic bandage to wear around your ankle to provide support. If the elastic bandage is too tight (you have numbness or tingling in your foot or your foot becomes cold and blue), adjust the bandage to make it comfortable.   If you have an air splint, you may blow more air into it or let air out to make it more comfortable. You may take your splint off at night and before taking a shower or bath.   Wiggle your toes in the splint several times per day if you are able.  SEEK MEDICAL CARE IF:   You have an increase in bruising, swelling, or pain.   Your toes feel cold.   Pain relief is not achieved with medication.  SEEK IMMEDIATE MEDICAL CARE IF: Your toes are numb or blue or you have severe pain. MAKE SURE YOU:   Understand these instructions.   Will watch your condition.     Will get help right away if you are not doing well or get worse.  Document Released: 02/04/2005 Document Revised: 01/24/2011 Document Reviewed: 09/09/2007 [employer] Patient Information 2012 [employer]. 

## 2011-09-17 NOTE — ED Provider Notes (Signed)
Medical screening examination/treatment/procedure(s) were performed by non-physician practitioner and as supervising physician I was immediately available for consultation/collaboration.  Kymere Fullington   Devika Dragovich, MD 09/17/11 1858 

## 2011-09-18 ENCOUNTER — Ambulatory Visit (HOSPITAL_COMMUNITY): Payer: Self-pay | Admitting: Psychiatry

## 2011-09-23 ENCOUNTER — Other Ambulatory Visit: Payer: Self-pay

## 2011-09-23 MED ORDER — POLYETHYLENE GLYCOL 3350 17 GM/SCOOP PO POWD: ORAL | Status: DC

## 2011-09-25 ENCOUNTER — Telehealth: Payer: Self-pay | Admitting: Gastroenterology

## 2011-09-25 ENCOUNTER — Ambulatory Visit (INDEPENDENT_AMBULATORY_CARE_PROVIDER_SITE_OTHER): Payer: Self-pay | Admitting: Psychiatry

## 2011-09-25 ENCOUNTER — Encounter (HOSPITAL_COMMUNITY): Payer: Self-pay | Admitting: Psychiatry

## 2011-09-25 DIAGNOSIS — F319 Bipolar disorder, unspecified: Secondary | ICD-10-CM

## 2011-09-25 NOTE — Telephone Encounter (Signed)
Patient reports no improvement in her symptoms.  She has continued alternating bowel habits.  Was unable to get out of bed yesterday.  She will come in and see Dr. Russella Dar tomorrow at 9:15
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
## 2011-09-26 ENCOUNTER — Ambulatory Visit (INDEPENDENT_AMBULATORY_CARE_PROVIDER_SITE_OTHER): Payer: Self-pay | Admitting: Gastroenterology

## 2011-09-26 ENCOUNTER — Encounter: Payer: Self-pay | Admitting: Gastroenterology

## 2011-09-26 VITALS — BP 118/60 | HR 68 | Ht 60.0 in | Wt 161.0 lb

## 2011-09-26 DIAGNOSIS — D509 Iron deficiency anemia, unspecified: Secondary | ICD-10-CM

## 2011-09-26 DIAGNOSIS — R143 Flatulence: Secondary | ICD-10-CM

## 2011-09-26 DIAGNOSIS — R142 Eructation: Secondary | ICD-10-CM

## 2011-09-26 DIAGNOSIS — R141 Gas pain: Secondary | ICD-10-CM

## 2011-09-26 DIAGNOSIS — K5900 Constipation, unspecified: Secondary | ICD-10-CM

## 2011-09-26 MED ORDER — LINACLOTIDE 290 MCG PO CAPS: 1.0000 | ORAL_CAPSULE | Freq: Every day | ORAL | Status: DC

## 2011-09-26 NOTE — Progress Notes (Signed)
History of Present Illness: This is a 41 year old female who has persistent problems with generalized abdominal pain, bloating and constipation. She states that taking MiraLax made her stools looser and helped with bloating but did not relieve the bloating. She is concerned about ongoing fatigue. She states she was diagnosed with iron deficiency anemia recently. She complains about weight gain primarily in her abdomen that also makes her feel bloated. Unfortunately had not received blood work from Dr. Alver Fisher office as requested.   Current Medications, Allergies, Past Medical History, Past Surgical History, Family History and Social History were reviewed in [employer] record.  Physical Exam: General: Well developed , well nourished, no acute distress Head: Normocephalic and atraumatic Eyes:  sclerae anicteric, EOMI Ears: Normal auditory acuity Mouth: No deformity or lesions Lungs: Clear throughout to auscultation Heart: Regular rate and rhythm; no murmurs, rubs or bruits Abdomen: Soft, diffuse and mild tenderness to deep palpation without rebound or guarding and non distended. No masses, hepatosplenomegaly or hernias noted. Normal Bowel sounds Rectal: Deferred to colonoscopy Musculoskeletal: Symmetrical with no gross deformities  Pulses:  Normal pulses noted Extremities: No clubbing, cyanosis, edema or deformities noted Neurological: Alert oriented x 4, grossly nonfocal Psychological:  Alert and cooperative. Normal mood and affect  Assessment and Recommendations:  1. Constipation, abdominal pain, abdominal bloating. A portion of her bloating may be due to weight gain. 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 Trial of Linzess daily, samples provided. Advised that she may have loose stools but she should continue taking unless she has excessive diarrhea. Further evaluation with colonoscopy. The risks, benefits, and alternatives to colonoscopy  with possible biopsy and possible polypectomy were discussed with the patient and they consent to proceed however she would like to wait to schedule full she has insurance coverage and one month or so. She will contact us when she is ready to schedule   2. Iron deficiency anemia. Patient states she will obtain her blood work and provide a copy to [place]. Continue iron replacement. Further evaluation with colonoscopy when she is ready to schedule.

## 2011-09-26 NOTE — Patient Instructions (Addendum)
Please call us back to schedule your Colonoscopy when you get insurance. Start taking the Linzess samples one tablet by mouth once daily for constipation. Please bring your labs from Dr. Alver Fisher office to [facility] for Dr. Russella Dar to review. cc: Norberto Sorenson, MD

## 2011-10-11 ENCOUNTER — Telehealth: Payer: Self-pay | Admitting: Gastroenterology

## 2011-10-11 NOTE — Telephone Encounter (Signed)
Spoke with patient and she still does not have insurance and she would like more samples of Linzess. She states it really has helped. Told her I will leave samples out front for patient to pick up. Pt agreed.

## 2011-10-11 NOTE — Telephone Encounter (Signed)
Error

## 2011-10-11 NOTE — Telephone Encounter (Signed)
Left a message for patient to return my call. 

## 2011-10-23 ENCOUNTER — Ambulatory Visit (HOSPITAL_COMMUNITY): Payer: Self-pay | Admitting: Physician Assistant

## 2011-10-24 ENCOUNTER — Ambulatory Visit (HOSPITAL_COMMUNITY): Payer: Self-pay | Admitting: Psychiatry

## 2011-10-30 ENCOUNTER — Telehealth: Payer: Self-pay | Admitting: Gastroenterology

## 2011-10-30 DIAGNOSIS — D649 Anemia, unspecified: Secondary | ICD-10-CM

## 2011-10-30 NOTE — Telephone Encounter (Signed)
There are no labs on iron studies that I can see are in her chart.  Per Dr. Ardell Isaacs note she was going to obtain these from Dr. Clelia Croft.

## 2011-10-30 NOTE — Telephone Encounter (Signed)
Left message for patient to call back  

## 2011-10-31 ENCOUNTER — Ambulatory Visit (INDEPENDENT_AMBULATORY_CARE_PROVIDER_SITE_OTHER): Payer: Self-pay | Admitting: Psychiatry

## 2011-10-31 ENCOUNTER — Encounter (HOSPITAL_COMMUNITY): Payer: Self-pay | Admitting: Psychiatry

## 2011-10-31 DIAGNOSIS — F3132 Bipolar disorder, current episode depressed, moderate: Secondary | ICD-10-CM

## 2011-10-31 NOTE — Telephone Encounter (Signed)
I have left a message for the patient to call back and discuss 

## 2011-10-31 NOTE — Telephone Encounter (Signed)
Send fe, tibc, ferritin, B12 and folate-----Sent: 10/30/2011 3:29 PM From Meryl Dare, MD,FACG

## 2011-10-31 NOTE — Addendum Note (Signed)
Addended by: Annett Fabian on: 10/31/2011 10:49 AM   Modules accepted: Orders

## 2011-10-31 NOTE — Progress Notes (Signed)
   THERAPIST PROGRESS NOTE  Session Time: 2:00-2:50 pm  Participation Level: Active  Behavioral Response: CasualAlertDepressed  Type of Therapy: Individual Therapy  Treatment Goals addressed: Communication: Created Treatment Plan Goals  Interventions: Other: Created Treatment Plan Goals  Summary: Sarah Barry is a 41 y.o. female who presents with depressed mood and affect. This is patients second counseling session. She states it took her a while to get appointments scheduled and that is the reason for not being seen in over a month. She came prepared to discuss treatment plan goals, which was discussed in the previous session. She reports having 7/9 depression symptoms. She states she is not having thoughts of suicide but fears it could escalate to that level if she is not able to reduce her symptoms. The session was used to create treatment plan goals. See below. PROBLEM   GOALS INTERVENTIONS/SERVICES TARGET DATE DATE MET  # 1) Depression Overall Goal: Be motivated to experience life again (feel happy again) o Be more social - want to be around people again o Have higher energy and motivation levels o Be consistent with the things I start o Be stable on mental health medications o Weekly Counseling o Help identify triggers and causes for depression. o Develop healthy routine to manage depression symptoms o Create a crisis plan to enact if symptoms worsen o Help patient with advocacy and communication skills to talk with her PA, Jorje Guild to ensure optimal medication management to treat depression symptoms.  04/29/12   REVIEW DATE  JUSTIFICATION FOR CONDINTUATION/DISCONTINUATION OF GOAL:     PROBLEM   GOALS INTERVENTIONS/SERVICES TARGET DATE DATE MET  #2) Physical Health o Stop smoking o [place] medical conditions o Maintain sobriety o Provide support, education and resources to help with stopping smoking o Help patient explore medical options and find new doctors to help manage  medical needs after the closing of [facility]. o Encourage attending AA and working sobriety plan. 04/29/12   REVIEW DATE  JUSTIFICATION FOR CONDINTUATION/DISCONTINUATION OF GOAL:      PROBLEM   GOALS INTERVENTIONS/SERVICES TARGET DATE DATE MET  #3) Healthy Relationships o Have healthy relationships o Explore how past influences current relationships o Explore how behavior impacts relationships o Explore patterns in past and current relationships and how they affect wellness.  o Learn components to healthy romantic relationships. 04/29/12   REVIEW DATE  JUSTIFICATION FOR CONDINTUATION/DISCONTINUATION OF GOAL:       Suicidal/Homicidal: Nowithout intent/plan  Therapist Response: Assess overall level of depression and reviewed symptoms. Created Treatment plan goals   Plan: Return again in 1 weeks. Begin work on goals  Diagnosis: Axis I: Bipolar, Depressed    Axis II: None    Sarah Rainford E, LCSW 10/31/2011

## 2011-11-04 NOTE — Telephone Encounter (Signed)
I have left another message for the patient to discuss

## 2011-11-06 NOTE — Telephone Encounter (Signed)
I have had no return call from the patient.  I will await a return call from her

## 2011-11-07 ENCOUNTER — Encounter (HOSPITAL_COMMUNITY): Payer: Self-pay | Admitting: Psychiatry

## 2011-11-07 ENCOUNTER — Ambulatory Visit (INDEPENDENT_AMBULATORY_CARE_PROVIDER_SITE_OTHER): Payer: Self-pay | Admitting: Psychiatry

## 2011-11-07 DIAGNOSIS — F3177 Bipolar disorder, in partial remission, most recent episode mixed: Secondary | ICD-10-CM

## 2011-11-07 NOTE — Progress Notes (Signed)
   THERAPIST PROGRESS NOTE  Session Time: 3:00-3:50 pm  Participation Level: Active  Behavioral Response: CasualAlertAnxious  Type of Therapy: Individual Therapy  Treatment Goals addressed: Anxiety and Coping  Interventions: Solution Focused, Strength-based and Supportive  Summary: Sarah Barry is a 41 y.o. female who presents with anxious mood and affect. She reports high anxiety associated with several life changes going on. She states she is happy that she was offered a job this week that is considered full time and includes health benefits. She states she really needs to get in with a primary care doctor to explore causes for her stomach bloating and upset. She states she also has a lot of fatigue and needs to nap daily for several hours to re-energize after work and has concerns about her low energy level and the cause. She described feeling sad over the death of her dog two weeks ago and how she had to make the decision to put her to sleep. She described how she is managing her stress during this stressful time and states she has not had the urge to drink and is still attending meetings and using her stress management skills.   Suicidal/Homicidal: Nowithout intent/plan  Therapist Response: Helped patient get connected to a primary care doctor and helped her get connected with resources to access her medical records from [facility]. Provided supportive counseling and problem solving skills.   Plan: Return again in 1 weeks. Explore medical syptoms and assess fatigue. Follow up with medical records and finding a doctor.   Diagnosis: Axis I: Bipolar 1 in partial remission    Axis II: None    Jameia Makris E, LCSW 11/07/2011

## 2011-11-14 ENCOUNTER — Encounter (HOSPITAL_COMMUNITY): Payer: Self-pay | Admitting: Physician Assistant

## 2011-11-14 ENCOUNTER — Ambulatory Visit (INDEPENDENT_AMBULATORY_CARE_PROVIDER_SITE_OTHER): Payer: Self-pay | Admitting: Physician Assistant

## 2011-11-14 ENCOUNTER — Ambulatory Visit (HOSPITAL_COMMUNITY): Payer: Self-pay | Admitting: Psychiatry

## 2011-11-14 DIAGNOSIS — F3130 Bipolar disorder, current episode depressed, mild or moderate severity, unspecified: Secondary | ICD-10-CM

## 2011-11-14 DIAGNOSIS — F319 Bipolar disorder, unspecified: Secondary | ICD-10-CM

## 2011-11-14 MED ORDER — BUPROPION HCL ER (XL) 150 MG PO TB24: 150.0000 mg | ORAL_TABLET | Freq: Every day | ORAL | Status: DC

## 2011-11-14 NOTE — Progress Notes (Signed)
   [facility] Follow-up Outpatient Visit  ELIZETTE PAFFORD 1971/02/03  Date: 11/14/2011   Subjective: Michelene presents today to followup on her treatment for bipolar disorder. She complains that she is tired and wants to sleep all the time. She endorses some recent depression, but recently her mood has improved since she learned that she will now be able to get her needed increased hours at work. She continues to go to 12-step meetings, but experienced a relapse approximately one month ago for one day. She denies any suicidal or homicidal ideation. She denies any auditory or visual hallucinations.  Bertia is experiencing some medical problems including gastrointestinal bloating. She is seeing specialists for these disorders.  There were no vitals filed for this visit.  Mental Status Examination  Appearance: Well groomed and casually dressed Alert: Yes Attention: good  Cooperative: Yes Eye Contact: Good Speech: Clear and coherent Psychomotor Activity: Normal Memory/Concentration: Intact Oriented: person, place, time/date and situation Mood: Euthymic Affect: Congruent Thought Processes and Associations: Linear Fund of Knowledge: Good Thought Content: Normal Insight: Good Judgement: Good  Diagnosis: Bipolar disorder, most recent episode depressed  Treatment Plan: We will start Tresa Endo on Wellbutrin XL 150 mg, and continue her Celexa 40 mg at bedtime. She will return for followup in 2 months.  Camp Gopal, PA-C

## 2011-11-21 ENCOUNTER — Encounter (HOSPITAL_COMMUNITY): Payer: Self-pay | Admitting: Psychiatry

## 2011-11-21 ENCOUNTER — Ambulatory Visit (INDEPENDENT_AMBULATORY_CARE_PROVIDER_SITE_OTHER): Payer: Self-pay | Admitting: Psychiatry

## 2011-11-21 DIAGNOSIS — F319 Bipolar disorder, unspecified: Secondary | ICD-10-CM

## 2011-11-21 NOTE — Progress Notes (Signed)
   THERAPIST PROGRESS NOTE  Session Time: 3:00-3:50 pm  Participation Level: Active  Behavioral Response: CasualAlertAnxious  Type of Therapy: Individual Therapy  Treatment Goals addressed: Anxiety  Interventions: Biofeedback  Summary: Sarah Barry is a 41 y.o. female who presents with anxious mood and affect. She reports an increase in happiness and reports no depression and even felt "excitement" for the first time in a long time yesterday, but states her anxiety is a 10/10 and started about the same time she was started on a new medication one week ago (Wellbutrin). She states she is unsure if the anxiety and racing thoughts are related to starting the medication, but will track the symptoms over the next few days and contact her PA if conditions continue. She stated things with work are going well and she wanted to use the session to try to reduce anxiety symptoms. Patient was introduced to the heartmath "nuetral" technique and logged onto the computer program. She reduced anxiety symptoms to a level 5/10 within fifteen minutes of using the technique and reported feeling increased calm. She agreed to practice the technique until the next session to try to manage the symptoms.    Suicidal/Homicidal: Nowithout intent/plan  Therapist Response: Assessed anxiety and depression symptoms. Introduced Marketing executive to reduce anxiety symptoms - Heartmath "Nuetral" Technique.  Plan: Return again in 1 weeks. Explore how patient wants to use new skill of heartmath going forward in future sessions and assess current level of depression and anxiety symptoms.    Diagnosis: Axis I: Bipolar I Disorder    Axis II: None    Alieyah Spader E, LCSW 11/21/2011

## 2011-11-27 ENCOUNTER — Telehealth: Payer: Self-pay | Admitting: Gastroenterology

## 2011-11-27 NOTE — Telephone Encounter (Signed)
Samples left out front for patient to pick up. Pt notified. 

## 2011-11-29 ENCOUNTER — Encounter (HOSPITAL_COMMUNITY): Payer: Self-pay | Admitting: Psychiatry

## 2011-11-29 ENCOUNTER — Ambulatory Visit (INDEPENDENT_AMBULATORY_CARE_PROVIDER_SITE_OTHER): Payer: Self-pay | Admitting: Psychiatry

## 2011-11-29 DIAGNOSIS — F3177 Bipolar disorder, in partial remission, most recent episode mixed: Secondary | ICD-10-CM
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
## 2011-12-05 ENCOUNTER — Encounter (HOSPITAL_COMMUNITY): Payer: Self-pay | Admitting: Psychiatry

## 2011-12-05 ENCOUNTER — Ambulatory Visit (INDEPENDENT_AMBULATORY_CARE_PROVIDER_SITE_OTHER): Payer: Self-pay | Admitting: Psychiatry

## 2011-12-05 DIAGNOSIS — F3177 Bipolar disorder, in partial remission, most recent episode mixed: Secondary | ICD-10-CM

## 2011-12-05 NOTE — Progress Notes (Signed)
   THERAPIST PROGRESS NOTE  Session Time: 3:00-3:50 pm  Participation Level: Active  Behavioral Response: Casual and In work scrubsAlertAnxious and Depressed  Type of Therapy: Individual Therapy  Treatment Goals addressed: Coping  Interventions: CBT and Solution Focused  Summary: Sarah Barry is a 41 y.o. female who presents with moderate depressed and anxious mood. She reports both anxiety and depression at a 6/10 and states that her main stressors are job related. She described frustration with her employer [employer]) for not providing her with full time hours that they promised her and how this could prevent her from getting medical benefits this December 1st. She described how she enjoys her work but is annoyed with the management and how much energy it takes her to ensure she has enough hours each week. She states she still has very low energy and has been trying to combat that on her own through diet and quitting smoking, which she states she has been smoke free for six days. She described trying to eat healthier and buying new tennis shoes to begin exercising. She said she still plans on attending college in January, but is confused as to how to enroll and is overwhelmed with the financial aid process. She said she feels overwhelmed in general with having a lot of things she needs to do and feeling uncertain as to where to start. She agreed to bring her items to the next session to work on organization, which she states would reduce her depression. She states she continues to go to [employer] and is still searching for the right sponsor.   Suicidal/Homicidal: Nowithout intent/plan  Therapist Response: Explored depression and anxiety symptoms and stressors contributing to these symptoms. Used solution focused therapy to reduce stressors and improve mood elevation. Created an action plan to bring items to next session that would continue to reduce stressors and elevate  mood. Discussed recovery process and search for a sponsor.   Plan: Return again in 1 weeks. Review the items patient needs to accomplish that she agreed to bring to the next session.   Diagnosis: Axis I: Bipolar I and Substance Abuse    Axis II: None    Graceann Boileau E, LCSW 12/05/2011

## 2011-12-12 ENCOUNTER — Ambulatory Visit (HOSPITAL_COMMUNITY): Payer: Self-pay | Admitting: Psychiatry

## 2011-12-18 ENCOUNTER — Telehealth: Payer: Self-pay | Admitting: Gastroenterology

## 2011-12-18 ENCOUNTER — Other Ambulatory Visit (HOSPITAL_COMMUNITY): Payer: Self-pay | Admitting: Physician Assistant

## 2011-12-18 NOTE — Telephone Encounter (Signed)
Told patient that I can give her a few more samples of Linzess but she does not to schedule a Colonoscopy soon since she said she would do so when she got insurance.

## 2011-12-19 ENCOUNTER — Ambulatory Visit (HOSPITAL_COMMUNITY): Payer: Self-pay | Admitting: Psychiatry

## 2011-12-20 ENCOUNTER — Other Ambulatory Visit (HOSPITAL_COMMUNITY): Payer: Self-pay | Admitting: Physician Assistant

## 2011-12-20 ENCOUNTER — Encounter: Payer: Self-pay | Admitting: Gastroenterology

## 2011-12-20 MED ORDER — CITALOPRAM HYDROBROMIDE 40 MG PO TABS: 40.0000 mg | ORAL_TABLET | Freq: Every day | ORAL | Status: DC

## 2011-12-24 ENCOUNTER — Ambulatory Visit (HOSPITAL_COMMUNITY): Payer: Self-pay | Admitting: Physician Assistant

## 2011-12-24 ENCOUNTER — Ambulatory Visit (INDEPENDENT_AMBULATORY_CARE_PROVIDER_SITE_OTHER): Payer: Self-pay | Admitting: Physician Assistant

## 2011-12-24 DIAGNOSIS — F3130 Bipolar disorder, current episode depressed, mild or moderate severity, unspecified: Secondary | ICD-10-CM

## 2011-12-24 DIAGNOSIS — F319 Bipolar disorder, unspecified: Secondary | ICD-10-CM

## 2011-12-24 MED ORDER — BUPROPION HCL ER (XL) 300 MG PO TB24: 300.0000 mg | ORAL_TABLET | Freq: Every day | ORAL | Status: DC

## 2011-12-24 NOTE — Progress Notes (Signed)
   [facility] Follow-up Outpatient Visit  Sarah Barry 29-Sep-1970  Date: 12/24/2011   Subjective: Sarah Barry presents today to followup on her treatment for bipolar disorder. When asked how she was doing she stated "I could be better." She feels that her depression has increased, and she hasn't felt no real difference since we started her on Wellbutrin. She denies any side effects. She reports that she wants to sleep excessively. She describes a schedule that would be not conducive to regular sleep pattern. She reports that she has no sex drive, poor concentration, poor motivation, and a desire to isolate. She also endorses a lack of interest, as well as anhedonia. She denies any suicidal or homicidal ideation. She denies any auditory or visual hallucinations.  There were no vitals filed for this visit.  Mental Status Examination  Appearance: Well groomed and dressed in her work clothes Alert: Yes Attention: good  Cooperative: Yes Eye Contact: Good Speech: Clear and coherent Psychomotor Activity: Normal Memory/Concentration: Intact Oriented: person, place, time/date and situation Mood: Dysphoric Affect: Congruent Thought Processes and Associations: Linear Fund of Knowledge: Good Thought Content: Normal Insight: Good Judgement: Good  Diagnosis: Bipolar disorder, currently depressed  Treatment Plan: We will increase her Wellbutrin XL to 300 mg daily. She is encouraged to begin getting more exercise. She will return for followup in 2 months, and she is encouraged to call in one month to report on her progress. We will consider Abilify when she has health care insurance that covers prescriptions.  Asti Mackley, PA-C

## 2011-12-31 ENCOUNTER — Ambulatory Visit (INDEPENDENT_AMBULATORY_CARE_PROVIDER_SITE_OTHER): Payer: Self-pay | Admitting: Psychiatry

## 2011-12-31 DIAGNOSIS — F3160 Bipolar disorder, current episode mixed, unspecified: Secondary | ICD-10-CM

## 2011-12-31 NOTE — Progress Notes (Signed)
   THERAPIST PROGRESS NOTE  Session Time: 2:00-2:50 pm  Participation Level: Active  Behavioral Response: CasualAlertDepressed  Type of Therapy: Individual Therapy  Treatment Goals addressed: Coping  Interventions: Solution Focused and Supportive  Summary: Sarah Barry is a 41 y.o. female who presents with moderate depressed mood and affect. She reports having 7/9 symptoms of depression with increased fatigue, weight gain, low energy and poor concentration and focus. She brought a list of current stressors to explore and prioritize during the session. She chose her most stressful one that she felt needed her current attention and used the session to problem solve and work towards resolution. Patient contacted [employer] to explore barriers to enrolling in classes this January. 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    Suicidal/Homicidal: Nowithout intent/plan  Therapist Response: Taught problem solution skills regarding organizing main stressors and creating an action plan to focus on reducing them. Modeled how to contacted [employer] to explore barriers to enrollment.   Plan: Return again in 1 weeks. Follow up with list of stressors and problem solution skills. Follow up with patient enrolling at [facility]  Diagnosis: Axis I: Bipolar I Mixed, most recent episode Depressed    Axis II: No diagnosis    Carman Ching, LCSW 12/31/2011

## 2012-01-07 ENCOUNTER — Ambulatory Visit (HOSPITAL_COMMUNITY): Payer: Self-pay | Admitting: Psychiatry

## 2012-01-07 NOTE — Patient Instructions (Addendum)
Discussed patients previous diagnosis of Bipolar I Disorder that was carried over from a previous provider. Symptoms were re-evaluated today to create current diagnosis of Depression Major, Recurrent, Moderate. Patient agrees to discuss this change with her PA, Jorje Guild at her next visit with him.

## 2012-01-14 ENCOUNTER — Ambulatory Visit (HOSPITAL_COMMUNITY): Payer: Self-pay | Admitting: Psychiatry

## 2012-01-21 ENCOUNTER — Other Ambulatory Visit (HOSPITAL_COMMUNITY): Payer: Self-pay | Admitting: Physician Assistant

## 2012-01-21 MED ORDER — ARIPIPRAZOLE 5 MG PO TABS: 5.0000 mg | ORAL_TABLET | Freq: Every day | ORAL | Status: DC

## 2012-01-21 NOTE — Telephone Encounter (Signed)
Sarah Barry called to report her insurance is now active, and she is ready to try and Abilify as she has means to afford it. We'll start her on Abilify 5 mg daily, but have her take 2.5 mg for the first week. Prescription sent to [facility] pharmacy on [address].

## 2012-01-22 ENCOUNTER — Other Ambulatory Visit: Payer: Self-pay | Admitting: Gastroenterology

## 2012-01-22 MED ORDER — LINACLOTIDE 290 MCG PO CAPS: 1.0000 | ORAL_CAPSULE | Freq: Every day | ORAL | Status: DC

## 2012-01-23 ENCOUNTER — Other Ambulatory Visit: Payer: Self-pay | Admitting: Family Medicine

## 2012-01-23 ENCOUNTER — Other Ambulatory Visit (HOSPITAL_COMMUNITY): Payer: Self-pay | Admitting: Physician Assistant

## 2012-01-23 ENCOUNTER — Encounter: Payer: Self-pay | Admitting: Family Medicine

## 2012-01-23 ENCOUNTER — Ambulatory Visit (INDEPENDENT_AMBULATORY_CARE_PROVIDER_SITE_OTHER): Payer: 59 | Admitting: Family Medicine

## 2012-01-23 VITALS — BP 110/72 | HR 80 | Ht 60.0 in | Wt 158.0 lb

## 2012-01-23 DIAGNOSIS — K219 Gastro-esophageal reflux disease without esophagitis: Secondary | ICD-10-CM

## 2012-01-23 DIAGNOSIS — Z23 Encounter for immunization: Secondary | ICD-10-CM

## 2012-01-23 DIAGNOSIS — E7800 Pure hypercholesterolemia, unspecified: Secondary | ICD-10-CM

## 2012-01-23 DIAGNOSIS — D509 Iron deficiency anemia, unspecified: Secondary | ICD-10-CM

## 2012-01-23 DIAGNOSIS — K5900 Constipation, unspecified: Secondary | ICD-10-CM

## 2012-01-23 DIAGNOSIS — R5383 Other fatigue: Secondary | ICD-10-CM

## 2012-01-23 DIAGNOSIS — Z79899 Other long term (current) drug therapy: Secondary | ICD-10-CM

## 2012-01-23 DIAGNOSIS — R5381 Other malaise: Secondary | ICD-10-CM

## 2012-01-23 DIAGNOSIS — D649 Anemia, unspecified: Secondary | ICD-10-CM

## 2012-01-23 DIAGNOSIS — Z0000 Encounter for general adult medical examination without abnormal findings: Secondary | ICD-10-CM

## 2012-01-23 DIAGNOSIS — F329 Major depressive disorder, single episode, unspecified: Secondary | ICD-10-CM | POA: Insufficient documentation

## 2012-01-23 LAB — COMPREHENSIVE METABOLIC PANEL
Albumin: 4.2 g/dL (ref 3.5–5.2)
BUN: 17 mg/dL (ref 6–23)
Calcium: 9.6 mg/dL (ref 8.4–10.5)
Chloride: 101 mEq/L (ref 96–112)
Creat: 0.75 mg/dL (ref 0.50–1.10)
Glucose, Bld: 82 mg/dL (ref 70–99)
Potassium: 4.4 mEq/L (ref 3.5–5.3)

## 2012-01-23 LAB — LIPID PANEL
Cholesterol: 157 mg/dL (ref 0–200)
Triglycerides: 120 mg/dL (ref <150)
VLDL: 24 mg/dL (ref 0–40)

## 2012-01-23 LAB — POCT URINALYSIS DIPSTICK
Glucose, UA: NEGATIVE
Ketones, UA: NEGATIVE
Leukocytes, UA: NEGATIVE
Protein, UA: NEGATIVE
Urobilinogen, UA: NEGATIVE

## 2012-01-23 LAB — CBC WITH DIFFERENTIAL/PLATELET
Basophils Relative: 1 % (ref 0–1)
HCT: 31.2 % — ABNORMAL LOW (ref 36.0–46.0)
Hemoglobin: 9.7 g/dL — ABNORMAL LOW (ref 12.0–15.0)
Lymphocytes Relative: 33 % (ref 12–46)
Lymphs Abs: 2.4 10*3/uL (ref 0.7–4.0)
MCHC: 31.1 g/dL (ref 30.0–36.0)
Monocytes Absolute: 0.5 10*3/uL (ref 0.1–1.0)
Monocytes Relative: 7 % (ref 3–12)
Neutro Abs: 4.2 10*3/uL (ref 1.7–7.7)
Neutrophils Relative %: 57 % (ref 43–77)
RBC: 4.3 MIL/uL (ref 3.87–5.11)
WBC: 7.3 10*3/uL (ref 4.0–10.5)

## 2012-01-23 LAB — IRON AND TIBC
TIBC: 446 ug/dL (ref 250–470)
UIBC: 436 ug/dL — ABNORMAL HIGH (ref 125–400)

## 2012-01-23 LAB — MAGNESIUM: Magnesium: 1.6 mg/dL (ref 1.5–2.5)

## 2012-01-23 NOTE — Progress Notes (Signed)
Chief Complaint  Patient presents with  . Annual Exam    new patient CPE no pap, is going to see Dr.Fontaine next week(to establish). Sarah Barry referref her here because she is having stomach problems-when she eats sugar her stomach is swelling. Also states she has hx of anemia and she is very fatigued. Also states that she is having acne breakouts on her forehead, scalp and behind her ears.   Sarah Barry is a 41 y.o. female who presents for a complete physical.  She has the following concerns:  Depression:  Followed by Maxcine Ham (therapist), and Dr. Elmon Kirschner.  Planning to start Abilify today.  Depression isn't controlled adequately with wellbutrin and citalopram alone.    Complaining of bloating and abdominal pain, worse after eating sweets.  She had been off Linzess for a few weeks, and pain and constipation got better after restarting, but still had bloating after eating sweets).  Denies blood in stools.  Has been told she was anemic, and colonoscopy is scheduled; but also has heavy periods which may be causing anemia.  Was due to have labs done, and apparently were drawn, but never sent out prior to [facility] closing.    GERD--controlled with Nexium, has recurrent symptoms if she misses pills.  She complains of feeling tired all the time.  Also complaining of acne and boils on forehead and scalp for just the last few weeks.  Occurred  about a year ago also, similarly, treated with antibiotics, but has recurred since she quit smoking.  She had one just behind/below her right ear that was very painful, but has gotten better.  Denies any new skin products (cleansers, moisturizers, makeup, shampoo, etc)  Health Maintenance: Immunization History  Administered Date(s) Administered  . Influenza Split 11/19/2011  thinks last tetanus was >10 years ago Last Pap smear: has appt with Dr. Audie Box next week.  Last pap 1 year ago Last mammogram: 09/2010 Last colonoscopy: never.  Has seen  Dr. Russella Dar in the past, and colonoscopy was recommended Last DEXA: never Dentist: yesterday Ophtho: yearly, wears glasses Exercise: none  Past Medical History  Diagnosis Date  . GERD (gastroesophageal reflux disease)   . Allergic rhinitis   . Folliculitis   . Chronic headache   . Thrombocytosis   . Bipolar disorder   . Positive H. pylori test   . Depression     Past Surgical History  Procedure Date  . Cholecystectomy   . Skin cancer excision     buttock area, Dr. Carolynne Edouard    History   Social History  . Marital Status: Single    Spouse Name: N/A    Number of Children: 0  . Years of Education: N/A   Occupational History  . CNA at [employer]   .     Social History Main Topics  . Smoking status: Former Smoker -- 1.0 packs/day for 13 years    Types: Cigarettes    Quit date: 01/08/2012  . Smokeless tobacco: Never Used  . Alcohol Use: No  . Drug Use: No  . Sexually Active: Not Currently   Other Topics Concern  . Not on file   Social History Narrative   Single.  Lives alone, has 1 dog.    Family History  Problem Relation Age of Onset  . Colon cancer Maternal Grandfather   . Cancer Maternal Grandfather     colon cancer  . Diabetes Paternal Grandmother   . Alcohol abuse Father   . Physical abuse Father   .  Heart disease Father 56    MI  . Hyperlipidemia Father   . Hypertension Father     Current outpatient prescriptions:buPROPion (WELLBUTRIN XL) 300 MG 24 hr tablet, Take 1 tablet (300 mg total) by mouth daily., Disp: 90 tablet, Rfl: 0;  citalopram (CELEXA) 40 MG tablet, Take 1 tablet (40 mg total) by mouth at bedtime., Disp: 90 tablet, Rfl: 0;  esomeprazole (NEXIUM) 40 MG capsule, Take 1 capsule (40 mg total) by mouth 2 (two) times daily., Disp: 60 capsule, Rfl: 11 Linaclotide (LINZESS) 290 MCG CAPS, Take 1 capsule by mouth daily., Disp: 30 capsule, Rfl: 0;  Multiple Vitamins-Minerals (MULTIVITAMINS THER. W/MINERALS) TABS, Take 1 tablet by mouth daily.   , Disp: , Rfl: ;  ARIPiprazole (ABILIFY) 5 MG tablet, Take 1 tablet (5 mg total) by mouth daily., Disp: 30 tablet, Rfl: 0;  [DISCONTINUED] QUEtiapine (SEROQUEL) 100 MG tablet, Take 150 mg by mouth at bedtime.  , Disp: , Rfl:   No Known Allergies  ROS:  The patient denies anorexia, fever, weight changes, headaches,  vision changes, decreased hearing, ear pain, sore throat, breast concerns, chest pain, palpitations, dizziness, syncope, dyspnea on exertion, cough, swelling, nausea, vomiting, diarrhea, melena, hematochezia, indigestion/heartburn, hematuria, incontinence, dysuria, irregular menstrual cycles (but they are heavy), vaginal discharge, odor or itch, genital lesions, joint pains, numbness, tingling, weakness, tremor, suspicious skin lesions, abnormal bleeding/bruising, or enlarged lymph nodes. Tingling in fingertips with holding phone, steering wheel, 2nd-4th fingers, both hands. Some pain and swelling in low back  PHYSICAL EXAM:  BP 110/72  Pulse 80  Ht 5' (1.524 m)  Wt 158 lb (71.668 kg)  BMI 30.86 kg/m2  LMP 01/10/2012  General Appearance:    Alert, cooperative, no distress, appears somewhat older than stated age  Head:    Normocephalic, without obvious abnormality, atraumatic  Eyes:    PERRL, conjunctiva/corneas clear, EOM's intact, fundi    benign  Ears:    Normal TM's and external ear canals  Nose:   Nares normal, mucosa normal, no drainage or sinus   tenderness  Throat:   Lips, mucosa, and tongue normal; teeth and gums normal  Neck:   Supple, no lymphadenopathy;  thyroid:  no   enlargement/tenderness/nodules; no carotid   bruit or JVD  Back:    Spine nontender, no curvature, ROM normal, no CVA     Tenderness.  There is a soft tissue swelling overlying lumbar spine, nontender.  This is the area that will sometimes hurt, swell.   Lungs:     Clear to auscultation bilaterally without wheezes, rales or     ronchi; respirations unlabored  Chest Wall:    No tenderness or deformity    Heart:    Regular rate and rhythm, S1 and S2 normal, no murmur, rub   or gallop  Breast Exam:    Deferred to GYN  Abdomen:     Soft, non-tender, nondistended, normoactive bowel sounds,    no masses, no hepatosplenomegaly  Genitalia:    Deferred to GYN     Extremities:   No clubbing, cyanosis or edema  Pulses:   2+ and symmetric all extremities  Skin:   Skin color, texture, turgor normal, no rashes or lesions  Lymph nodes:   Cervical, supraclavicular, and axillary nodes normal  Neurologic:   CNII-XII intact, normal strength, sensation and gait; reflexes 2+ and symmetric throughout          Psych:   Normal mood, affect, hygiene and grooming.    ASSESSMENT/PLAN:  1.  Routine general medical examination at a health care facility  Visual acuity screening, POCT Urinalysis Dipstick  2. Need for Tdap vaccination  Tdap vaccine greater than or equal to 7yo IM  3. Depressive disorder, not elsewhere classified    4. GERD (gastroesophageal reflux disease)    5. Anemia  Ferritin, IBC panel, B12, Folate  6. Other malaise and fatigue  Comprehensive metabolic panel, CBC with Differential, Vitamin D 25 hydroxy, TSH  7. Constipation  TSH  8. Anemia, unspecified  CBC with Differential  9. Pure hypercholesterolemia  Lipid panel  10. Encounter for long-term (current) use of other medications  Magnesium  11. Iron deficiency anemia     Discussed monthly self breast exams and yearly mammograms after the age of 91; at least 30 minutes of aerobic activity at least 5 days/week; proper sunscreen use reviewed; healthy diet, including goals of calcium and vitamin D intake and alcohol recommendations (less than or equal to 1 drink/day) reviewed; regular seatbelt use; changing batteries in smoke detectors.  Immunization recommendations discussed-TdaP today.  Colonoscopy recommendations reviewed--has appt later this month.  I agree with recommendation given family h/o cancer, abdominal complaints and iron deficiency  anemia (although this may be related to heavy menstrual bleeding).  Consider OCP's to help both menstrual bleeding and acne.  Discuss with GYN (ie ortho tricyclen or -lo) at her upcoming appointment Acne may also be related to stress response, and might resolve on its own.  Reviewed OTC med she can try for acne (containing benzoyl peroxide or salycilic acid, consider trying Proactiv).  Lesion behind her ear (and likely those she complains of in scalp, but were not palpable/present on exam today) are sebaceous cysts; no current infected cyst noted.

## 2012-01-23 NOTE — Patient Instructions (Addendum)
HEALTH MAINTENANCE RECOMMENDATIONS:  It is recommended that you get at least 30 minutes of aerobic exercise at least 5 days/week (for weight loss, you may need as much as 60-90 minutes). This can be any activity that gets your heart rate up. This can be divided in 10-15 minute intervals if needed, but try and build up your endurance at least once a week.  Weight bearing exercise is also recommended twice weekly.  Eat a healthy diet with lots of vegetables, fruits and fiber.  "Colorful" foods have a lot of vitamins (ie green vegetables, tomatoes, red peppers, etc).  Limit sweet tea, regular sodas and alcoholic beverages, all of which has a lot of calories and sugar.  Up to 1 alcoholic drink daily may be beneficial for women (unless trying to lose weight, watch sugars).  Drink a lot of water.  Calcium recommendations are 1200-1500 mg daily (1500 mg for postmenopausal women or women without ovaries), and vitamin D 1000 IU daily.  This should be obtained from diet and/or supplements (vitamins), and calcium should not be taken all at once, but in divided doses.  Monthly self breast exams and yearly mammograms for women over the age of 28 is recommended.  Sunscreen of at least SPF 30 should be used on all sun-exposed parts of the skin when outside between the hours of 10 am and 4 pm (not just when at beach or pool, but even with exercise, golf, tennis, and yard work!)  Use a sunscreen that says "broad spectrum" so it covers both UVA and UVB rays, and make sure to reapply every 1-2 hours.  Remember to change the batteries in your smoke detectors when changing your clock times in the spring and fall.  Use your seat belt every time you are in a car, and please drive safely and not be distracted with cell phones and texting while driving.   Return to further discuss facial acne if you end up not doing any hormonal (birth control pills) to regular/lighten your cycles (and the need for this will depend on the  severity of your anemia, and your choice, of course).

## 2012-01-24 DIAGNOSIS — D509 Iron deficiency anemia, unspecified: Secondary | ICD-10-CM | POA: Insufficient documentation

## 2012-01-24 LAB — TSH: TSH: 1.506 u[IU]/mL (ref 0.350–4.500)

## 2012-01-24 LAB — FERRITIN: Ferritin: 2 ng/mL — ABNORMAL LOW (ref 10–291)

## 2012-01-24 LAB — VITAMIN D 25 HYDROXY (VIT D DEFICIENCY, FRACTURES): Vit D, 25-Hydroxy: 55 ng/mL (ref 30–89)

## 2012-01-27 ENCOUNTER — Other Ambulatory Visit: Payer: Self-pay | Admitting: *Deleted

## 2012-01-27 ENCOUNTER — Ambulatory Visit (HOSPITAL_COMMUNITY): Payer: Self-pay | Admitting: Psychiatry

## 2012-01-27 DIAGNOSIS — D649 Anemia, unspecified: Secondary | ICD-10-CM

## 2012-01-27 DIAGNOSIS — D509 Iron deficiency anemia, unspecified: Secondary | ICD-10-CM

## 2012-01-27 MED ORDER — FERROUS SULFATE 324 (65 FE) MG PO TBEC: 1.0000 | DELAYED_RELEASE_TABLET | Freq: Two times a day (BID) | ORAL | Status: DC

## 2012-01-29 ENCOUNTER — Ambulatory Visit: Payer: Self-pay | Admitting: Gynecology

## 2012-02-03 ENCOUNTER — Telehealth: Payer: Self-pay | Admitting: Internal Medicine

## 2012-02-03 ENCOUNTER — Encounter: Payer: Self-pay | Admitting: Gastroenterology

## 2012-02-03 ENCOUNTER — Ambulatory Visit (AMBULATORY_SURGERY_CENTER): Payer: 59 | Admitting: *Deleted

## 2012-02-03 VITALS — Ht 60.0 in | Wt 161.8 lb

## 2012-02-03 DIAGNOSIS — R143 Flatulence: Secondary | ICD-10-CM

## 2012-02-03 DIAGNOSIS — K5900 Constipation, unspecified: Secondary | ICD-10-CM

## 2012-02-03 DIAGNOSIS — D509 Iron deficiency anemia, unspecified: Secondary | ICD-10-CM

## 2012-02-03 DIAGNOSIS — R141 Gas pain: Secondary | ICD-10-CM

## 2012-02-03 MED ORDER — MOVIPREP 100 G PO SOLR: ORAL | Status: DC

## 2012-02-03 NOTE — Telephone Encounter (Signed)
Left a message for patient to let her know we can give her samples of Linzess til her appointment for a Colonoscopy or we can give her Amitiza to try and she can call her insurance company to see if it is covered. Told her to call me back to let me know what she wants to do.

## 2012-02-03 NOTE — Telephone Encounter (Addendum)
Patient states her insurance company will not cover Linzess and she states she can tell a difference when she is not taking this medication. Patient wants to know what else she can take for constipation that her insurance company will cover. Told patient that we are not sure what her insurance will cover so needs to call her insurance company to find out what the preferred medicines are for constipation. Told her to check on Amitiza. She is scheduled for Colonoscopy the end of this month as recommended in last office visit. Patient states she was waiting to get insurance. Told patient to keep Colonoscopy appt and I will ask the doctor what else can be recommended and give her some more samples if need be.

## 2012-02-03 NOTE — Telephone Encounter (Signed)
OK. Linzess or Amitiza samples for now.

## 2012-02-04 NOTE — Telephone Encounter (Signed)
Left another message for patient to return my call.

## 2012-02-10 ENCOUNTER — Encounter: Payer: Self-pay | Admitting: Family Medicine

## 2012-02-10 ENCOUNTER — Telehealth: Payer: Self-pay | Admitting: *Deleted

## 2012-02-10 ENCOUNTER — Encounter: Payer: Self-pay | Admitting: Gastroenterology

## 2012-02-10 NOTE — Telephone Encounter (Signed)
Left message for patient to return my call.

## 2012-02-10 NOTE — Telephone Encounter (Signed)
Called pt and went over Dr.Knapp's recommendations.

## 2012-02-10 NOTE — Telephone Encounter (Signed)
Called patient in response to her pt advice request, she stated that she has been experiencing dizzy spells while doing aerobics over the past 2 weeks. The only other symptom she has is that she has had a stuffy nose over the last few weeks. The dizziness does seem to get worse when she bends over. Please advise. Thanks.

## 2012-02-10 NOTE — Telephone Encounter (Signed)
This could be related to either dehydration, or her anemia (hg was 9.7. Ensure drinking plenty of fluids prior to and during exercise, and taking iron as recommended.

## 2012-02-17 ENCOUNTER — Encounter: Payer: Self-pay | Admitting: Gastroenterology

## 2012-02-18 ENCOUNTER — Encounter: Payer: Self-pay | Admitting: Family Medicine

## 2012-02-19 HISTORY — PX: COLPOSCOPY: SHX161

## 2012-02-21 ENCOUNTER — Ambulatory Visit (HOSPITAL_COMMUNITY): Payer: Self-pay | Admitting: Psychiatry

## 2012-02-24 ENCOUNTER — Other Ambulatory Visit (HOSPITAL_COMMUNITY): Payer: Self-pay | Admitting: Physician Assistant

## 2012-02-27 ENCOUNTER — Encounter: Payer: Self-pay | Admitting: *Deleted

## 2012-03-02 ENCOUNTER — Ambulatory Visit (INDEPENDENT_AMBULATORY_CARE_PROVIDER_SITE_OTHER): Payer: 59 | Admitting: Physician Assistant

## 2012-03-02 DIAGNOSIS — F3132 Bipolar disorder, current episode depressed, moderate: Secondary | ICD-10-CM

## 2012-03-02 DIAGNOSIS — F319 Bipolar disorder, unspecified: Secondary | ICD-10-CM

## 2012-03-02 NOTE — Progress Notes (Signed)
   [facility] Follow-up Outpatient Visit  SHANIKA LEVINGS [birth year]  Date: 03/02/2012   Subjective: Keighley presents today to followup on her treatment for bipolar disorder. At her last appointment we increased her Wellbutrin XL to 300 mg daily. Since then her insurance has taken effect, and we were able to start Abilify, and she is currently taking 5 mg daily. She is currently exercising 3-4 times per week, and her trainer has her focusing on her diet and how it affects her emotions. She reports that she is experiencing a significant amount of restlessness, and states that she looks forward to going somewhere, but when she gets there she can wait until she can leave. She states that she is sleeping well except she wakes up at 3 AM, and sometimes has a full-time going back to sleep, and may be up for a couple of hours before she does go back to sleep. She is currently going to meetings Monday through Friday at 5:30. She continues to work at [employer], and has been there for 8 months. She denies any suicidal or homicidal ideation. She denies any auditory or visual hallucinations.  There were no vitals filed for this visit.  Mental Status Examination  Appearance: Casual Alert: Yes Attention: good  Cooperative: Yes Eye Contact: Good Speech: Clear and coherent Psychomotor Activity: Normal Memory/Concentration: Intact Oriented: person, place, time/date and situation Mood: Anxious Affect: Congruent Thought Processes and Associations: Linear Fund of Knowledge: Good Thought Content: Normal Insight: Good Judgement: Good  Diagnosis: Bipolar disorder NOS  Treatment Plan: We will try reducing the Abilify to 2.5 mg to treat her possible akathisia. She is to call in a week to 2 weeks to report on that trial. If depression increases and or her akathisia does not relieve, we will again increase the Abilify to 5 mg, and tried decreasing the Wellbutrin by one half. She will return for  followup visit in 2 months.  Jasim Harari, PA-C

## 2012-03-05 ENCOUNTER — Ambulatory Visit (HOSPITAL_COMMUNITY): Payer: Self-pay | Admitting: Psychiatry

## 2012-03-12 ENCOUNTER — Ambulatory Visit (HOSPITAL_COMMUNITY): Payer: Self-pay | Admitting: Psychiatry

## 2012-03-18 ENCOUNTER — Telehealth (HOSPITAL_COMMUNITY): Payer: Self-pay

## 2012-03-19 ENCOUNTER — Ambulatory Visit (HOSPITAL_COMMUNITY): Payer: Self-pay | Admitting: Psychiatry

## 2012-03-21 ENCOUNTER — Encounter: Payer: Self-pay | Admitting: Family Medicine

## 2012-03-21 ENCOUNTER — Encounter: Payer: Self-pay | Admitting: Gastroenterology

## 2012-03-21 DIAGNOSIS — K5900 Constipation, unspecified: Secondary | ICD-10-CM

## 2012-03-23 ENCOUNTER — Telehealth (HOSPITAL_COMMUNITY): Payer: Self-pay

## 2012-03-23 ENCOUNTER — Encounter: Payer: Self-pay | Admitting: Gastroenterology

## 2012-03-23 MED ORDER — LUBIPROSTONE 24 MCG PO CAPS: 24.0000 ug | ORAL_CAPSULE | Freq: Two times a day (BID) | ORAL | Status: DC

## 2012-03-23 NOTE — Telephone Encounter (Signed)
Can patient have Amitiza prescription. Linzess was not covered by insurance but patient also cancelled her Colonoscopy for 02/17/12. Please let me know what you would like me to do.

## 2012-03-23 NOTE — Telephone Encounter (Signed)
Notified patient that she needs to schedule Colonoscopy in February. Will mail her stool Hemoccult cards and send one refill to her pharmacy for Amitiza.

## 2012-03-23 NOTE — Telephone Encounter (Addendum)
Per Dr. Russella Dar: She needs to schedule colonoscopy in February. Please send home stool hemmocults if not done within the last few months Please have her work with accounting to help her find a way to complete the colonoscopy and cover the deductible. She has fe deficiency which needs to be followed by her PCP. We can refill Amitza for 1 month. It is not wise to continue to treat her without clearing her colon with a colonoscopy.

## 2012-03-23 NOTE — Telephone Encounter (Signed)
Returned patient's call concerning decrease in medication and response. Patient reports that cutting back on dose of Abilify has helped her to feel less restless. Patient wants to know if she can cut back on the Wellbutrin or Celexa. Reports that she still not sleeping well through the night. We will decrease Wellbutrin XL to 150 mg, and continue Celexa 40 mg at bedtime.

## 2012-03-25 ENCOUNTER — Ambulatory Visit: Payer: Self-pay

## 2012-03-26 ENCOUNTER — Ambulatory Visit (HOSPITAL_COMMUNITY): Payer: Self-pay | Admitting: Psychiatry

## 2012-03-27 ENCOUNTER — Other Ambulatory Visit: Payer: 59

## 2012-03-27 DIAGNOSIS — D649 Anemia, unspecified: Secondary | ICD-10-CM

## 2012-03-30 ENCOUNTER — Other Ambulatory Visit: Payer: Self-pay | Admitting: *Deleted

## 2012-03-30 DIAGNOSIS — D649 Anemia, unspecified: Secondary | ICD-10-CM

## 2012-03-30 DIAGNOSIS — D509 Iron deficiency anemia, unspecified: Secondary | ICD-10-CM

## 2012-03-30 LAB — IRON: Iron: 19 ug/dL — ABNORMAL LOW (ref 42–145)

## 2012-03-30 MED ORDER — FERROUS SULFATE 324 (65 FE) MG PO TBEC: 1.0000 | DELAYED_RELEASE_TABLET | Freq: Two times a day (BID) | ORAL | Status: DC

## 2012-04-07 ENCOUNTER — Ambulatory Visit (AMBULATORY_SURGERY_CENTER): Payer: 59

## 2012-04-07 ENCOUNTER — Encounter: Payer: Self-pay | Admitting: Gastroenterology

## 2012-04-07 VITALS — Ht 60.0 in | Wt 166.2 lb

## 2012-04-07 DIAGNOSIS — K5900 Constipation, unspecified: Secondary | ICD-10-CM

## 2012-04-07 DIAGNOSIS — R143 Flatulence: Secondary | ICD-10-CM

## 2012-04-07 DIAGNOSIS — Z800 Family history of malignant neoplasm of digestive organs: Secondary | ICD-10-CM

## 2012-04-07 MED ORDER — MOVIPREP 100 G PO SOLR: ORAL | Status: DC

## 2012-04-12 ENCOUNTER — Other Ambulatory Visit (HOSPITAL_COMMUNITY): Payer: Self-pay | Admitting: Physician Assistant

## 2012-04-14 ENCOUNTER — Encounter: Payer: Self-pay | Admitting: Internal Medicine

## 2012-04-14 ENCOUNTER — Telehealth (HOSPITAL_COMMUNITY): Payer: Self-pay

## 2012-04-14 MED ORDER — ARIPIPRAZOLE 5 MG PO TABS: 2.5000 mg | ORAL_TABLET | Freq: Every day | ORAL | Status: DC

## 2012-04-14 NOTE — Telephone Encounter (Signed)
Sent authorization to pharmacy for Abilify 2.5 mg daily.

## 2012-04-18 DIAGNOSIS — K635 Polyp of colon: Secondary | ICD-10-CM

## 2012-04-18 HISTORY — PX: COLONOSCOPY: SHX174

## 2012-04-18 HISTORY — DX: Polyp of colon: K63.5

## 2012-04-21 ENCOUNTER — Ambulatory Visit (AMBULATORY_SURGERY_CENTER): Payer: 59 | Admitting: Gastroenterology

## 2012-04-21 ENCOUNTER — Encounter: Payer: Self-pay | Admitting: Gastroenterology

## 2012-04-21 VITALS — BP 109/70 | HR 65 | Temp 98.1°F | Resp 19 | Ht 60.0 in | Wt 166.0 lb

## 2012-04-21 DIAGNOSIS — D126 Benign neoplasm of colon, unspecified: Secondary | ICD-10-CM

## 2012-04-21 LAB — HM COLONOSCOPY

## 2012-04-21 MED ORDER — SODIUM CHLORIDE 0.9 % IV SOLN
500.0000 mL | INTRAVENOUS | Status: DC
Start: 2012-04-21 — End: 2012-04-21

## 2012-04-21 NOTE — Progress Notes (Signed)
Patient did not have preoperative order for IV antibiotic SSI prophylaxis. (G8918)  Patient did not experience any of the following events: a burn prior to discharge; a fall within the facility; wrong site/side/patient/procedure/implant event; or a hospital transfer or hospital admission upon discharge from the facility. (G8907)  

## 2012-04-21 NOTE — Op Note (Signed)
[facility] [address] [place], [address]   COLONOSCOPY PROCEDURE REPORT  PATIENT: Sarah Barry, Sarah Barry  MR#: [id number] BIRTHDATE: 1970-04-14 , 41  yrs. old GENDER: Female ENDOSCOPIST: Meryl Dare, MD, [facility] REFERRED Edison Simon, M.D. PROCEDURE DATE:  04/21/2012 PROCEDURE:   Colonoscopy with snare polypectomy ASA CLASS:   Class II INDICATIONS:Iron Deficiency Anemia and Constipation. MEDICATIONS: MAC sedation, administered by CRNA, propofol (Diprivan) 300mg  IV DESCRIPTION OF PROCEDURE:   After the risks benefits and alternatives of the procedure were thoroughly explained, informed consent was obtained.  A digital rectal exam revealed no abnormalities of the rectum.   The LB [id number]  endoscope was introduced through the anus and advanced to the cecum, which was identified by both the appendix and ileocecal valve. No adverse events experienced.   The quality of the prep was good, using MoviPrep  The instrument was then slowly withdrawn as the colon was fully examined.  COLON FINDINGS: A sessile polyp measuring 6 mm in size was found in the transverse colon.   The colon was otherwise normal.  There was no diverticulosis, inflammation, polyps or cancers unless previously stated.  Retroflexed views revealed no abnormalities. The time to cecum=1 minutes 09 seconds.  Withdrawal time=8 minutes 25 seconds.  The scope was withdrawn and the procedure completed.  COMPLICATIONS: There were no complications.  ENDOSCOPIC IMPRESSION: 1.   Sessile polyp measuring 6 mm in size was found in the transverse colon 2.   The colon was otherwise normal  RECOMMENDATIONS: 1.  Await pathology results 2.  Repeat colonoscopy in 5 years if polyp adenomatous; otherwise 10 years 3.  Continue Linzess for constipation  eSigned:  Meryl Dare, MD, [facility] 04/21/2012 10:57 AM

## 2012-04-21 NOTE — Patient Instructions (Signed)
YOU HAD AN ENDOSCOPIC PROCEDURE TODAY AT THE [facility]: Refer to the procedure report that was given to you for any specific questions about what was found during the examination.  If the procedure report does not answer your questions, please call your gastroenterologist to clarify.  If you requested that your care partner not be given the details of your procedure findings, then the procedure report has been included in a sealed envelope for you to review at your convenience later.  YOU SHOULD EXPECT: Some feelings of bloating in the abdomen. Passage of more gas than usual.  Walking can help get rid of the air that was put into your GI tract during the procedure and reduce the bloating. If you had a lower endoscopy (such as a colonoscopy or flexible sigmoidoscopy) you may notice spotting of blood in your stool or on the toilet paper. If you underwent a bowel prep for your procedure, then you may not have a normal bowel movement for a few days.  DIET: Your first meal following the procedure should be a light meal and then it is ok to progress to your normal diet.  A half-sandwich or bowl of soup is an example of a good first meal.  Heavy or fried foods are harder to digest and may make you feel nauseous or bloated.  Likewise meals heavy in dairy and vegetables can cause extra gas to form and this can also increase the bloating.  Drink plenty of fluids but you should avoid alcoholic beverages for 24 hours.  ACTIVITY: Your care partner should take you home directly after the procedure.  You should plan to take it easy, moving slowly for the rest of the day.  You can resume normal activity the day after the procedure however you should NOT DRIVE or use heavy machinery for 24 hours (because of the sedation medicines used during the test).    SYMPTOMS TO REPORT IMMEDIATELY: A gastroenterologist can be reached at any hour.  During normal business hours, 8:30 AM to 5:00 PM Monday through Friday,  call [phone].  After hours and on weekends, please call the GI answering service at [phone] who will take a message and have the physician on call contact you.   Following lower endoscopy (colonoscopy or flexible sigmoidoscopy):  Excessive amounts of blood in the stool  Significant tenderness or worsening of abdominal pains  Swelling of the abdomen that is new, acute  Fever of 100F or higher    FOLLOW UP: If any biopsies were taken you will be contacted by phone or by letter within the next 1-3 weeks.  Call your gastroenterologist if you have not heard about the biopsies in 3 weeks.  Our staff will call the home number listed on your records the next business day following your procedure to check on you and address any questions or concerns that you may have at that time regarding the information given to you following your procedure. This is a courtesy call and so if there is no answer at the home number and we have not heard from you through the emergency physician on call, we will assume that you have returned to your regular daily activities without incident.  SIGNATURES/CONFIDENTIALITY: You and/or your care partner have signed paperwork which will be entered into your electronic medical record.  These signatures attest to the fact that that the information above on your After Visit Summary has been reviewed and is understood.  Full responsibility of the confidentiality   of this discharge information lies with you and/or your care-partner.    Information on polyps given to you today 

## 2012-04-22 ENCOUNTER — Telehealth: Payer: Self-pay | Admitting: *Deleted

## 2012-04-22 NOTE — Telephone Encounter (Signed)
Left message

## 2012-04-23 ENCOUNTER — Other Ambulatory Visit: Payer: 59

## 2012-04-23 LAB — CBC WITH DIFFERENTIAL/PLATELET
Basophils Absolute: 0.1 10*3/uL (ref 0.0–0.1)
HCT: 36.5 % (ref 36.0–46.0)
Hemoglobin: 12.2 g/dL (ref 12.0–15.0)
Lymphocytes Relative: 34 % (ref 12–46)
Monocytes Absolute: 0.6 10*3/uL (ref 0.1–1.0)
Neutro Abs: 4.8 10*3/uL (ref 1.7–7.7)
RDW: 18.4 % — ABNORMAL HIGH (ref 11.5–15.5)
WBC: 8.5 10*3/uL (ref 4.0–10.5)

## 2012-04-23 LAB — IRON: Iron: 204 ug/dL — ABNORMAL HIGH (ref 42–145)

## 2012-04-27 ENCOUNTER — Ambulatory Visit (INDEPENDENT_AMBULATORY_CARE_PROVIDER_SITE_OTHER): Payer: 59 | Admitting: Family Medicine

## 2012-04-27 ENCOUNTER — Encounter: Payer: Self-pay | Admitting: Family Medicine

## 2012-04-27 ENCOUNTER — Ambulatory Visit (INDEPENDENT_AMBULATORY_CARE_PROVIDER_SITE_OTHER): Payer: 59 | Admitting: Psychiatry

## 2012-04-27 ENCOUNTER — Encounter: Payer: Self-pay | Admitting: Gastroenterology

## 2012-04-27 VITALS — BP 110/74 | HR 88 | Ht 60.0 in | Wt 161.0 lb

## 2012-04-27 DIAGNOSIS — F17200 Nicotine dependence, unspecified, uncomplicated: Secondary | ICD-10-CM | POA: Insufficient documentation

## 2012-04-27 MED ORDER — ESOMEPRAZOLE MAGNESIUM 40 MG PO CPDR: 40.0000 mg | DELAYED_RELEASE_CAPSULE | Freq: Two times a day (BID) | ORAL | Status: DC

## 2012-04-27 NOTE — Patient Instructions (Addendum)
Please quit smoking.  Use 1800-QUITNOW or [place]Quitline.com for free counseling.  [facility] also has free smoking cessation, and your insurance may also have a program  Your anemia has improved, but iron stores are still low. Cut back on your iron to just once daily (don't take it twice daily anymore). After 2-3 months, if your periods remain normal (not heavy) you can cut back to only taking the iron once or twice daily just during the time that you are on your period.  Take it twice daily if period is very heavy, and you may even continue it for an additional week if the period was very heavy.

## 2012-04-27 NOTE — Progress Notes (Signed)
   THERAPIST PROGRESS NOTE  Session Time: 3:00-3:50 pm  Participation Level: Active  Behavioral Response: Neat and Well GroomedAlertDepressed  Type of Therapy: Individual Therapy  Treatment Goals addressed: Coping  Interventions: Solution Focused and Supportive  Summary: Sarah Barry is a 42 y.o. female who presents with euthymic mood and affect. This is patients first session in several months. She reports her depression being "very high" at a 9/10 but her symptoms sound more medical than depressive. Patient continues to report physical bloating as a main frustrating as she works with medical doctors to determine the cause and states she has low energy and exhaustion, which she wonders if it could be related to food allergies or "glueten intolerance". Patient agreed to further research this and explore different foods as the causes of her symptoms. Patient states she continues to struggle with getting everything in order to return to college to finish her degree and that is stressful.  She states she now has medical coverage at work and that is helpful, but finances continue to be difficult. Patient states her car is wearing down and in need of repairs frequently and this is getting expensive. Patient states she is in a new relationship with a man who she grew up with and they now live together. She said this is one area of her life that is going well. 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 Patient states her boyfriend is also sober and has been for the past twenty years due to being in prison for killing someone while drunk driving. Patient states they are both working their sobriety plans together.   Suicidal/Homicidal: Nowithout intent/plan  Therapist Response: Assessed status since last session given the time lapse. Explored current stressors and began prioritizing them for treatment.   Plan: Return again in 1 weeks.  Diagnosis: Axis I:  Bipolar disorder, mixed    Axis II: No diagnosis    Earle Burson E, LCSW 04/27/2012

## 2012-04-27 NOTE — Progress Notes (Signed)
Chief Complaint  Patient presents with  . Anemia    follow up, labs done 04/23/12.   Since last visit here in December, she was seen by GI and had colonoscopy last week.  Pathology from a polyp is pending (later noted to be tubular adenoma, after pt visit over). She also saw Dr. Geoffery Spruce had ultrasound which showed a fibroid and a uterine polyp.  Had discussed possible biopsy, but nothing is scheduled.  Her periods have been normal for the last couple of months--not heavy like it had been.   Continues to have low energy, and some lightheadedness. She also saw the dermatologist, who put her on doxycycline for 3 months for her acne.  She has been taking iron twice daily (except for the 5 days prior to colonoscopy).  +constipation, but controlled with medications.  She complains of ongoing fatigue.  She started back smoking again.  Past Medical History  Diagnosis Date  . GERD (gastroesophageal reflux disease)   . Allergic rhinitis   . Folliculitis   . Chronic headache     during cycle  . Thrombocytosis   . Bipolar disorder   . Positive H. pylori test   . Depression   . Fatigue   . Tongue disorder   . Anemia   . Cancer     skin ca - buttocks  . Colon polyp 04/2012    tubular adenoma   Past Surgical History  Procedure Laterality Date  . Cholecystectomy    . Skin cancer excision      buttock area, Dr. Carolynne Edouard  . Colonoscopy  04/2012    tubular adenoma   History   Social History  . Marital Status: Single    Spouse Name: N/A    Number of Children: 0  . Years of Education: N/A   Occupational History  . CNA at [employer]   .     Social History Main Topics  . Smoking status: Current Every Day Smoker -- 0.50 packs/day for 13 years    Types: Cigarettes  . Smokeless tobacco: Never Used  . Alcohol Use: No  . Drug Use: No  . Sexually Active: Not Currently   Other Topics Concern  . Not on file   Social History Narrative   Single.  Lives alone, has 1 dog.    Current Outpatient Prescriptions on File Prior to Visit  Medication Sig Dispense Refill  . ARIPiprazole (ABILIFY) 5 MG tablet Take 0.5 tablets (2.5 mg total) by mouth daily.  15 tablet  5  . buPROPion (WELLBUTRIN XL) 300 MG 24 hr tablet Take 1 tablet (300 mg total) by mouth daily.  90 tablet  0  . citalopram (CELEXA) 40 MG tablet Take 1 tablet (40 mg total) by mouth at bedtime.  90 tablet  0  . doxycycline (MONODOX) 100 MG capsule Take 100 mg by mouth daily.       . ferrous sulfate 324 (65 FE) MG TBEC Take 1 tablet (325 mg total) by mouth 2 (two) times daily. With meals.  60 tablet  1  . lubiprostone (AMITIZA) 24 MCG capsule Take 1 capsule (24 mcg total) by mouth 2 (two) times daily with a meal.  60 capsule  0  . Multiple Vitamins-Minerals (MULTIVITAMINS THER. W/MINERALS) TABS Take 1 tablet by mouth daily.        Marland Kitchen OVER THE COUNTER MEDICATION Vanish Fat Metabolizer-take 2 capsules bid      . OVER THE COUNTER MEDICATION Digestive enzymes- Take one with each meal      .  OVER THE COUNTER MEDICATION 5-HTP-Tale one daily      . oxyCODONE-acetaminophen (PERCOCET/ROXICET) 5-325 MG per tablet Take 1 tablet by mouth as needed.       Marland Kitchen acyclovir (ZOVIRAX) 200 MG capsule Take 200 mg by mouth every 4 (four) hours while awake.       Marland Kitchen OVER THE COUNTER MEDICATION Inflammotion-Take 2 pills daily      . [DISCONTINUED] QUEtiapine (SEROQUEL) 100 MG tablet Take 150 mg by mouth at bedtime.         No current facility-administered medications on file prior to visit.   No Known Allergies  ROS:  Denies fevers, URI symptoms, chest pain, shortness of breath, GI complaints, GU complaints, or other concerns, just fatigue.  PHYSICAL EXAM: BP 110/74  Pulse 88  Ht 5' (1.524 m)  Wt 161 lb (73.029 kg)  BMI 31.44 kg/m2  LMP 04/07/2012 Pleasant female, in no distress Neck: no lymphadenopathy, thyromegaly or mass Heart: regular rate and rhythm without murmur Lungs: clear bilaterally Extremities: no  edema Abdomen: soft, nontender, no organomegaly or mass Skin: no rash, bruising, lesions  Lab Results  Component Value Date   WBC 8.5 04/23/2012   HGB 12.2 04/23/2012   HCT 36.5 04/23/2012   MCV 80.9 04/23/2012   PLT 426* 04/23/2012    Ferritin 6 (low) Iron 204 (high)  ASSESSMENT/PLAN:  Iron deficiency anemia - anemia resolved.  Iron levels now high, ferritin still low  Tobacco use disorder  Please quit smoking.  Use 1800-QUITNOW or [place]Quitline.com for free counseling.  [facility] also has free smoking cessation, and your insurance may also have a program  Your anemia has improved, but iron stores are still low. Cut back on your iron to just once daily (don't take it twice daily anymore). After 2-3 months, if your periods remain normal (not heavy) you can cut back to only taking the iron once or twice daily just during the time that you are on your period.  Take it twice daily if period is very heavy, and you may even continue it for an additional week if the period was very heavy.   Plan to recheck at next CPE, sooner prn

## 2012-04-29 ENCOUNTER — Telehealth: Payer: Self-pay

## 2012-04-29 NOTE — Telephone Encounter (Signed)
Told patient we received the denial letter from her insurance company of Nexium 40 mg twice a day this morning. Patient states her [place] assistance program approved her to get the medicine but is not sure how much will be sent to her. Told her to call me back once she has received her medicine in the mail and then we can decide whether we need to change her PPI all together. Pt agreed and will call me back. Denial letter sent down to medical records to be scanned into system.

## 2012-05-04 ENCOUNTER — Ambulatory Visit (HOSPITAL_COMMUNITY): Payer: Self-pay | Admitting: Psychiatry

## 2012-05-04 ENCOUNTER — Other Ambulatory Visit: Payer: Self-pay | Admitting: Gastroenterology

## 2012-05-04 ENCOUNTER — Telehealth (HOSPITAL_COMMUNITY): Payer: Self-pay | Admitting: *Deleted

## 2012-05-04 NOTE — Telephone Encounter (Signed)
Information given to [facility], due to pt mood.  Chart reviewed by Dr Lolly Mustache in Alan's absence. Medication changes ordered. Contacted pt to inform, pt not available.Left message that would call tomorrow.

## 2012-05-06 MED ORDER — ARIPIPRAZOLE 5 MG PO TABS: 2.5000 mg | ORAL_TABLET | Freq: Two times a day (BID) | ORAL | Status: DC

## 2012-05-06 NOTE — Telephone Encounter (Signed)
Attempted contact with pt AM 3/18, no answer. Contacted pt on 3/18 PM to clarify Abilify dose. Patient states takes 2.5 mg (hald of 5 mg tablet) daily. Information given to Dr.Arfeen 3/19 AM. Dr. Lolly Mustache ordered increase in Abilify to 2.5 mg twice daily. Contacted pt to give instructions, pt verbalized understanding.

## 2012-05-11 ENCOUNTER — Ambulatory Visit (HOSPITAL_COMMUNITY): Payer: Self-pay | Admitting: Psychiatry

## 2012-05-12 ENCOUNTER — Ambulatory Visit (INDEPENDENT_AMBULATORY_CARE_PROVIDER_SITE_OTHER): Payer: 59 | Admitting: Physician Assistant

## 2012-05-12 DIAGNOSIS — F3132 Bipolar disorder, current episode depressed, moderate: Secondary | ICD-10-CM

## 2012-05-12 MED ORDER — ARIPIPRAZOLE 5 MG PO TABS: 2.5000 mg | ORAL_TABLET | Freq: Two times a day (BID) | ORAL | Status: DC

## 2012-05-12 MED ORDER — TRAZODONE HCL 50 MG PO TABS: 25.0000 mg | ORAL_TABLET | Freq: Every day | ORAL | Status: DC

## 2012-05-14 ENCOUNTER — Telehealth: Payer: Self-pay | Admitting: *Deleted

## 2012-05-14 ENCOUNTER — Encounter: Payer: Self-pay | Admitting: Gastroenterology

## 2012-05-14 NOTE — Telephone Encounter (Signed)
If constipation is not adequately treated add Miralax qd or bid. She is already on Linzess or Amitiza I think. Gas X tid and low gas diet Abdominal bloating may be secondary to weight gain which cannot be helped without weight loss

## 2012-05-14 NOTE — Telephone Encounter (Signed)
Left message for patient to call back  

## 2012-05-14 NOTE — Telephone Encounter (Signed)
Patient states that she is not having constipation. She does still have bloating though. I advised patient to try Gas-X three times daily and a low gas diet (which I have mailed to her today). I have also advised weight loss to see if this helps with some of her symptoms. Patient will call back if no improvement in her symptoms after all of these measures have been taken.

## 2012-05-14 NOTE — Progress Notes (Signed)
   [facility] Follow-up Outpatient Visit  STEPH CHEADLE 01-May-1970  Date: 05/12/2012   Subjective: Sarah Barry presents today to followup on her treatment for bipolar disorder. She reports that over the past 3-4 weeks she has been experiencing increased depression. At her last visit in mid January, we decreased her Abilify to 2.5 mg daily due to some reported akathisia. By telephone, her Abilify was increased back to 2.5 mg twice daily a few days ago. She endorses a lack of motivation, poor energy, and poor sleep. She also reports an increase in stressors recently, including her car breaking down, the stress of school, and financial difficulties. She reports that she has been attending approximately 3 meetings per week, but she no longer has a sponsor. She has not been keeping track, but thinks she has about 9 months of clean time. She continues to exercise 4-5 times weekly, and has been eating a healthier diet. In the past when she took trazodone, it made her groggy the next day. She denies any suicidal or homicidal ideation. She denies any auditory or visual hallucinations.  There were no vitals filed for this visit.  Mental Status Examination  Appearance: casual Alert: Yes Attention: good  Cooperative: Yes Eye Contact: Good Speech: clear and coherent Psychomotor Activity: Normal Memory/Concentration: intact Oriented: person, place, time/date and situation Mood: Dysphoric Affect: Congruent Thought Processes and Associations: Linear Fund of Knowledge: Good Thought Content: normal Insight: Good Judgement: Fair  Diagnosis: bipolar disorder, mixed, most recent episode depressed, moderate  Treatment Plan: We will give the increased dose of Abilify more time to do, effective. We will watch for the return of akathisia. We will resume trazodone at a lower dose, 25 mg at bedtime to improve sleep. She will return for followup at my next available appointment in approximately 2 months,  but she will be put on a list to return earlier if there is a cancellation.  Evanell Redlich, PA-C

## 2012-05-14 NOTE — Telephone Encounter (Signed)
From Fonnie Mu  To Meryl Dare, MD [[id number]]  Composed 05/14/2012 8:33 AM For Delivery On 05/14/2012 8:33 AM  Subject Non-Urgent Medical Question Message Type Patient Medical Advice  Request Read Status Y  Message Body   I wanted to know what the next step is after the colonoscopy. I am still having severe bloating when I eat.   Thank you

## 2012-05-29 ENCOUNTER — Encounter: Payer: Self-pay | Admitting: Family Medicine

## 2012-05-29 ENCOUNTER — Telehealth: Payer: Self-pay | Admitting: Internal Medicine

## 2012-05-29 NOTE — Telephone Encounter (Signed)
She didn't mean to send a mychart email to [place] for this refill

## 2012-05-29 NOTE — Telephone Encounter (Signed)
Sent back to pt asking question wanting on pt to reply

## 2012-05-29 NOTE — Telephone Encounter (Signed)
Can you please call in a prescription of acyclovir 200 MG capsule to [facility] on [address]. If there is a medicine that is stronger that would be better as I have a severe breakout.

## 2012-05-29 NOTE — Telephone Encounter (Signed)
I see acyclovir listed on her med list (?added in Feb, not related to any visit with me, never rx'd by [place]), but I don't have any diagnosis in her history or problem list.  We have never discussed this, and I don't not know what kind of "outbreak" she is referring to.  There are different treatment recommendations depending on type of outbreak--genital? Cold sore on her lip?  How long has she had it--limited benefit to using if she has had sore for over 2-3 days already

## 2012-06-01 ENCOUNTER — Other Ambulatory Visit (HOSPITAL_COMMUNITY): Payer: Self-pay | Admitting: Physician Assistant

## 2012-06-16 ENCOUNTER — Telehealth (HOSPITAL_COMMUNITY): Payer: Self-pay | Admitting: *Deleted

## 2012-06-16 NOTE — Telephone Encounter (Signed)
Patient contacted office to ask for clarification of Wellbutrin dose. Contacted pt [phone] @ 1216 to give following information: Per chart, Wellbutrin XL is 150 mg, 2 per day.  Call went to VM. Left message for pt to contact office as name on MB not identified.

## 2012-06-16 NOTE — Telephone Encounter (Signed)
Called pt in response to earlier question regarding dose of Wellbutrin. Per provider-Dose of medicine is Well butrin XL 300 mg daily - that is TWO of the 150 mg tablets.Call office for further questions

## 2012-06-23 ENCOUNTER — Encounter: Payer: Self-pay | Admitting: Gastroenterology

## 2012-06-23 ENCOUNTER — Other Ambulatory Visit: Payer: Self-pay

## 2012-06-23 MED ORDER — ESOMEPRAZOLE MAGNESIUM 40 MG PO CPDR: 40.0000 mg | DELAYED_RELEASE_CAPSULE | Freq: Two times a day (BID) | ORAL | Status: DC

## 2012-07-07 ENCOUNTER — Ambulatory Visit (HOSPITAL_COMMUNITY): Payer: Self-pay | Admitting: Physician Assistant

## 2012-07-14 ENCOUNTER — Other Ambulatory Visit (HOSPITAL_COMMUNITY): Payer: Self-pay | Admitting: Physician Assistant

## 2012-07-17 ENCOUNTER — Telehealth (HOSPITAL_COMMUNITY): Payer: Self-pay

## 2012-07-17 ENCOUNTER — Other Ambulatory Visit (HOSPITAL_COMMUNITY): Payer: Self-pay | Admitting: Physician Assistant

## 2012-07-17 DIAGNOSIS — F3132 Bipolar disorder, current episode depressed, moderate: Secondary | ICD-10-CM

## 2012-07-17 MED ORDER — ARIPIPRAZOLE 5 MG PO TABS: 2.5000 mg | ORAL_TABLET | Freq: Two times a day (BID) | ORAL | Status: DC

## 2012-07-17 MED ORDER — CITALOPRAM HYDROBROMIDE 40 MG PO TABS: 40.0000 mg | ORAL_TABLET | Freq: Every day | ORAL | Status: DC

## 2012-08-04 ENCOUNTER — Ambulatory Visit (INDEPENDENT_AMBULATORY_CARE_PROVIDER_SITE_OTHER): Payer: 59 | Admitting: Physician Assistant

## 2012-08-04 VITALS — BP 113/85 | HR 98 | Ht 60.0 in | Wt 157.0 lb

## 2012-08-04 DIAGNOSIS — F3130 Bipolar disorder, current episode depressed, mild or moderate severity, unspecified: Secondary | ICD-10-CM

## 2012-08-04 DIAGNOSIS — F3160 Bipolar disorder, current episode mixed, unspecified: Secondary | ICD-10-CM

## 2012-08-06 ENCOUNTER — Encounter (HOSPITAL_COMMUNITY): Payer: Self-pay | Admitting: Physician Assistant

## 2012-08-06 NOTE — Progress Notes (Addendum)
[facility] [id number] Progress Note  Sarah Barry [id number] 42 y.o.  08/04/2012 4:07 PM  Chief Complaint: Followup visit for bipolar disorder and substance abuse  History of Present Illness:  Sarah Barry presents today to followup on her treatment for bipolar disorder. She reports that she has been off of her Abilify and Celexa for 2-3 weeks now because she was having trouble getting refills authorized. She describes some serotonin withdrawal. She states that her mood has been bad, and endorses being depressed. She denies any suicidal or homicidal ideation. She denies any auditory or visual hallucinations. She states that she is sleeping well, and her appetite is good. She has lost 7 pounds. She states that she is working out more. She has been employed at [employer] for over one year now. 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 She has gotten a new sponsor, and now that she is out of school she can return to attending meetings on a daily basis.  Suicidal Ideation: No Plan Formed: No Patient has means to carry out plan: No  Homicidal Ideation: No Plan Formed: No Patient has means to carry out plan: No  Review of Systems: Psychiatric: Agitation: No Hallucination: No Depressed Mood: Yes Insomnia: No Hypersomnia: No Altered Concentration: No Feels Worthless: Yes Grandiose Ideas: No Belief In Special Powers: No New/Increased Substance Abuse: No Compulsions: No  Neurologic: Headache: No Seizure: No Paresthesias: No  Past Medical History: GERD, iron deficiency anemia, back pain, Allergic rhinitis, Folliculitis, Chronic headache, Thrombocytosis, Positive H. pylori test     Outpatient Encounter Prescriptions as of 08/04/2012  Medication Sig Dispense Refill  . acyclovir (ZOVIRAX) 200 MG capsule Take 200 mg by mouth every 4 (four) hours while awake.       Marland Kitchen AMITIZA 24 MCG capsule TAKE ONE CAPSULE BY MOUTH TWICE DAILY WITH MEALS  60  capsule  3  . ARIPiprazole (ABILIFY) 5 MG tablet Take 0.5 tablets (2.5 mg total) by mouth 2 (two) times daily.  30 tablet  5  . buPROPion (WELLBUTRIN XL) 150 MG 24 hr tablet TAKE TWO TABLETS BY MOUTH EVERY DAY  180 tablet  0  . citalopram (CELEXA) 40 MG tablet Take 1 tablet (40 mg total) by mouth at bedtime.  90 tablet  5  . doxycycline (MONODOX) 100 MG capsule Take 100 mg by mouth daily.       Marland Kitchen esomeprazole (NEXIUM) 40 MG capsule Take 1 capsule (40 mg total) by mouth 2 (two) times daily.  180 capsule  3  . ferrous sulfate 324 (65 FE) MG TBEC Take 1 tablet (325 mg total) by mouth 2 (two) times daily. With meals.  60 tablet  1  . Multiple Vitamins-Minerals (MULTIVITAMINS THER. W/MINERALS) TABS Take 1 tablet by mouth daily.        Marland Kitchen OVER THE COUNTER MEDICATION Vanish Fat Metabolizer-take 2 capsules bid      . OVER THE COUNTER MEDICATION Digestive enzymes- Take one with each meal      . OVER THE COUNTER MEDICATION Inflammotion-Take 2 pills daily      . OVER THE COUNTER MEDICATION 5-HTP-Tale one daily      . oxyCODONE-acetaminophen (PERCOCET/ROXICET) 5-325 MG per tablet Take 1 tablet by mouth as needed.       . [DISCONTINUED] traZODone (DESYREL) 50 MG tablet Take 0.5-1 tablets (25-50 mg total) by mouth at bedtime.  30 tablet  1   No facility-administered encounter medications on file as  of 08/04/2012.    Past Psychiatric History/Hospitalization(s): Anxiety: No Bipolar Disorder: Yes Depression: Yes Mania: Yes Psychosis: No Schizophrenia: No Personality Disorder: No Hospitalization for psychiatric illness: Yes History of Electroconvulsive Shock Therapy: No Prior Suicide Attempts: No  Physical Exam: Constitutional:  BP 113/85  Pulse 98  Ht 5' (1.524 m)  Wt 157 lb (71.215 kg)  BMI 30.66 kg/m2  General Appearance: alert, oriented, no acute distress, well nourished and casually dressed  Musculoskeletal: Strength & Muscle Tone: within normal limits Gait & Station: normal Patient  leans: N/A  Psychiatric: Speech (describe rate, volume, coherence, spontaneity, and abnormalities if any): Clear and coherent with a regular rate and rhythm and normal volume  Thought Process (describe rate, content, abstract reasoning, and computation): Within normal limits  Associations: Intact  Thoughts: normal  Mental Status: Orientation: oriented to person, place, time/date and situation Mood & Affect: depressed affect Attention Span & Concentration: Intact  Medical Decision Making (Choose Three): Review of Psycho-Social Stressors (1), Established Problem, Worsening (2) and Review of Medication Regimen & Side Effects (2)  Assessment: Axis I: Bipolar, depressed  Axis II: Deferred  Axis III: GERD, iron deficiency anemia, back pain, Allergic rhinitis, Folliculitis, Chronic headache, Thrombocytosis, Positive H. pylori test    Axis IV: Moderate  Axis V: 55   Plan: We will resume her Abilify 5 mg daily and Celexa 40 mg at bedtime. She will continue her Wellbutrin XL 150 mg daily. She will return for followup in 2 months. She is encouraged to call between appointments if there are concerns.  Zerrick Hanssen, PA-C 08/06/2012

## 2012-08-20 ENCOUNTER — Telehealth: Payer: Self-pay

## 2012-08-20 ENCOUNTER — Encounter: Payer: Self-pay | Admitting: Gastroenterology

## 2012-08-20 NOTE — Telephone Encounter (Signed)
Pt called requesting that acyclovir be called in for her. Left message for pt to call back.

## 2012-08-20 NOTE — Telephone Encounter (Signed)
Left message for pt to call back  °

## 2012-08-24 ENCOUNTER — Other Ambulatory Visit (HOSPITAL_COMMUNITY): Payer: Self-pay | Admitting: *Deleted

## 2012-08-24 ENCOUNTER — Other Ambulatory Visit (HOSPITAL_COMMUNITY): Payer: Self-pay | Admitting: Physician Assistant

## 2012-08-24 DIAGNOSIS — F319 Bipolar disorder, unspecified: Secondary | ICD-10-CM

## 2012-08-24 DIAGNOSIS — F3160 Bipolar disorder, current episode mixed, unspecified: Secondary | ICD-10-CM

## 2012-08-24 MED ORDER — BUPROPION HCL ER (XL) 300 MG PO TB24: 300.0000 mg | ORAL_TABLET | ORAL | Status: DC

## 2012-08-24 NOTE — Telephone Encounter (Signed)
Call patient to confirm dose of Wellbutrin. Patient reports she is taking Wellbutrin XL 300 mg daily, and is doing well. Will refill prescription for 90 days at Wellbutrin XL 300 mg daily

## 2012-08-24 NOTE — Telephone Encounter (Signed)
I have left the patient a message she will need to obtain from her primary care MD

## 2012-10-12 ENCOUNTER — Ambulatory Visit (HOSPITAL_COMMUNITY): Payer: Self-pay | Admitting: Physician Assistant

## 2012-12-18 ENCOUNTER — Other Ambulatory Visit (HOSPITAL_COMMUNITY): Payer: Self-pay | Admitting: Physician Assistant

## 2012-12-18 NOTE — Telephone Encounter (Signed)
Received request from [facility] pharmacy to authorize refills of bupropion 300 mg XL tablets. Patient has not been seen since 08/04/2012, and missed an appointment on 10/12/2012. She has no appointment scheduled. This provider called and left a message for the patient and informed patient that this provider will no longer be working in this clinic. Instructed patient to contact the clinic to make an appointment with another provider and clarify need for medication refills.

## 2012-12-21 ENCOUNTER — Telehealth (HOSPITAL_COMMUNITY): Payer: Self-pay | Admitting: *Deleted

## 2012-12-21 DIAGNOSIS — F319 Bipolar disorder, unspecified: Secondary | ICD-10-CM

## 2012-12-21 MED ORDER — BUPROPION HCL ER (XL) 300 MG PO TB24: 300.0000 mg | ORAL_TABLET | ORAL | Status: DC

## 2012-12-21 NOTE — Telephone Encounter (Signed)
Advised pt that provider has left practice. Left note for this writer that he "Left message for pt to make an appt with another provider, here or elsewhere and and clarify need for refills." 12/18/12 0930 Pt states she talked to  A.Watt, PA on Friday and all he wanted to know was if she was on Wellbutrin 300 mg or 150 mg, and that he was going to send in prescription to pharmacy. Patient stated she had 5 refills left on her other medications. Questioned patient about that information as prescriptions were written in May 2015, with 5 refills, and wondered why this was a problem. Advised pt that she needs to see provider on regular basis to evaluate effectiveness of medications. She asks names of available providers in order to check with her insurance company. Provider names given.  Advised pt that last appt this office in June 2014 with no further appt scheduled, that she will receive a 30 day supply of medication at this time and receive further refills at next appt. Pt verbalized understanding.

## 2012-12-24 ENCOUNTER — Other Ambulatory Visit: Payer: Self-pay

## 2013-02-16 ENCOUNTER — Encounter: Payer: Self-pay | Admitting: Certified Nurse Midwife

## 2013-02-19 ENCOUNTER — Ambulatory Visit (HOSPITAL_COMMUNITY): Payer: Self-pay | Admitting: Psychiatry

## 2013-02-24 ENCOUNTER — Encounter (HOSPITAL_COMMUNITY): Payer: Self-pay | Admitting: Psychiatry

## 2013-02-24 ENCOUNTER — Ambulatory Visit (INDEPENDENT_AMBULATORY_CARE_PROVIDER_SITE_OTHER): Payer: 59 | Admitting: Psychiatry

## 2013-02-24 VITALS — BP 113/76 | HR 75 | Ht 60.0 in | Wt 159.0 lb

## 2013-02-24 DIAGNOSIS — F3130 Bipolar disorder, current episode depressed, mild or moderate severity, unspecified: Secondary | ICD-10-CM

## 2013-02-24 DIAGNOSIS — F319 Bipolar disorder, unspecified: Secondary | ICD-10-CM

## 2013-02-24 DIAGNOSIS — F3132 Bipolar disorder, current episode depressed, moderate: Secondary | ICD-10-CM

## 2013-02-24 DIAGNOSIS — Z79899 Other long term (current) drug therapy: Secondary | ICD-10-CM

## 2013-02-24 MED ORDER — CITALOPRAM HYDROBROMIDE 40 MG PO TABS: 40.0000 mg | ORAL_TABLET | Freq: Every day | ORAL | Status: DC

## 2013-02-24 MED ORDER — ARIPIPRAZOLE 5 MG PO TABS: 2.5000 mg | ORAL_TABLET | Freq: Two times a day (BID) | ORAL | Status: DC

## 2013-02-24 MED ORDER — BUPROPION HCL ER (XL) 300 MG PO TB24: 300.0000 mg | ORAL_TABLET | ORAL | Status: DC

## 2013-02-24 NOTE — Progress Notes (Addendum)
[facility] [phone] Progress Note  Sarah Barry [id number] 43 y.o.  Chief Complaint:  I need my medication.  I have bipolar disorder and history of substance abuse  History of Present Illness:   Sarah Barry is 43 year old Caucasian employed unmarried female who has been seen in this office by physician assistant came for her appointment.  She has a history of bipolar disorder and substance use.  On her last visit she was recommended to restart Abilify which he stopped taking it.  Patient is taking Abilify along with Celexa and Wellbutrin.  She sleeping better.  She denies any irritability or any anger.  She had a good Christmas.  She visited her mother father and grandmother.  Patient lives alone.  She has never matted .  She has no children.  She is working as a Quarry manager at [employer].  She likes her job.  She claims to be sober from drinking since September 2014.  The patient also not using any illegal substance.  Patient denies any irritability, paranoia, anger or any crying spells.  She denies any hallucination or any paranoia.  She goes to [employer] on a regular basis.  She has no tremors or shakes.  Her appetite and bottles are stable.    Suicidal Ideation: No Plan Formed: No Patient has means to carry out plan: No  Homicidal Ideation: No Plan Formed: No Patient has means to carry out plan: No  Review of Systems: Psychiatric: Agitation: No Hallucination: No Depressed Mood: No Insomnia: No Hypersomnia: No Altered Concentration: No Feels Worthless: Yes Grandiose Ideas: No Belief In Special Powers: No New/Increased Substance Abuse: No Compulsions: No  Neurologic: Headache: No Seizure: No Paresthesias: No  Past Medical History:  Patient has history of GERD, iron deficiency anemia, back pain, Allergic rhinitis, Folliculitis, Chronic headache, Thrombocytosis and Positive H. pylori test.     Outpatient Encounter Prescriptions as of 02/24/2013  Medication Sig   . acyclovir (ZOVIRAX) 200 MG capsule Take 200 mg by mouth every 4 (four) hours while awake.   Marland Kitchen ampicillin (PRINCIPEN) 500 MG capsule Take 500 mg by mouth 2 (two) times daily.  . ARIPiprazole (ABILIFY) 5 MG tablet Take 0.5 tablets (2.5 mg total) by mouth 2 (two) times daily.  Marland Kitchen buPROPion (WELLBUTRIN XL) 300 MG 24 hr tablet Take 1 tablet (300 mg total) by mouth every morning.  . citalopram (CELEXA) 40 MG tablet Take 1 tablet (40 mg total) by mouth at bedtime.  Marland Kitchen esomeprazole (NEXIUM) 40 MG capsule Take 1 capsule (40 mg total) by mouth 2 (two) times daily.  . ferrous sulfate 324 (65 FE) MG TBEC Take 1 tablet (325 mg total) by mouth 2 (two) times daily. With meals.  . Multiple Vitamins-Minerals (MULTIVITAMINS THER. W/MINERALS) TABS Take 1 tablet by mouth daily.    Marland Kitchen oxyCODONE-acetaminophen (PERCOCET/ROXICET) 5-325 MG per tablet Take 1 tablet by mouth as needed.   . [DISCONTINUED] ARIPiprazole (ABILIFY) 5 MG tablet Take 0.5 tablets (2.5 mg total) by mouth 2 (two) times daily.  . [DISCONTINUED] buPROPion (WELLBUTRIN XL) 300 MG 24 hr tablet Take 1 tablet (300 mg total) by mouth every morning.  . [DISCONTINUED] citalopram (CELEXA) 40 MG tablet Take 1 tablet (40 mg total) by mouth at bedtime.  Marland Kitchen OVER THE COUNTER MEDICATION 5-HTP-Tale one daily  . [DISCONTINUED] AMITIZA 24 MCG capsule TAKE ONE CAPSULE BY MOUTH TWICE DAILY WITH MEALS  . [DISCONTINUED] doxycycline (MONODOX) 100 MG capsule Take 100 mg by mouth daily.   . [DISCONTINUED] OVER  THE COUNTER MEDICATION Vanish Fat Metabolizer-take 2 capsules bid  . [DISCONTINUED] OVER THE COUNTER MEDICATION Digestive enzymes- Take one with each meal  . [DISCONTINUED] OVER THE COUNTER MEDICATION Inflammotion-Take 2 pills daily    Past Psychiatric History/Hospitalization(s): Patient has significant history of alcohol and drug use.  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  She attended [facility] in the past .  She had tried Zoloft, Seroquel , Effexor and  Lexapro in the past.  Patient has history of mania and depression .   Anxiety: No Bipolar Disorder: Yes Depression: Yes Mania: Yes Psychosis: No Schizophrenia: No Personality Disorder: No Hospitalization for psychiatric illness: Yes History of Electroconvulsive Shock Therapy: No Prior Suicide Attempts: No  Physical Exam: Constitutional:  BP 113/76  Pulse 75  Ht 5' (1.524 m)  Wt 159 lb (72.122 kg)  BMI 31.05 kg/m2  General Appearance: alert, oriented, no acute distress, well nourished and casually dressed  Musculoskeletal: Strength & Muscle Tone: within normal limits Gait & Station: normal Patient leans: N/A  Psychiatric: Speech (describe rate, volume, coherence, spontaneity, and abnormalities if any): Clear and coherent with a regular rate and rhythm and normal volume  Thought Process (describe rate, content, abstract reasoning, and computation): Within normal limits  Associations: Intact  Thoughts: normal  Mental Status: Orientation: oriented to person, place, time/date and situation Mood & Affect: depressed affect Attention Span & Concentration: Intact  Medical Decision Making (Choose Three): Established Problem, Stable/Improving (1), Review of Psycho-Social Stressors (1), Review or order clinical lab tests (1), Decision to obtain old records (1), Review and summation of old records (2), Established Problem, Worsening (2), Review of Last Therapy Session (1) and Review of Medication Regimen & Side Effects (2)  Assessment: Axis I: Bipolar, depressed  Axis II: Deferred  Axis III: GERD, iron deficiency anemia, back pain, Allergic rhinitis, Folliculitis, Chronic headache, Thrombocytosis, Positive H. pylori test    Axis IV: Moderate  Axis V: 55   Plan:  I reviewed her records, history, psychosocial stressors in her current medication.  Patient is doing better on her Abilify 5 mg daily, Wellbutrin XL 150 mg daily  and Celexa 40 mg at bedtime.  she has no tremors or  shakes.  She claims to be sober for drinking since September 2014.  Patient has no blood work in a while.  I will order CBC, CMP, hemoglobin A1c, TSH and lipid panel.  Recommend to call us back if she has any question or any concern.  Followup in 2 months. Time spent 25 minutes.  More than 50% of the time spent in psychoeducation, counseling and coordination of care.  Discuss safety plan that anytime having active suicidal thoughts or homicidal thoughts then patient need to call 911 or go to the local emergency room.  Latonda Larrivee T., MD 02/24/2013

## 2013-03-08 ENCOUNTER — Telehealth (HOSPITAL_COMMUNITY): Payer: Self-pay | Admitting: *Deleted

## 2013-03-08 NOTE — Telephone Encounter (Signed)
Pt called stating her Abilify could not be filled at her pharmacy until 03/19/13 and asking why? [place] and was told that script was received 02/24/13 but insurance would not fill due to message, "too early." It appears that she may have filled it somewhere else per [facility] pharmacist.

## 2013-03-08 NOTE — Telephone Encounter (Signed)
Called patient explained reason pharmacy was denying refill of Abilify stating "too early." Advised patient to call her insurance company to ask the reason as she states she has not refilled medication at any other pharmacy. Pt states she will call them right away to get clarification. Thanked Probation officer for calling her back.

## 2013-03-16 ENCOUNTER — Encounter: Payer: Self-pay | Admitting: Certified Nurse Midwife

## 2013-04-20 ENCOUNTER — Encounter: Payer: Self-pay | Admitting: Certified Nurse Midwife

## 2013-04-26 ENCOUNTER — Ambulatory Visit (HOSPITAL_COMMUNITY): Payer: Self-pay | Admitting: Psychiatry

## 2013-04-30 LAB — CBC WITH DIFFERENTIAL/PLATELET
BASOS PCT: 0 % (ref 0–1)
Basophils Absolute: 0 10*3/uL (ref 0.0–0.1)
Eosinophils Absolute: 0.2 10*3/uL (ref 0.0–0.7)
Eosinophils Relative: 2 % (ref 0–5)
HEMATOCRIT: 32.8 % — AB (ref 36.0–46.0)
Hemoglobin: 10.5 g/dL — ABNORMAL LOW (ref 12.0–15.0)
LYMPHS ABS: 2.7 10*3/uL (ref 0.7–4.0)
Lymphocytes Relative: 37 % (ref 12–46)
MCH: 25.2 pg — AB (ref 26.0–34.0)
MCHC: 32 g/dL (ref 30.0–36.0)
MCV: 78.8 fL (ref 78.0–100.0)
Monocytes Absolute: 0.6 10*3/uL (ref 0.1–1.0)
Monocytes Relative: 8 % (ref 3–12)
NEUTROS PCT: 53 % (ref 43–77)
Neutro Abs: 3.9 10*3/uL (ref 1.7–7.7)
Platelets: 490 10*3/uL — ABNORMAL HIGH (ref 150–400)
RBC: 4.16 MIL/uL (ref 3.87–5.11)
RDW: 15.6 % — ABNORMAL HIGH (ref 11.5–15.5)
WBC: 7.4 10*3/uL (ref 4.0–10.5)

## 2013-04-30 LAB — LIPID PANEL
CHOLESTEROL: 166 mg/dL (ref 0–200)
HDL: 38 mg/dL — AB (ref >39)
LDL Cholesterol: 93 mg/dL (ref 0–99)
Total CHOL/HDL Ratio: 4.4 Ratio
Triglycerides: 176 mg/dL — ABNORMAL HIGH (ref <150)
VLDL: 35 mg/dL (ref 0–40)

## 2013-04-30 LAB — COMPREHENSIVE METABOLIC PANEL
ALT: 15 U/L (ref 0–35)
AST: 12 U/L (ref 0–37)
Albumin: 4 g/dL (ref 3.5–5.2)
Alkaline Phosphatase: 67 U/L (ref 39–117)
BILIRUBIN TOTAL: 0.2 mg/dL (ref 0.2–1.2)
BUN: 12 mg/dL (ref 6–23)
CO2: 29 meq/L (ref 19–32)
Calcium: 9 mg/dL (ref 8.4–10.5)
Chloride: 101 mEq/L (ref 96–112)
Creat: 0.76 mg/dL (ref 0.50–1.10)
GLUCOSE: 97 mg/dL (ref 70–99)
Potassium: 4.2 mEq/L (ref 3.5–5.3)
SODIUM: 135 meq/L (ref 135–145)
TOTAL PROTEIN: 6.2 g/dL (ref 6.0–8.3)

## 2013-04-30 LAB — HEMOGLOBIN A1C
HEMOGLOBIN A1C: 5.6 % (ref <5.7)
MEAN PLASMA GLUCOSE: 114 mg/dL (ref <117)

## 2013-05-01 LAB — TSH: TSH: 1.484 u[IU]/mL (ref 0.350–4.500)

## 2013-05-03 ENCOUNTER — Encounter (HOSPITAL_COMMUNITY): Payer: Self-pay | Admitting: Psychiatry

## 2013-05-03 ENCOUNTER — Ambulatory Visit (INDEPENDENT_AMBULATORY_CARE_PROVIDER_SITE_OTHER): Payer: 59 | Admitting: Psychiatry

## 2013-05-03 ENCOUNTER — Encounter: Payer: Self-pay | Admitting: Family Medicine

## 2013-05-03 VITALS — BP 110/60 | HR 88 | Ht 60.0 in | Wt 156.0 lb

## 2013-05-03 DIAGNOSIS — F3130 Bipolar disorder, current episode depressed, mild or moderate severity, unspecified: Secondary | ICD-10-CM

## 2013-05-03 DIAGNOSIS — F319 Bipolar disorder, unspecified: Secondary | ICD-10-CM

## 2013-05-03 MED ORDER — ARIPIPRAZOLE 5 MG PO TABS: 2.5000 mg | ORAL_TABLET | Freq: Two times a day (BID) | ORAL | Status: DC

## 2013-05-03 MED ORDER — BUPROPION HCL ER (XL) 300 MG PO TB24
300.0000 mg | ORAL_TABLET | ORAL | Status: DC
Start: 2013-05-03 — End: 2013-08-03

## 2013-05-03 MED ORDER — CITALOPRAM HYDROBROMIDE 40 MG PO TABS: 40.0000 mg | ORAL_TABLET | Freq: Every day | ORAL | Status: DC

## 2013-05-03 NOTE — Progress Notes (Signed)
[facility] [phone] Progress Note  Sarah Barry [id number] 43 y.o.  Chief Complaint:  I did not refill Abilify but I think I need to that medication.      History of Present Illness:   Sarah Barry came for her followup appointment.  She has not refilled her Abilify because of cost and insurance reasons but realized that she is feeling more irritable sad and stressed out.  She wants to go back on Abilify.  When she was taking Abilify her mood was good.  Lately she has noticed stressed from her boyfriend.  The patient is compliant with Celexa and Wellbutrin.  She denies any side effects of medication.  She has blood work which shows anemia.  She admitted not taking her iron pills on a regular basis.  She continues to work at [employer] and she likes her job.  Patient lives alone but she has a boyfriend who usually stays with her.  She has no children.  Patient denies any side effects of medication.  She denies any tremors or shakes.  She continues to go to [employer].  Her appetite and weight is unchanged from the past  Suicidal Ideation: No Plan Formed: No Patient has means to carry out plan: No  Homicidal Ideation: No Plan Formed: No Patient has means to carry out plan: No  Review of Systems: Psychiatric: Agitation: No Hallucination: No Depressed Mood: No Insomnia: No Hypersomnia: No Altered Concentration: No Feels Worthless: Yes Grandiose Ideas: No Belief In Special Powers: No New/Increased Substance Abuse: No Compulsions: No  Neurologic: Headache: No Seizure: No Paresthesias: No  Past Medical History:  Patient has history of GERD, iron deficiency anemia, back pain, Allergic rhinitis, Folliculitis, Chronic headache, Thrombocytosis and Positive H. pylori test.     Outpatient Encounter Prescriptions as of 05/03/2013  Medication Sig  . acyclovir (ZOVIRAX) 200 MG capsule Take 200 mg by mouth every 4 (four) hours while awake.   Marland Kitchen ampicillin (PRINCIPEN)  500 MG capsule Take 500 mg by mouth 2 (two) times daily.  . citalopram (CELEXA) 40 MG tablet Take 1 tablet (40 mg total) by mouth at bedtime.  Marland Kitchen esomeprazole (NEXIUM) 40 MG capsule Take 1 capsule (40 mg total) by mouth 2 (two) times daily.  . Multiple Vitamins-Minerals (MULTIVITAMINS THER. W/MINERALS) TABS Take 1 tablet by mouth daily.    . [DISCONTINUED] citalopram (CELEXA) 40 MG tablet Take 1 tablet (40 mg total) by mouth at bedtime.  . ARIPiprazole (ABILIFY) 5 MG tablet Take 0.5 tablets (2.5 mg total) by mouth 2 (two) times daily.  Marland Kitchen buPROPion (WELLBUTRIN XL) 300 MG 24 hr tablet Take 1 tablet (300 mg total) by mouth every morning.  . ferrous sulfate 324 (65 FE) MG TBEC Take 1 tablet (325 mg total) by mouth 2 (two) times daily. With meals.  . [DISCONTINUED] ARIPiprazole (ABILIFY) 5 MG tablet Take 0.5 tablets (2.5 mg total) by mouth 2 (two) times daily.  . [DISCONTINUED] buPROPion (WELLBUTRIN XL) 300 MG 24 hr tablet Take 1 tablet (300 mg total) by mouth every morning.  . [DISCONTINUED] OVER THE COUNTER MEDICATION 5-HTP-Tale one daily  . [DISCONTINUED] oxyCODONE-acetaminophen (PERCOCET/ROXICET) 5-325 MG per tablet Take 1 tablet by mouth as needed.     Past Psychiatric History/Hospitalization(s): Patient has significant history of alcohol and drug use.  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  She attended [facility] in [facility] past .  She had tried Zoloft, Seroquel , Effexor and Lexapro in the past.  Patient has history of mania and depression .   Anxiety: No Bipolar Disorder: Yes Depression: Yes Mania: Yes Psychosis: No Schizophrenia: No Personality Disorder: No Hospitalization for psychiatric illness: Yes History of Electroconvulsive Shock Therapy: No Prior Suicide Attempts: No  Physical Exam: Constitutional:  BP 110/60  Pulse 88  Ht 5' (1.524 m)  Wt 156 lb (70.761 kg)  BMI 30.47 kg/m2  Recent Results (from the past 2160 hour(s))  CBC WITH DIFFERENTIAL     Status: Abnormal    Collection Time    04/30/13  2:37 PM      Result Value Ref Range   WBC 7.4  4.0 - 10.5 K/uL   RBC 4.16  3.87 - 5.11 MIL/uL   Hemoglobin 10.5 (*) 12.0 - 15.0 g/dL   HCT 32.8 (*) 36.0 - 46.0 %   MCV 78.8  78.0 - 100.0 fL   MCH 25.2 (*) 26.0 - 34.0 pg   MCHC 32.0  30.0 - 36.0 g/dL   RDW 15.6 (*) 11.5 - 15.5 %   Platelets 490 (*) 150 - 400 K/uL   Neutrophils Relative % 53  43 - 77 %   Neutro Abs 3.9  1.7 - 7.7 K/uL   Lymphocytes Relative 37  12 - 46 %   Lymphs Abs 2.7  0.7 - 4.0 K/uL   Monocytes Relative 8  3 - 12 %   Monocytes Absolute 0.6  0.1 - 1.0 K/uL   Eosinophils Relative 2  0 - 5 %   Eosinophils Absolute 0.2  0.0 - 0.7 K/uL   Basophils Relative 0  0 - 1 %   Basophils Absolute 0.0  0.0 - 0.1 K/uL   Smear Review Criteria for review not met    COMPREHENSIVE METABOLIC PANEL     Status: None   Collection Time    04/30/13  2:37 PM      Result Value Ref Range   Sodium 135  135 - 145 mEq/L   Potassium 4.2  3.5 - 5.3 mEq/L   Chloride 101  96 - 112 mEq/L   CO2 29  19 - 32 mEq/L   Glucose, Bld 97  70 - 99 mg/dL   BUN 12  6 - 23 mg/dL   Creat 0.76  0.50 - 1.10 mg/dL   Total Bilirubin 0.2  0.2 - 1.2 mg/dL   Alkaline Phosphatase 67  39 - 117 U/L   AST 12  0 - 37 U/L   ALT 15  0 - 35 U/L   Total Protein 6.2  6.0 - 8.3 g/dL   Albumin 4.0  3.5 - 5.2 g/dL   Calcium 9.0  8.4 - 10.5 mg/dL  HEMOGLOBIN A1C     Status: None   Collection Time    04/30/13  2:37 PM      Result Value Ref Range   Hemoglobin A1C 5.6  <5.7 %   Comment:                                                                            According to the ADA Clinical Practice Recommendations for 2011, when     HbA1c is used as a screening test:             >=  6.5%   Diagnostic of Diabetes Mellitus                (if abnormal result is confirmed)           5.7-6.4%   Increased risk of developing Diabetes Mellitus           References:Diagnosis and Classification of Diabetes Mellitus,Diabetes     XTKW,4097,35(HGDJM  1):S62-S69 and Standards of Medical Care in             Diabetes - 2011,Diabetes Care,2011,34 (Suppl 1):S11-S61.         Mean Plasma Glucose 114  <117 mg/dL  LIPID PANEL     Status: Abnormal   Collection Time    04/30/13  2:37 PM      Result Value Ref Range   Cholesterol 166  0 - 200 mg/dL   Comment: ATP III Classification:           < 200        mg/dL        Desirable          200 - 239     mg/dL        Borderline High          >= 240        mg/dL        High         Triglycerides 176 (*) <150 mg/dL   HDL 38 (*) >39 mg/dL   Total CHOL/HDL Ratio 4.4     VLDL 35  0 - 40 mg/dL   LDL Cholesterol 93  0 - 99 mg/dL   Comment:       Total Cholesterol/HDL Ratio:CHD Risk                            Coronary Heart Disease Risk Table                                            Men       Women              1/2 Average Risk              3.4        3.3                  Average Risk              5.0        4.4               2X Average Risk              9.6        7.1               3X Average Risk             23.4       11.0     Use the calculated Patient Ratio above and the CHD Risk table      to determine the patient's CHD Risk.     ATP III Classification (LDL):           < 100        mg/dL         Optimal  100 - 129     mg/dL         Near or Above Optimal          130 - 159     mg/dL         Borderline High          160 - 189     mg/dL         High           > 190        mg/dL         Very High        TSH     Status: None   Collection Time    04/30/13  2:37 PM      Result Value Ref Range   TSH 1.484  0.350 - 4.500 uIU/mL    General Appearance: alert, oriented, no acute distress, well nourished and casually dressed  Musculoskeletal: Strength & Muscle Tone: within normal limits Gait & Station: normal Patient leans: N/A  Psychiatric: Speech (describe rate, volume, coherence, spontaneity, and abnormalities if any): Clear and coherent with a regular rate and rhythm and normal  volume  Thought Process (describe rate, content, abstract reasoning, and computation): Within normal limits  Associations: Intact  Thoughts: normal  Mental Status: Orientation: oriented to person, place, time/date and situation Mood & Affect: depressed affect Attention Span & Concentration: Intact  Established Problem, Stable/Improving (1), Review of Psycho-Social Stressors (1), Review or order clinical lab tests (1), Review of Last Therapy Session (1) and Review of Medication Regimen & Side Effects (2)  Assessment: Axis I: Bipolar, depressed  Axis II: Deferred  Axis III: GERD, iron deficiency anemia, back pain, Allergic rhinitis, Folliculitis, Chronic headache, Thrombocytosis, Positive H. pylori test    Axis IV: Moderate  Axis V: 55   Plan:  I reviewed the results with her.  I provided the results to her so she can take to her primary care physician.  Recommended to continue Wellbutrin and Celexa present dose.  Patient like to start Abilify 5 mg again.  Discussed risks and benefits of medication.  Followup in 3 months.  Recommended to call us back if she has any question or any concern.  Nuchem Grattan T., MD 05/03/2013

## 2013-05-19 ENCOUNTER — Encounter: Payer: Self-pay | Admitting: Certified Nurse Midwife

## 2013-05-19 ENCOUNTER — Ambulatory Visit (INDEPENDENT_AMBULATORY_CARE_PROVIDER_SITE_OTHER): Payer: 59 | Admitting: Certified Nurse Midwife

## 2013-05-19 VITALS — BP 130/80 | HR 82 | Resp 20 | Ht 60.0 in | Wt 157.0 lb

## 2013-05-19 DIAGNOSIS — N946 Dysmenorrhea, unspecified: Secondary | ICD-10-CM

## 2013-05-19 DIAGNOSIS — N852 Hypertrophy of uterus: Secondary | ICD-10-CM | POA: Insufficient documentation

## 2013-05-19 DIAGNOSIS — Z01419 Encounter for gynecological examination (general) (routine) without abnormal findings: Secondary | ICD-10-CM

## 2013-05-19 DIAGNOSIS — N920 Excessive and frequent menstruation with regular cycle: Secondary | ICD-10-CM

## 2013-05-19 DIAGNOSIS — Z0000 Encounter for general adult medical examination without abnormal findings: Secondary | ICD-10-CM

## 2013-05-19 HISTORY — DX: Dysmenorrhea, unspecified: N94.6

## 2013-05-19 NOTE — Progress Notes (Signed)
43 y.o. G0P0000 Single Caucasian Fe here to establish gyn care and for annual exam.  Periods heavy with severe cramping for past 8 months. Patient diagnosed with fibroid 5 months with visit with Gyn with PUS for heavy bleeding and severe cramps.Patient describes period as heavy and painful on first 3 days and total duration 6 days. Patient had two consultations with Dr. Radene Knee and still did not understand what the problem was. Patient was recommended hysterectomy. Patient was taking Vicodin and Oxycodone for pain relief with cramps. Patient took Tylenol before with no relief. Not sexually active now, no STD concerns or testing desired. Patient sees PCP for aex and labs and other medication management. Patient currently taking OTC Fe for anemia per PCP. Patient has some constipation with iron use. Patient considering her feelings of having a baby also. No partner at present. No other health issues.   Patient's last menstrual period was 04/24/2013.          Sexually active: yes  The current method of family planning is condoms most of the time.    Exercising: no  The patient does not participate in regular exercise at present. Smoker:  Yes smoke a pack/qd  Health Maintenance: Pap:  2014 Abnormal not sure of result no follow up recommended MMG:  2014 Normal Colonoscopy:  2014 BMD:   None  TDaP:  01/23/2012 Labs:    reports that she has been smoking Cigarettes.  She has a 6.5 pack-year smoking history. She has never used smokeless tobacco. She reports that she does not drink alcohol or use illicit drugs.  Past Medical History  Diagnosis Date  . GERD (gastroesophageal reflux disease)   . Allergic rhinitis   . Folliculitis   . Chronic headache     during cycle  . Thrombocytosis   . Bipolar disorder   . Positive H. pylori test   . Depression   . Fatigue   . Tongue disorder   . Anemia   . Cancer     skin ca - buttocks  . Colon polyp 04/2012    tubular adenoma  . Fibroid   . HSV infection      Past Surgical History  Procedure Laterality Date  . Cholecystectomy    . Skin cancer excision      buttock area, Dr. Marlou Starks  . Colonoscopy  04/2012    tubular adenoma    Current Outpatient Prescriptions  Medication Sig Dispense Refill  . acyclovir (ZOVIRAX) 200 MG capsule Take 200 mg by mouth every 4 (four) hours while awake.       Marland Kitchen ampicillin (PRINCIPEN) 500 MG capsule Take 500 mg by mouth 2 (two) times daily.      . ARIPiprazole (ABILIFY) 5 MG tablet Take 0.5 tablets (2.5 mg total) by mouth 2 (two) times daily.  30 tablet  2  . buPROPion (WELLBUTRIN XL) 300 MG 24 hr tablet Take 1 tablet (300 mg total) by mouth every morning.  30 tablet  2  . citalopram (CELEXA) 40 MG tablet Take 1 tablet (40 mg total) by mouth at bedtime.  30 tablet  2  . esomeprazole (NEXIUM) 40 MG capsule Take 1 capsule (40 mg total) by mouth 2 (two) times daily.  180 capsule  3  . ferrous sulfate 324 (65 FE) MG TBEC Take 1 tablet (325 mg total) by mouth 2 (two) times daily. With meals.  60 tablet  1  . Multiple Vitamins-Minerals (MULTIVITAMINS THER. W/MINERALS) TABS Take 1 tablet by mouth daily.        Marland Kitchen  HYPERCARE 20 % external solution       . [DISCONTINUED] QUEtiapine (SEROQUEL) 100 MG tablet Take 150 mg by mouth at bedtime.         No current facility-administered medications for this visit.    Family History  Problem Relation Age of Onset  . Colon cancer Maternal Grandfather   . Cancer Maternal Grandfather     colon cancer  . Diabetes Paternal Grandmother   . Alcohol abuse Father   . Physical abuse Father   . Heart disease Father 2    MI  . Hyperlipidemia Father   . Hypertension Father   . Esophageal cancer Neg Hx   . Rectal cancer Neg Hx   . Stomach cancer Neg Hx   . Alcohol abuse Mother   . Depression Mother   . Osteoporosis Maternal Grandmother     ROS:  Pertinent items are noted in HPI.  Otherwise, a comprehensive ROS was negative.  Exam:   BP 130/80  Pulse 82  Resp 20  Ht 5'  (1.524 m)  Wt 157 lb (71.215 kg)  BMI 30.66 kg/m2  LMP 04/24/2013 Height: 5' (152.4 cm)  Ht Readings from Last 3 Encounters:  05/19/13 5' (1.524 m)  05/03/13 5' (1.524 m)  02/24/13 5' (1.524 m)    General appearance: alert, cooperative and appears stated age Head: Normocephalic, without obvious abnormality, atraumatic Neck: no adenopathy, supple, symmetrical, trachea midline and thyroid normal to inspection and palpation and non-palpable Lungs: clear to auscultation bilaterally Breasts: normal appearance, no masses or tenderness, No nipple retraction or dimpling, No nipple discharge or bleeding, No axillary or supraclavicular adenopathy Heart: regular rate and rhythm Abdomen: soft, non-tender; no masses,  no organomegaly Extremities: extremities normal, atraumatic, no cyanosis or edema Skin: Skin color, texture, turgor normal. No rashes or lesions Lymph nodes: Cervical, supraclavicular, and axillary nodes normal. No abnormal inguinal nodes palpated Neurologic: Grossly normal   Pelvic: External genitalia:  no lesions              Urethra:  normal appearing urethra with no masses, tenderness or lesions              Bartholin's and Skene's: normal                 Vagina: normal appearing vagina with normal color and discharge, no lesions              Cervix: non tender, normal appearance              Pap taken: yes Bimanual Exam:  Uterus:  enlarged, 6-8 weeks size, firm, mobile and non tender              Adnexa: normal adnexa and no mass, fullness, tenderness               Rectovaginal: Confirms               Anus:  normal sphincter tone, no lesions  A:  Well Woman with normal exam  Contraception none, not sexually active, did not use with previous partner  Dysmenorrhea with menorrhagia  History of Uterine fibroid  History of abnormal pap smear ? etiology  Anemia on iron supplement, OTC per PCP  Bipolar with stable medication with Psychiatrist  Management  Smoker, not  interested in cessation  ?Infertility, not sure if she would like to conceive   P:   Reviewed health and wellness pertinent to exam  Uterine enlargement, history of fibroid per PUS,  plan to request records so further evaluation can be determined if needed. Patient agreeable.  Discussed dysmenorrhea/menorrhagia management option with IUD,Micronor, ablation risks and benefits, but would need PUS information for further  Recommendation.  Suggested patient try time release iron OTC with orange juice for better absorption and decrease constipation. Needs to be sure she has good fiber intake also.  Continue follow up as indicated with MD for medication management.  Discussed smoking risks to health and if considers infertility evaluation. Questions addressed.  Pap smear as per guidelines   Mammogram yearly, stressed scheduling, information given pap smear taken today with HPVHR, will advise of results and will assess other abnormal when records in.  counseled on breast self exam, STD prevention, HIV risk factors and prevention, adequate intake of calcium and vitamin D, diet and exercise  return annually or prn  An After Visit Summary was printed and given to the patient.

## 2013-05-19 NOTE — Patient Instructions (Signed)

## 2013-05-21 LAB — IPS PAP TEST WITH HPV

## 2013-05-21 NOTE — Progress Notes (Signed)
Reviewed personally.  M. Suzanne Delainy Mcelhiney, MD.  

## 2013-06-08 ENCOUNTER — Other Ambulatory Visit: Payer: Self-pay

## 2013-06-08 DIAGNOSIS — Z1231 Encounter for screening mammogram for malignant neoplasm of breast: Secondary | ICD-10-CM

## 2013-06-17 ENCOUNTER — Ambulatory Visit: Payer: Self-pay

## 2013-06-23 ENCOUNTER — Encounter: Payer: Self-pay | Admitting: Certified Nurse Midwife

## 2013-06-23 ENCOUNTER — Other Ambulatory Visit: Payer: Self-pay | Admitting: *Deleted

## 2013-06-23 MED ORDER — ACYCLOVIR 200 MG PO CAPS
200.0000 mg | ORAL_CAPSULE | ORAL | Status: DC
Start: 2013-06-23 — End: 2015-02-21

## 2013-06-23 NOTE — Telephone Encounter (Signed)
My Chart message received from patient requesting RX from our office prescribed by previous MD.   NGYN with Debbi on 05-19-13.  To Patty since Debbi out of office.

## 2013-07-01 ENCOUNTER — Encounter: Payer: Self-pay | Admitting: Certified Nurse Midwife

## 2013-07-20 ENCOUNTER — Institutional Professional Consult (permissible substitution): Payer: 59 | Admitting: Certified Nurse Midwife

## 2013-07-22 ENCOUNTER — Ambulatory Visit
Admission: RE | Admit: 2013-07-22 | Discharge: 2013-07-22 | Disposition: A | Discharge status: Home / Self Care | Payer: 59 | Source: Ambulatory Visit

## 2013-07-22 DIAGNOSIS — Z1231 Encounter for screening mammogram for malignant neoplasm of breast: Secondary | ICD-10-CM

## 2013-07-23 ENCOUNTER — Ambulatory Visit (INDEPENDENT_AMBULATORY_CARE_PROVIDER_SITE_OTHER): Payer: 59 | Admitting: Certified Nurse Midwife

## 2013-07-23 ENCOUNTER — Encounter: Payer: Self-pay | Admitting: Certified Nurse Midwife

## 2013-07-23 VITALS — BP 110/64 | HR 68 | Resp 16 | Ht 60.0 in | Wt 155.0 lb

## 2013-07-23 DIAGNOSIS — N840 Polyp of corpus uteri: Secondary | ICD-10-CM | POA: Insufficient documentation

## 2013-07-23 DIAGNOSIS — N920 Excessive and frequent menstruation with regular cycle: Secondary | ICD-10-CM

## 2013-07-23 DIAGNOSIS — N946 Dysmenorrhea, unspecified: Secondary | ICD-10-CM

## 2013-07-23 NOTE — Progress Notes (Signed)
43 y.o. Married Caucasian female G0P0000 here for follow up discussion of plan for management of dysmenorrhea with menorrhagia after review of previous records. Patient's last period was 5 days duration with dysmenorrhea. She has been taking Alieve and Tramadol for pain relief. No other health issues. 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 Skin tag removal only from vulva, benign.   O: Healthy WD,WN female Affect: Normal, orientation x 3    A: Dysmenorrhea with Menorrhagia Endometrial polyp noted in PUS report dated 04/16/12 and intramural 4 cm fibroid also noted, with Uterine measurement of 9.0 cm. Contraception none   P: Discussed findings of medical records and due to Polyp presence and fibroid, needs SHGM and possible endometrial biopsy, per consultation with Dr.Lathrop to evaluate for management of problem with period. Discussed Mirena IUD option risks and benefits, insertion and removal as a possibility. Ablation is also an option, but if suspected endometrosis this would not help with that presence. Discussed more definitive plan after evaluation. Patient would consider IUD placement for management. Questions addressed. Discussed insurance personnel will verify insurance and will advise of status and the [facility] will be scheduled. Discussed she will be given  Information regarding exam at that time with anticipated management plan for cycle control. Patient voiced understanding and appreciated the time spent to help her understand. Patient aware this will have to be done soon after period due no contraception.  Rv as above, prn.  30 minutes spent with patient  in face to face counseling.          Note reviewed, agree with plan.  Elveria Rising, MD

## 2013-07-29 ENCOUNTER — Telehealth: Payer: Self-pay | Admitting: Certified Nurse Midwife

## 2013-07-29 NOTE — Telephone Encounter (Signed)
Spoke with patient. Advised that per benefit quote received, she will be responsible for $923.60 when she comes in for [facility] and Endo BX. This is because she has a $3500 calendar year deductible that has not been met yet. Patient states that based on this info, she cannot proceed with the procedure at this time.

## 2013-07-30 ENCOUNTER — Telehealth: Payer: Self-pay | Admitting: Certified Nurse Midwife

## 2013-07-30 NOTE — Telephone Encounter (Signed)
Phone to call to patient regarding inability to have [facility] and Endometrial biopsy due not having met deductible. Discussed with patient the importance of evaluation of endometrial polyp in regards to her heavy bleeding and cramping with menses and management. Patient would like to schedule if payment plan arrangements could be made. Discussed with patient I will forward to our personnel who works with this and she will be contacted regarding. Patient very appreciative to call.  Addendum: Spoke with Dr. Sabra Heck who feels payment plan appropriate to offer to patient.

## 2013-07-31 NOTE — Progress Notes (Signed)
Reviewed personally.  M. Suzanne Dicy Smigel, MD.  

## 2013-08-02 ENCOUNTER — Telehealth: Payer: Self-pay | Admitting: Certified Nurse Midwife

## 2013-08-02 ENCOUNTER — Telehealth: Payer: Self-pay | Admitting: Emergency Medicine

## 2013-08-02 NOTE — Telephone Encounter (Signed)
Asked to speak with patient to schedule Sonohysterogram and possible endometrial bx appointment by Ivar Drape after discussing payment plan. Patient is agreeable to this.   Patient states she completed her LMP on 07/29/13. She is unsure what day she started. She will look at her records when she gets home (patient was driving) and call back. Advised that we have to schedule Sonohysterogram and possible endometrial bx for first 12 days of cycle. She is agreeable. She will call back when she has her records.   Routing to Regina Eck CNM.

## 2013-08-02 NOTE — Telephone Encounter (Signed)
I called the patient to offer a payment plan for SHG/ Endo bx. The patient was already aware of her OOP amount ($923.60). Initially, I offered her a 3 month plan. She told me that she would not be able to comfortably make those payments. Then I offered her 4 month option. She was agreeable to doing that plan. She will make monthly payments of $230.90 beginning the day of her appointment. I transferred the call to T Fast to schedule her appointment. I will also mail her a promissory agreement. (The patient is aware and agreeable to signing and returning).

## 2013-08-02 NOTE — Telephone Encounter (Signed)
agree

## 2013-08-02 NOTE — Telephone Encounter (Signed)
Sounds fine 

## 2013-08-03 ENCOUNTER — Ambulatory Visit (INDEPENDENT_AMBULATORY_CARE_PROVIDER_SITE_OTHER): Payer: 59 | Admitting: Psychiatry

## 2013-08-03 ENCOUNTER — Encounter (HOSPITAL_COMMUNITY): Payer: Self-pay | Admitting: Psychiatry

## 2013-08-03 VITALS — BP 103/64 | HR 90 | Ht 60.0 in | Wt 157.4 lb

## 2013-08-03 DIAGNOSIS — F3130 Bipolar disorder, current episode depressed, mild or moderate severity, unspecified: Secondary | ICD-10-CM

## 2013-08-03 DIAGNOSIS — F319 Bipolar disorder, unspecified: Secondary | ICD-10-CM

## 2013-08-03 MED ORDER — CITALOPRAM HYDROBROMIDE 40 MG PO TABS: 40.0000 mg | ORAL_TABLET | Freq: Every day | ORAL | Status: DC

## 2013-08-03 MED ORDER — ARIPIPRAZOLE 5 MG PO TABS: 5.0000 mg | ORAL_TABLET | Freq: Every day | ORAL | Status: DC

## 2013-08-03 MED ORDER — BUPROPION HCL ER (XL) 300 MG PO TB24: 300.0000 mg | ORAL_TABLET | ORAL | Status: DC

## 2013-08-03 NOTE — Telephone Encounter (Signed)
Will close encounter. Patient to call back on first day of menses.

## 2013-08-03 NOTE — Progress Notes (Signed)
Sarah Barry [phone] Progress Note  Sarah Barry [id number] 43 y.o.  Chief Complaint:  Medication management and followup.        History of Present Illness:   Sarah Barry came for her followup appointment.  She is taking Abilify 5 mg at bedtime.  She reported improvement in her sleep and she is less irritable and less angry.  She also endorsed increase exercising at least 3 times a week.  She has no tremors or shakes.  She is compliant with Celexa and Wellbutrin.  She denies any anger , irritability, mood swings or any paranoia.  She continues to work at [employer] and she likes her job.  Patient lives alone but she has a boyfriend who usually stays with her.  She has no children.  She continues to go to [employer].  Her appetite and weight is unchanged from the past  Suicidal Ideation: No Plan Formed: No Patient has means to carry out plan: No  Homicidal Ideation: No Plan Formed: No Patient has means to carry out plan: No  Review of Systems: Psychiatric: Agitation: No Hallucination: No Depressed Mood: No Insomnia: No Hypersomnia: No Altered Concentration: No Feels Worthless: Yes Grandiose Ideas: No Belief In Special Powers: No New/Increased Substance Abuse: No Compulsions: No  Neurologic: Headache: No Seizure: No Paresthesias: No  Past Medical History:  Patient has history of GERD, iron deficiency anemia, back pain, Allergic rhinitis, Folliculitis, Chronic headache, Thrombocytosis and Positive H. pylori test.     Outpatient Encounter Prescriptions as of 08/03/2013  Medication Sig  . acyclovir (ZOVIRAX) 200 MG capsule Take 1 capsule (200 mg total) by mouth every 4 (four) hours while awake.  Marland Kitchen ampicillin (PRINCIPEN) 500 MG capsule Take 500 mg by mouth daily.   . ARIPiprazole (ABILIFY) 5 MG tablet Take 1 tablet (5 mg total) by mouth daily.  Marland Kitchen buPROPion (WELLBUTRIN XL) 300 MG 24 hr tablet Take 1 tablet (300 mg total) by mouth every morning.  .  citalopram (CELEXA) 40 MG tablet Take 1 tablet (40 mg total) by mouth at bedtime.  Marland Kitchen esomeprazole (NEXIUM) 40 MG capsule Take 1 capsule (40 mg total) by mouth 2 (two) times daily.  . ferrous sulfate 324 (65 FE) MG TBEC Take 1 tablet (325 mg total) by mouth 2 (two) times daily. With meals.  Marland Kitchen HYPERCARE 20 % external solution   . Multiple Vitamins-Minerals (MULTIVITAMINS THER. W/MINERALS) TABS Take 1 tablet by mouth daily.    . [DISCONTINUED] ARIPiprazole (ABILIFY) 5 MG tablet Take 0.5 tablets (2.5 mg total) by mouth 2 (two) times daily.  . [DISCONTINUED] buPROPion (WELLBUTRIN XL) 300 MG 24 hr tablet Take 1 tablet (300 mg total) by mouth every morning.  . [DISCONTINUED] citalopram (CELEXA) 40 MG tablet Take 1 tablet (40 mg total) by mouth at bedtime.    Past Psychiatric History/Hospitalization(s): Patient has significant history of alcohol and drug use.  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  She attended [facility] in the past .  She had tried Zoloft, Seroquel , Effexor and Lexapro in the past.  Patient has history of mania and depression .   Anxiety: No Bipolar Disorder: Yes Depression: Yes Mania: Yes Psychosis: No Schizophrenia: No Personality Disorder: No Hospitalization for psychiatric illness: Yes History of Electroconvulsive Shock Therapy: No Prior Suicide Attempts: No  Physical Exam: Constitutional:  BP 103/64  Pulse 90  Ht 5' (1.524 m)  Wt 157 lb 6.4 oz (71.396 kg)  BMI 30.74 kg/m2  LMP  07/21/2013  Recent Results (from the past 2160 hour(s))  IPS PAP TEST WITH HPV     Status: None   Collection Time    05/19/13 12:00 AM      Result Value Ref Range   COMMENTS: [facility]     Comment: [place], [place] [address]     [place] [address]     GYN CYTOLOGY REPORT      PATIENT NAME:Nuss, ODESTER NILSON PATHOLOGY#:[id number]: F DOB: June 14, 1970 (Age: 39) MEDICAL RECORD PJKDTO:[id number] DOCTOR:Sarah Barry, CNM DATE  OBTAINED:4/1/2015CLIENT:[facility] DATE RECEIVED:4/2/2015OTHER PHYS: DATE SIGNED:05/21/2013     PAP Thinlayer with HPV     Final Cytologic Interpretation:           Cervical, Thin Layer with Automated Imaging and Dual Review, CPT 88175          Negative for Intraepithelial Lesions or Malignancy.               ADEQUACY OF SPECIMEN:               Satisfactory for evaluation. Endocervical cells/transformation zone component identified.                     2Electronically Signed Out Bydh/4/3/2015NDH, [place](ASCP)[address], [address], [place], [place]. Dir.: Betsy Coder Pap test is a screening mechanism with excellent but not perfect ability to prevent cervical carcinoma.  It has a low, but      significant, diagnostic error rate. The pap test is suboptimal  for detection of glandular lesions.  It should be noted that a negative result does not definitively rule out the presence of disease.Ref: DeMay, RM, The Art and Science of Cytopathology,      [employer], [phone].     HPV Results       High Risk HPV -    Not Detected      A result of "Detected" signifies the presence of one or more high risk types of HPV.  The APTIMA HPV Assay is an in vitro nucleic acid amplification test for the qualitative detection of E6/E7 viral messenger RNA (mRNA) from 14 high-risk types of human      papillomavirus (HPV) in cervical specimens. The high-risk HPV types detected by the assay include: 16, 18, 31, 33, 35, 39, 45, 51, 52, 56, 58, 59, 66, and 68. APTIMA HPV method will be performed on the [employer].      The APTIMA HPV Assay is designed to enhance existing methods for the detection of cervical disease and should be used in conjunction with clinical information derived from other diagnostic and screening tests, physical examinations, and full medical      history in accordance with appropriate patient management procedures.     The APTIMA HPV Assay on ThinPrep(tm)  PreservCyt(tm) specimens is FDA approved on the [employer].The APTIMA HPV Assay on SurePath(tm) specimens was developed and its performance characteristics determined by [facility].  It      has not been cleared or approved by the U.S. Food and Drug Administration.  The FDA has determined that such clearance or approval is not necessary.  This test is used for clinical purposes.  This laboratory is certified under the [place] (CLIA) as qualified to perform high complexity clinical laboratory testing.     Electronically Signed Out By KM, [place](ASCP)501 [address], [place],  [place]. Dir.: Judith Part Swanger     Last Menstrual Period: 04/24/13      Other Clinical Conditions: Previous Abnormal Pap:  ? etiology    General Appearance: alert, oriented, no acute distress, well nourished and casually dressed  Musculoskeletal: Strength & Muscle Tone: within normal limits Gait & Station: normal Patient leans: N/A  Psychiatric: Speech (describe rate, volume, coherence, spontaneity, and abnormalities if any): Clear and coherent with a regular rate and rhythm and normal volume  Thought Process (describe rate, content, abstract reasoning, and computation): Within normal limits  Associations: Intact  Thoughts: normal  Mental Status: Orientation: oriented to person, place, time/date and situation Mood & Affect: depressed affect Attention Span & Concentration: Intact  Established Problem, Stable/Improving (1), Review of Psycho-Social Stressors (1), Review of Last Therapy Session (1) and Review of Medication Regimen & Side Effects (2)  Assessment: Axis I: Bipolar, depressed  Axis II: Deferred  Axis III: GERD, iron deficiency anemia, back pain, Allergic rhinitis, Folliculitis, Chronic headache, Thrombocytosis, Positive H. pylori test    Axis IV: Moderate  Axis V: 55   Plan:  Patient is doing better on Abilify.   Recommended to continue Abilify 5 mg at bedtime along with Celexa and Wellbutrin.  Patient does not have any side effects of medication.  Recommended to call us back if she has any question on a concern.  Followup in 3 months.  ARFEEN,SYED T., MD 08/03/2013

## 2013-08-30 ENCOUNTER — Telehealth: Payer: Self-pay | Admitting: Certified Nurse Midwife

## 2013-08-30 DIAGNOSIS — N946 Dysmenorrhea, unspecified: Secondary | ICD-10-CM

## 2013-08-30 DIAGNOSIS — N852 Hypertrophy of uterus: Secondary | ICD-10-CM

## 2013-08-30 DIAGNOSIS — N840 Polyp of corpus uteri: Secondary | ICD-10-CM

## 2013-08-30 DIAGNOSIS — N920 Excessive and frequent menstruation with regular cycle: Secondary | ICD-10-CM

## 2013-08-30 NOTE — Telephone Encounter (Signed)
Message left to return call to [place] at [phone].   Needs SHGM and Endometrial Bx per Debbi Leonard's notes.  Can have payment plan, will send to Ivar Drape after scheduling.

## 2013-08-30 NOTE — Telephone Encounter (Signed)
I called the patient to remind her that we will collect $230.90 @ TOS. She will then sign the promissory agreement indicating 3 more monthly payments of $230.90. She has already agreed to this (08/02/13 telephone conversation), but I wanted to refresh her memory since the appointment is this week. She has agreed to payment arrangement.

## 2013-08-30 NOTE — Telephone Encounter (Signed)
Patient says she was told to call when her cycle ended to schedule some procedures.

## 2013-08-30 NOTE — Telephone Encounter (Signed)
Sonohysterogram and endometrial bx scheduled with Dr. Sabra Heck for 09/02/13 and patient aware/agreeable to time. LMP 08/24/13. Pre-procedure instructions given. Motrin instructions given. Motrin=Advil=Ibuprofen Can take 800 mg (Can purchase over the counter, you will need four 200 mg pills). Take with food. Make sure to eat a meal and drink fluids prior to appointment.    Advised patient to come at 1530 as she will need to sign promissory note with Ivar Drape prior. She is agreeable to this.  Routing to Dr. Sabra Heck for review  Routing to Ivar Drape for update on appointment.  Routing to [employer] as orders have been placed under Dr. Sabra Heck as prior orders were placed by Regina Eck CNM.

## 2013-08-31 NOTE — Telephone Encounter (Signed)
SHGM scheduled for 09-02-13. Encounter closed

## 2013-09-02 ENCOUNTER — Ambulatory Visit (INDEPENDENT_AMBULATORY_CARE_PROVIDER_SITE_OTHER): Payer: 59 | Admitting: Obstetrics & Gynecology

## 2013-09-02 ENCOUNTER — Ambulatory Visit (INDEPENDENT_AMBULATORY_CARE_PROVIDER_SITE_OTHER): Payer: 59

## 2013-09-02 ENCOUNTER — Other Ambulatory Visit: Payer: Self-pay | Admitting: Obstetrics & Gynecology

## 2013-09-02 VITALS — BP 100/60 | Ht 60.0 in | Wt 157.0 lb

## 2013-09-02 DIAGNOSIS — N920 Excessive and frequent menstruation with regular cycle: Secondary | ICD-10-CM

## 2013-09-02 DIAGNOSIS — N840 Polyp of corpus uteri: Secondary | ICD-10-CM

## 2013-09-02 DIAGNOSIS — N946 Dysmenorrhea, unspecified: Secondary | ICD-10-CM

## 2013-09-02 DIAGNOSIS — N852 Hypertrophy of uterus: Secondary | ICD-10-CM

## 2013-09-02 NOTE — Progress Notes (Signed)
43 y.o. very nice G0 SWF (in long term relationship) here for pelvic ultrasound with sonohystogram due to menorrhagia and dysmenorrhea that has been going on less than a year.  Pt has seen Dr. Radene Knee within the pastyear regarding similar issus.  She was offered hysterectomy only but was informed he didn't really see her problem (per pt hx).  Really just wants to find out what is going on and her options.  Never pregnant.  Does want to try to conceive.  No LMP recorded.  Contraception:  Technique:  Both transabdominal and transvaginal ultrasound examinations of the pelvis were performed. Transabdominal technique was performed for global imaging of the pelvis including uterus, ovaries, adnexal regions, and pelvic cul-de-sac.  It was necessary to proceed with endovaginal exam following the abdominal ultrasound transabdominal exam to visualize the endometrium and adnexa.  Color and duplex Doppler ultrasound was utilized to evaluate blood flow to the ovaries.    FINDINGS: Uterus: 10.1 x 6.5 x 6.1cm with anterior 4cm fibroid Endometrium: 11.41mm Adnexa:  Left: 3.5 x 2.0 x 2.0cm     Right: 4.1 x 2.4 x 2.2cm with dominant follicle 5.6EP Cul de sac: no free fluid seen  SHSG:  After obtaining appropriate verbal consent from patient, the cervix was visualized using a speculum, and prepped with betadine.  A tenaculum  was applied to the cervix.  Dilation of the cervix was not necessary. The IUI catheter was passed into the uterus and sterile saline introduced with poor distention of the uterine cavity due to anterior fibroid.  Speculum replaced.  Cervix re cleansed with Betadine and catheter with sponge tip was placed at cervical os.  Speculum removed and sterile saline instilled, again with poor cavity distension.  Speculum replaced.  Cervix re cleansed with Betadine.  IUI catheter with bulb at tip passed.  Bulb inflated with air, 1.5cc, and then pulled back against cervix.  Sterile saline instilled with  much better distension.  Findings:  Several small filling defects 1.0cm, 1.0cm and 0.9cm consistent with small polyps vs very irregular endometrium.  Doubtful of submucosal fibroids.  The larger 4cm anterior fibroid does not have a submucosal component but these is contour variation of the endometrium due to it pressing down towards the endometrial cavity.   Endometrial biopsy recommended. Verbal and written consent obtained.  Speculum placed.  Cervix visualized and cleansed with betadine prep.  A single toothed tenaculum was applied to the anterior lip of the cervix.  Endometrial pipelle was advanced through the cervix into the endometrial cavity without difficulty.  Pipelle passed to 9cm.  Suction applied and pipelle removed with good tissue sample obtained.  Tenculum removed.  No bleeding noted.  Patient tolerated procedure well.  All instruments removed.  Discussion:  Pt did tolerate all of the procedure very well.  Images reviewed with pt.  As pt is new to me, discussed with her ultimate desires.  She really does not want to do anything to her uterus that will compromise fertility.  Would actually very much like to conceive.  Recognized that age may be a factor.  Has not considered donor egg yet.  Wants to know if fibroid has been a factor.  It think it has and that she may possibly have endometriosis as well.    As she wants to maintain fertility and would like to proceed with myomectomy, feel referral is best option for her.  She is aware, due to age, she may not be able to conceive with her own eggs  but she would at least like as much information as she can get regarding this.  Assessment: Menorrhagia, dysmenorrhea, fibroid, anemia.  Desirous of maintaining her own uterus.  Plan:  Referral to Dr. Kerin Perna. Continue iron. Recommend day 3 FSH.  She will call with onset of next cycle if that is before her referral appointment.  ~25 minutes spent with patient >50% of time was in face to face  discussion of above.

## 2013-09-03 ENCOUNTER — Encounter: Payer: Self-pay | Admitting: Obstetrics & Gynecology

## 2013-09-07 ENCOUNTER — Telehealth: Payer: Self-pay

## 2013-09-07 NOTE — Telephone Encounter (Signed)
Spoke with patient. Results and message given to patient from [place] as seen below. Patient agreeable and verbalizes understanding.  Routing to provider for final review. Patient agreeable to disposition. Will close encounter

## 2013-09-07 NOTE — Telephone Encounter (Signed)
Message copied by Jasmine Awe on Tue Sep 07, 2013 10:05 AM ------      Message from: Aguilar, BROOK E      Created: Mon Sep 06, 2013  6:04 PM       Please report to the patient her benign endometrial biopsy result.      Referral in place for the patient to see Dr. Kerin Perna, reproductive endocrinology/infertility specialist. ------

## 2013-09-08 ENCOUNTER — Telehealth: Payer: Self-pay | Admitting: Obstetrics & Gynecology

## 2013-09-08 NOTE — Telephone Encounter (Signed)
Dr. Charlett Lango office calling to give appointment information. Patient is scheduled for September 9th 2015 @ 2:30am.

## 2013-09-09 NOTE — Telephone Encounter (Signed)
Routing update to Dr. Quincy Simmonds for Dr. Sabra Heck  and will close encounter.

## 2013-09-14 ENCOUNTER — Encounter: Payer: Self-pay | Admitting: Certified Nurse Midwife

## 2013-11-02 ENCOUNTER — Ambulatory Visit (HOSPITAL_COMMUNITY): Payer: Self-pay | Admitting: Psychiatry

## 2013-11-02 ENCOUNTER — Ambulatory Visit (INDEPENDENT_AMBULATORY_CARE_PROVIDER_SITE_OTHER): Payer: 59 | Admitting: Family Medicine

## 2013-11-02 ENCOUNTER — Encounter: Payer: Self-pay | Admitting: Family Medicine

## 2013-11-02 VITALS — BP 100/60 | HR 96 | Ht 60.0 in | Wt 160.0 lb

## 2013-11-02 DIAGNOSIS — F17200 Nicotine dependence, unspecified, uncomplicated: Secondary | ICD-10-CM

## 2013-11-02 DIAGNOSIS — Z23 Encounter for immunization: Secondary | ICD-10-CM

## 2013-11-02 DIAGNOSIS — T7029XA Other effects of high altitude, initial encounter: Secondary | ICD-10-CM

## 2013-11-02 DIAGNOSIS — R141 Gas pain: Secondary | ICD-10-CM

## 2013-11-02 DIAGNOSIS — W9421XA Exposure to reduction in atmospheric pressure while surfacing from deep-water diving, initial encounter: Secondary | ICD-10-CM

## 2013-11-02 DIAGNOSIS — R140 Abdominal distension (gaseous): Secondary | ICD-10-CM

## 2013-11-02 DIAGNOSIS — R142 Eructation: Secondary | ICD-10-CM

## 2013-11-02 DIAGNOSIS — T7020XA Unspecified effects of high altitude, initial encounter: Secondary | ICD-10-CM

## 2013-11-02 DIAGNOSIS — R143 Flatulence: Secondary | ICD-10-CM
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
## 2013-11-02 NOTE — Patient Instructions (Signed)
Keep track of the foods that seem to trigger your abdominal swelling--most likely it will be raw vegetables, beans, possibly dairy (cheese, milk, ice cream).  You might want to consider a week of lactose-free diet if it seems that dairy is a potential trigger. Consider using Simethicone (Gas-X) as needed, vs using Beano prior to eating a food that will trigger the bloating.  Please try and quit smoking--start thinking about why/when you smoke (habit, boredom, stress) in order to come up with effective strategies to cut back or quit. Available resources to help you quit include free counseling through [facility]uitline ([facility]Quitline.com or 1-800-QUITNOW), smoking cessation classes through [facility] (call to find out schedule), over-the-counter nicotine replacements, and e-cigarettes (although this may not help break the hand-mouth habit).  Many insurance companies also have smoking cessation programs (which may decrease the cost of patches, meds if enrolled).  If these methods are not effective for you, and you are motivated to quit, return to discuss the possibility of prescription medications.  Please discuss Chantix with Dr. Adele Schilder before starting.  Ear Barotrauma  Ear barotrauma is ear pain or damage caused by a pressure difference between the inside and outside of the eardrum.  CAUSES   Being too close to an explosion or blast.  Receiving a blow to your ear.  Coming to the surface too rapidly after scuba diving.  Flying in an airplane.  Going to high altitudes within 24 hours after diving.  Undergoing rapid depressurization after working in a pressurized chamber. SYMPTOMS   Dizziness that feels like a spinning, rocking, or tumbling sensation.  Loss of balance.  Nausea.  Ringing in your ear(s) (tinnitus).  Ear pain.  Bleeding from the ear(s).  Sudden partial or complete loss of hearing.  Headache. DIAGNOSIS  Your caregiver may know what is wrong based on  your history. Your caregiver may also use blood work, X-rays, and other tests to make sure that other injuries did not happen. This may be more important if your injury happened while scuba diving or if you traveled to high altitude after diving. TREATMENT  Treatment depends on how bad the injury to your ear(s) is. For example, if there is a risk for infection, antibiotic medicine may be recommended. Your caregiver will make these decisions based on what was found during your exam. With severe injuries, you may need to see a specialist. [place] not travel to high altitudes, work in a pressurized cabin or room, or scuba dive until your caregiver approves.  Keep your ears dry. Use the corner of a towel to get water out of the ears after showering or bathing.  Only take over-the-counter or prescription medicines for pain, discomfort, or fever as directed by your caregiver.  See your caregiver as recommended. SEEK MEDICAL CARE IF:   You have pain that is not relieved by medicine or is getting worse.  You have a fever.  You have any new or unusual discharge from the ear.  The outer ear becomes red or swollen or there is swelling behind your earlobe.  You have problems that may be related to the medicine you are taking. MAKE SURE YOU:   Understand these instructions.  Will watch your condition.  Will get help right away if you are not doing well or get worse. Document Released: 01/07/2006 Document Revised: 04/29/2011 Document Reviewed: 01/23/2007 [facility] Patient Information 2015 [place], [place]. This information is not intended to replace advice given to you  by your health care provider. Make sure you discuss any questions you have with your health care provider.  Call us if your hearing isn't improving over the course of the week (or by early next week) for referral to ENT

## 2013-11-15 ENCOUNTER — Ambulatory Visit (HOSPITAL_COMMUNITY): Payer: Self-pay | Admitting: Psychiatry

## 2013-11-29 ENCOUNTER — Encounter (HOSPITAL_BASED_OUTPATIENT_CLINIC_OR_DEPARTMENT_OTHER): Payer: Self-pay | Admitting: *Deleted

## 2013-11-29 NOTE — Progress Notes (Signed)
To [facility] at 1245-Hg,urine pregnancy on arrival- (surgeon orders pending)Npo after Mn solid food -clear liquids only 12 Mn-till 0630 then NPO.will take nexium in am and refrain from smoking also.

## 2013-11-30 ENCOUNTER — Ambulatory Visit (HOSPITAL_COMMUNITY): Payer: Self-pay | Admitting: Psychiatry

## 2013-12-01 ENCOUNTER — Ambulatory Visit (HOSPITAL_BASED_OUTPATIENT_CLINIC_OR_DEPARTMENT_OTHER)
Admission: RE | Admit: 2013-12-01 | Discharge: 2013-12-01 | Disposition: A | Discharge status: Home / Self Care | Payer: 59 | Source: Ambulatory Visit | Attending: Obstetrics and Gynecology | Admitting: Obstetrics and Gynecology

## 2013-12-01 ENCOUNTER — Encounter (HOSPITAL_BASED_OUTPATIENT_CLINIC_OR_DEPARTMENT_OTHER): Payer: 59 | Admitting: Anesthesiology

## 2013-12-01 ENCOUNTER — Encounter (HOSPITAL_BASED_OUTPATIENT_CLINIC_OR_DEPARTMENT_OTHER)
Admission: RE | Disposition: A | Discharge status: Home / Self Care | Payer: Self-pay | Source: Ambulatory Visit | Attending: Obstetrics and Gynecology

## 2013-12-01 ENCOUNTER — Ambulatory Visit (HOSPITAL_BASED_OUTPATIENT_CLINIC_OR_DEPARTMENT_OTHER): Payer: 59 | Admitting: Anesthesiology

## 2013-12-01 ENCOUNTER — Encounter (HOSPITAL_BASED_OUTPATIENT_CLINIC_OR_DEPARTMENT_OTHER): Payer: Self-pay | Admitting: *Deleted

## 2013-12-01 DIAGNOSIS — Z79899 Other long term (current) drug therapy: Secondary | ICD-10-CM | POA: Diagnosis not present

## 2013-12-01 DIAGNOSIS — K219 Gastro-esophageal reflux disease without esophagitis: Secondary | ICD-10-CM | POA: Diagnosis not present

## 2013-12-01 DIAGNOSIS — N920 Excessive and frequent menstruation with regular cycle: Secondary | ICD-10-CM | POA: Insufficient documentation

## 2013-12-01 DIAGNOSIS — F17210 Nicotine dependence, cigarettes, uncomplicated: Secondary | ICD-10-CM | POA: Insufficient documentation

## 2013-12-01 DIAGNOSIS — F418 Other specified anxiety disorders: Secondary | ICD-10-CM | POA: Insufficient documentation

## 2013-12-01 DIAGNOSIS — D251 Intramural leiomyoma of uterus: Secondary | ICD-10-CM | POA: Diagnosis not present

## 2013-12-01 DIAGNOSIS — F319 Bipolar disorder, unspecified: Secondary | ICD-10-CM | POA: Insufficient documentation

## 2013-12-01 DIAGNOSIS — N946 Dysmenorrhea, unspecified: Secondary | ICD-10-CM | POA: Diagnosis not present

## 2013-12-01 HISTORY — PX: MYOMECTOMY: SHX85

## 2013-12-01 HISTORY — PX: HYSTEROSCOPY W/D&C: SHX1775

## 2013-12-01 LAB — POCT PREGNANCY, URINE: PREG TEST UR: NEGATIVE

## 2013-12-01 LAB — POCT HEMOGLOBIN-HEMACUE: HEMOGLOBIN: 11.5 g/dL — AB (ref 12.0–15.0)

## 2013-12-01 SURGERY — MYOMECTOMY, ABDOMINAL APPROACH
Anesthesia: General | Site: Vagina

## 2013-12-01 MED ORDER — METHYLENE BLUE 1 % INJ SOLN
INTRAMUSCULAR | Status: DC | PRN
  Administered 2013-12-01: 1 mL via SUBMUCOSAL

## 2013-12-01 MED ORDER — MIDAZOLAM HCL 2 MG/2ML IJ SOLN
INTRAMUSCULAR | Status: AC
Start: 2013-12-01 — End: 2013-12-01
  Filled 2013-12-01: qty 2

## 2013-12-01 MED ORDER — ROCURONIUM BROMIDE 100 MG/10ML IV SOLN
INTRAVENOUS | Status: DC | PRN
  Administered 2013-12-01: 30 mg via INTRAVENOUS
  Administered 2013-12-01: 5 mg via INTRAVENOUS
  Administered 2013-12-01: 10 mg via INTRAVENOUS
  Administered 2013-12-01: 20 mg via INTRAVENOUS
  Administered 2013-12-01: 5 mg via INTRAVENOUS

## 2013-12-01 MED ORDER — SODIUM CHLORIDE 0.9 % IR SOLN
Status: DC | PRN
  Administered 2013-12-01: 500 mL

## 2013-12-01 MED ORDER — ONDANSETRON HCL 4 MG PO TABS: 4.0000 mg | ORAL_TABLET | Freq: Three times a day (TID) | ORAL | Status: DC | PRN

## 2013-12-01 MED ORDER — CEFAZOLIN SODIUM-DEXTROSE 2-3 GM-% IV SOLR
2.0000 g | INTRAVENOUS | Status: AC
  Administered 2013-12-01: 2 g via INTRAVENOUS
  Filled 2013-12-01: qty 50

## 2013-12-01 MED ORDER — LACTATED RINGERS IR SOLN
Status: DC | PRN
  Administered 2013-12-01: 1000 mL

## 2013-12-01 MED ORDER — FENTANYL CITRATE 0.05 MG/ML IJ SOLN
INTRAMUSCULAR | Status: DC | PRN
Start: 2013-12-01 — End: 2013-12-01
  Administered 2013-12-01: 100 ug via INTRAVENOUS
  Administered 2013-12-01 (×2): 50 ug via INTRAVENOUS
  Administered 2013-12-01 (×2): 25 ug via INTRAVENOUS
  Administered 2013-12-01: 50 ug via INTRAVENOUS

## 2013-12-01 MED ORDER — PROMETHAZINE HCL 25 MG/ML IJ SOLN
6.2500 mg | INTRAMUSCULAR | Status: DC | PRN
  Filled 2013-12-01: qty 1

## 2013-12-01 MED ORDER — DEXAMETHASONE SODIUM PHOSPHATE 10 MG/ML IJ SOLN
INTRAMUSCULAR | Status: DC | PRN
  Administered 2013-12-01: 10 mg via INTRAVENOUS

## 2013-12-01 MED ORDER — OXYCODONE HCL 5 MG PO TABS
ORAL_TABLET | ORAL | Status: AC
  Filled 2013-12-01: qty 1

## 2013-12-01 MED ORDER — PROPOFOL INFUSION 10 MG/ML OPTIME
INTRAVENOUS | Status: DC | PRN
  Administered 2013-12-01 (×2): 50 mL via INTRAVENOUS
  Administered 2013-12-01: 200 mL via INTRAVENOUS

## 2013-12-01 MED ORDER — EPHEDRINE SULFATE 50 MG/ML IJ SOLN
INTRAMUSCULAR | Status: DC | PRN
  Administered 2013-12-01: 10 mg via INTRAVENOUS

## 2013-12-01 MED ORDER — NEOSTIGMINE METHYLSULFATE 10 MG/10ML IV SOLN
INTRAVENOUS | Status: DC | PRN
  Administered 2013-12-01: 1.5 mg via INTRAVENOUS

## 2013-12-01 MED ORDER — VASOPRESSIN 20 UNIT/ML IJ SOLN
INTRAVENOUS | Status: DC | PRN
  Administered 2013-12-01: 16:00:00 via INTRAMUSCULAR

## 2013-12-01 MED ORDER — FENTANYL CITRATE 0.05 MG/ML IJ SOLN
INTRAMUSCULAR | Status: AC
  Filled 2013-12-01: qty 2

## 2013-12-01 MED ORDER — LACTATED RINGERS IV SOLN
INTRAVENOUS | Status: DC
  Administered 2013-12-01 (×2): via INTRAVENOUS
  Filled 2013-12-01: qty 1000

## 2013-12-01 MED ORDER — ONDANSETRON HCL 4 MG/2ML IJ SOLN
INTRAMUSCULAR | Status: DC | PRN
  Administered 2013-12-01: 4 mg via INTRAVENOUS

## 2013-12-01 MED ORDER — FENTANYL CITRATE 0.05 MG/ML IJ SOLN
INTRAMUSCULAR | Status: AC
  Filled 2013-12-01: qty 4

## 2013-12-01 MED ORDER — OXYCODONE HCL 5 MG PO TABS
5.0000 mg | ORAL_TABLET | Freq: Once | ORAL | Status: AC
  Administered 2013-12-01: 5 mg via ORAL
  Filled 2013-12-01: qty 1

## 2013-12-01 MED ORDER — FENTANYL CITRATE 0.05 MG/ML IJ SOLN
25.0000 ug | INTRAMUSCULAR | Status: DC | PRN
  Administered 2013-12-01 (×2): 50 ug via INTRAVENOUS
  Filled 2013-12-01: qty 1

## 2013-12-01 MED ORDER — BUPIVACAINE HCL (PF) 0.25 % IJ SOLN
INTRAMUSCULAR | Status: DC | PRN
  Administered 2013-12-01: 14 mL

## 2013-12-01 MED ORDER — LIDOCAINE HCL (CARDIAC) 20 MG/ML IV SOLN
INTRAVENOUS | Status: DC | PRN
  Administered 2013-12-01: 80 mg via INTRAVENOUS

## 2013-12-01 MED ORDER — CEFAZOLIN SODIUM-DEXTROSE 2-3 GM-% IV SOLR
INTRAVENOUS | Status: AC
  Filled 2013-12-01: qty 50

## 2013-12-01 MED ORDER — MIDAZOLAM HCL 5 MG/5ML IJ SOLN
INTRAMUSCULAR | Status: DC | PRN
  Administered 2013-12-01: 2 mg via INTRAVENOUS

## 2013-12-01 MED ORDER — OXYCODONE-ACETAMINOPHEN 7.5-325 MG PO TABS: 1.0000 | ORAL_TABLET | ORAL | Status: DC | PRN

## 2013-12-01 MED ORDER — ACETAMINOPHEN 10 MG/ML IV SOLN
INTRAVENOUS | Status: DC | PRN
  Administered 2013-12-01: 1000 mg via INTRAVENOUS

## 2013-12-01 MED ORDER — GLYCOPYRROLATE 0.2 MG/ML IJ SOLN
INTRAMUSCULAR | Status: DC | PRN
Start: 2013-12-01 — End: 2013-12-01
  Administered 2013-12-01: .2 mg via INTRAVENOUS

## 2013-12-01 MED ORDER — KETOROLAC TROMETHAMINE 30 MG/ML IJ SOLN
INTRAMUSCULAR | Status: DC | PRN
  Administered 2013-12-01: 30 mg via INTRAVENOUS

## 2013-12-01 SURGICAL SUPPLY — 72 items
BAG URINE DRAINAGE (UROLOGICAL SUPPLIES) ×4 IMPLANT
BLADE SURG 10 STRL SS (BLADE) ×4 IMPLANT
BLADE SURG 11 STRL SS (BLADE) ×8 IMPLANT
CANISTER SUCTION 2500CC (MISCELLANEOUS) ×4 IMPLANT
CANNULA CURETTE W/SYR 6 (CANNULA) IMPLANT
CANNULA CURETTE W/SYR 6MM (CANNULA)
CANNULA CURETTE W/SYR 7 (CANNULA) IMPLANT
CANNULA CURETTE W/SYR 7MM (CANNULA)
CATH FOLEY 2WAY SLVR  5CC 16FR (CATHETERS) ×2
CATH FOLEY 2WAY SLVR 5CC 16FR (CATHETERS) ×2 IMPLANT
CATH ROBINSON RED A/P 16FR (CATHETERS) ×4 IMPLANT
CLOSURE WOUND 1/2 X4 (GAUZE/BANDAGES/DRESSINGS) ×1
CORD ACTIVE DISPOSABLE (ELECTRODE)
CORD ELECTRO ACTIVE DISP (ELECTRODE) IMPLANT
COVER TABLE BACK 60X90 (DRAPES) ×4 IMPLANT
DRAPE CAMERA CLOSED 9X96 (DRAPES) ×4 IMPLANT
DRAPE HYSTEROSCOPY (DRAPE) IMPLANT
DRAPE LG THREE QUARTER DISP (DRAPES) ×8 IMPLANT
DRSG TEGADERM 2-3/8X2-3/4 SM (GAUZE/BANDAGES/DRESSINGS) ×4 IMPLANT
ELECT LOOP GYNE PRO 24FR (CUTTING LOOP)
ELECT REM PT RETURN 9FT ADLT (ELECTROSURGICAL) ×8
ELECT VAPORTRODE GRVD BAR (ELECTRODE) IMPLANT
ELECTRODE KNIFE SHAPED 22FR (ELECTROSURGICAL) IMPLANT
ELECTRODE LOOP GYNE PRO 24FR (CUTTING LOOP) IMPLANT
ELECTRODE REM PT RTRN 9FT ADLT (ELECTROSURGICAL) ×4 IMPLANT
GAUZE SPONGE 4X4 16PLY XRAY LF (GAUZE/BANDAGES/DRESSINGS) ×4 IMPLANT
GLOVE BIO SURGEON STRL SZ8 (GLOVE) ×12 IMPLANT
GLOVE BIOGEL M 6.5 STRL (GLOVE) ×8 IMPLANT
GLOVE BIOGEL PI IND STRL 8.5 (GLOVE) ×2 IMPLANT
GLOVE BIOGEL PI INDICATOR 8.5 (GLOVE) ×2
GLOVE INDICATOR 6.5 STRL GRN (GLOVE) ×12 IMPLANT
GOWN PREVENTION PLUS LG XLONG (DISPOSABLE) ×8 IMPLANT
GOWN STRL REUS W/ TWL XL LVL3 (GOWN DISPOSABLE) ×2 IMPLANT
GOWN STRL REUS W/TWL XL LVL3 (GOWN DISPOSABLE) ×2
LEGGING LITHOTOMY PAIR STRL (DRAPES) IMPLANT
LOOP ANGLED CUTTING 22FR (CUTTING LOOP) IMPLANT
NEEDLE HYPO 22GX1.5 SAFETY (NEEDLE) ×4 IMPLANT
NEEDLE INSUFFLATION 14GA 120MM (NEEDLE) ×4 IMPLANT
NEEDLE SPNL 22GX3.5 QUINCKE BK (NEEDLE) IMPLANT
NS IRRIG 500ML POUR BTL (IV SOLUTION) ×4 IMPLANT
PACK BASIN DAY SURGERY FS (CUSTOM PROCEDURE TRAY) ×4 IMPLANT
PAD OB MATERNITY 4.3X12.25 (PERSONAL CARE ITEMS) ×4 IMPLANT
PENCIL BUTTON HOLSTER BLD 10FT (ELECTRODE) ×4 IMPLANT
SEPRAFILM MEMBRANE 5X6 (MISCELLANEOUS) ×4 IMPLANT
SET IRRIG TUBING LAPAROSCOPIC (IRRIGATION / IRRIGATOR) ×4 IMPLANT
SET IRRIG Y TYPE TUR BLADDER L (SET/KITS/TRAYS/PACK) IMPLANT
SET TUBING HYSTEROSCOPY 2 NDL (TUBING) IMPLANT
SHEET LAVH (DRAPES) ×4 IMPLANT
SOLUTION ANTI FOG 6CC (MISCELLANEOUS) ×4 IMPLANT
SPONGE LAP 4X18 X RAY DECT (DISPOSABLE) ×4 IMPLANT
STENT BALLN UTERINE 3CM 6FR (Stent) IMPLANT
STENT BALLN UTERINE 4CM 6FR (STENTS) IMPLANT
STRIP CLOSURE SKIN 1/2X4 (GAUZE/BANDAGES/DRESSINGS) ×3 IMPLANT
SUT MNCRL AB 4-0 PS2 18 (SUTURE) ×8 IMPLANT
SUT SILK 2 0 SH (SUTURE) IMPLANT
SUT SILK 3 0 PS 1 (SUTURE) IMPLANT
SUT VIC AB 2-0 CT1 (SUTURE) ×4 IMPLANT
SUT VIC AB 2-0 CT2 27 (SUTURE) ×8 IMPLANT
SUT VIC AB 4-0 SH 27 (SUTURE) ×10
SUT VIC AB 4-0 SH 27XANBCTRL (SUTURE) ×10 IMPLANT
SUT VICRYL 0 AB UR-6 (SUTURE) ×4 IMPLANT
SYR 20CC LL (SYRINGE) ×4 IMPLANT
SYR 3ML 18GX1 1/2 (SYRINGE) IMPLANT
SYR CONTROL 10ML LL (SYRINGE) ×8 IMPLANT
SYS LAPSCP GELPORT 120MM (MISCELLANEOUS) ×4
SYSTEM LAPSCP GELPORT 120MM (MISCELLANEOUS) ×2 IMPLANT
TOWEL OR 17X24 6PK STRL BLUE (TOWEL DISPOSABLE) ×8 IMPLANT
TRAY DSU PREP LF (CUSTOM PROCEDURE TRAY) ×4 IMPLANT
TROCAR OPTI TIP 5M 100M (ENDOMECHANICALS) ×12 IMPLANT
TUBE HYSTEROSCOPY W Y-CONNECT (TUBING) IMPLANT
WARMER LAPAROSCOPE (MISCELLANEOUS) ×4 IMPLANT
WATER STERILE IRR 500ML POUR (IV SOLUTION) IMPLANT

## 2013-12-01 NOTE — Anesthesia Procedure Notes (Addendum)
Procedure Name: Intubation Date/Time: 12/01/2013 2:51 PM Performed by: Wanita Chamberlain Pre-anesthesia Checklist: Patient identified, Emergency Drugs available, Suction available, Patient being monitored and Timeout performed Patient Re-evaluated:Patient Re-evaluated prior to inductionOxygen Delivery Method: Circle system utilized Preoxygenation: Pre-oxygenation with 100% oxygen Intubation Type: IV induction Ventilation: Mask ventilation without difficulty Laryngoscope Size: Mac and 3 Grade View: Grade III Tube type: Oral Tube size: 7.0 mm Number of attempts: 2 Airway Equipment and Method: Stylet Placement Confirmation: ETT inserted through vocal cords under direct vision,  positive ETCO2 and breath sounds checked- equal and bilateral Secured at: 21 cm Tube secured with: Tape Dental Injury: Teeth and Oropharynx as per pre-operative assessment  Comments: Laryngoscopy X 2 pharynx w/ large amount of soft tissue / small mouth

## 2013-12-01 NOTE — Transfer of Care (Signed)
Immediate Anesthesia Transfer of Care Note  Patient: Sarah Barry  Procedure(s) Performed: Procedure(s): LAPAROSCOPY/GELPORT ASSISTED MYOMECTOMY/ADENOMYOMECTOMY (N/A) INSERTION OF SEPRAFILM (N/A)  Patient Location: PACU  Anesthesia Type:General  Level of Consciousness: awake, alert , oriented and patient cooperative  Airway & Oxygen Therapy: Patient Spontanous Breathing and Patient connected to nasal cannula oxygen  Post-op Assessment: Report given to PACU RN and Post -op Vital signs reviewed and stable  Post vital signs: Reviewed and stable  Complications: No apparent anesthesia complications

## 2013-12-01 NOTE — Discharge Instructions (Signed)
HOME CARE INSTRUCTIONS - LAPAROSCOPY  Wound Care: The bandaids or dressing which are placed over the skin openings may be removed the day after surgery. The incision should be kept clean and dry. The stitches do not need to be removed. Should the incision become sore, red, and swollen after the first week, check with your doctor.  Personal Hygiene: Shower the day after your procedure. Always wipe from front to back after elimination.   Activity: Do not drive or operate any equipment today. The effects of the anesthesia are still present and drowsiness may result. Rest today, not necessarily flat bed rest, just take it easy. You may resume your normal activity in one to three days or as instructed by your physician.  Sexual Activity: You resume sexual activity as indicated by your physician_________. If your laparoscopy was for a sterilization ( tubes tied ), continue current method of birth control until after your next period or ask for specific instructions from your doctor.  Diet: Eat a light diet as desired this evening. You may resume a regular diet tomorrow.  Return to Work: Two to three days or as indicated by your doctor.  Expectations After Surgery: Your surgery will cause vaginal drainage or spotting which may continue for 2-3 days. Mild abdominal discomfort or tenderness is not unusual and some shoulder pain may also be noted which can be relieved by lying flat in pain. You may pass some gelatinous material vaginally. Do not be alarmed.  This was placed inside the uterus to prevent the walls from sticking together.  Call Your Doctor If these Occur:  Persistent or heavy bleeding at incision site       Redness or swelling around incision       Elevation of temperature greater than 100 degrees F  Call for follow-up appointment _____________.    Post Anesthesia Home Care Instructions  Activity: Get plenty of rest for the remainder of the day. A responsible adult should stay with  you for 24 hours following the procedure.  For the next 24 hours, DO NOT: -Drive a car -Paediatric nurse -Drink alcoholic beverages -Take any medication unless instructed by your physician -Make any legal decisions or sign important papers.  Meals: Start with liquid foods such as gelatin or soup. Progress to regular foods as tolerated. Avoid greasy, spicy, heavy foods. If nausea and/or vomiting occur, drink only clear liquids until the nausea and/or vomiting subsides. Call your physician if vomiting continues.  Special Instructions/Symptoms: Your throat may feel dry or sore from the anesthesia or the breathing tube placed in your throat during surgery. If this causes discomfort, gargle with warm salt water. The discomfort should disappear within 24 hours.

## 2013-12-01 NOTE — Op Note (Signed)
OPERATIVE NOTE  Preoperative diagnosis: Intramural myoma, menorrhagia and dysmenorrhea, rule out endometrial polyp  Postoperative diagnosis: Anterior large adenomyoma (pathology pending), menorrhagia and dysmenorrhea  Procedure: Laparoscopy, GelPort assisted (adeno-)myomectomy, intrauterine stent placement (Seprafilm roll)  Anesthesia: Gen. Endotracheal  Surgeon:Imraan Wendell  Complications: None  Estimated blood loss: 200 cc  Specimens: Anterior myometrial mass and posterior wall biopsies to pathology  Findings: On exam under anesthesia the uterus was 8-9 weeks size and firm and regular shape. There were no adnexal masses or posterior fornix nodularity. On laparoscopy, liver edge and diaphragm surfaces were normal. Gallbladder was surgically absent. The appendix was normal.   The uterus sounded to 10 cm and had a globular shape with a slight thickening of the anterior wall. Both fallopian tubes were normal proximally and distally. The right ovary was normal. The left ovary had a follicular cyst. There were no peritoneal lesions of endometriosis.  There was no surgical pseudocapsule to the anterior mass in the uterus which measured approximately 5 cm. Mass could be a debulked after careful and length the dissection with similar, the dense tissue still remaining behind in the myometrium. The endometrial cavity was entered. Polyps were not noted.  Description of the procedure: Patient was placed in dorsal supine position and general endotracheal anesthesia was given. 2 g of cefazolin were given intravenously for prophylaxis. She  was placed in lithotomy position and prepped and draped inside manner for laparoscopy. A Foley catheter was inserted into the bladder. A vaginal speculum was inserted and the uterus sounded to 10 cm. A ZUMI uterine manipulator was inserted into the uterus and its balloon was inflated. Methylene blue was injected throughout the procedure to define the endometrial  cavity.  All incision sites were preemptively anesthetized with quarter percent bupivacaine. An intraumbilical skin incision was made at the existing scar, about 5 mm. A Verress needle was inserted and it appeared that the previous scar had attenuated fascial approximation. A pneumoperitoneum was created with carbon dioxide. A 5 mm trocar was inserted and 5 mm 30 laparoscope was introduced. Video laparoscopy was started. 2 more 5 mm on transverse incisions were made under direct visualization in each lower quadrant.  A 3 cm suprapubic transverse incision was made at the pubic hairline and after dissection of the anatomic layers, the peritoneal cavity was entered. A GelPort was placed and provided further access into the pelvic cavity throughout the procedure. Above findings were noted. A dilute vasopressin solution was injected into the myometrium until good blanching was obtained. At this point I palpated and only then did convince myself that in fact there was an anterior firm mass, as the mass was not bulging from the uterine surface. A transverse myometrial incision was made using needle electrode at a cutting current of 91 W. The anterior heterogeneous tissue was noted but it did not protrude from the incision, nor didn't have any surgical pseudocapsule. At this point the GelPort was opened and the rest of the meticulous dissection was carried out manually through the 3 cm opening. What appeared to be the adenomyosis-type tissue was carefully clamped with towel clips and a Serial was artificially created separating it from the compressed myometrium. The dissection was carried in all directions and endometrial cavity was entered to the right side of the mass. It was noted that the mass was spreading posteriorly,  intramural portion of the left tube. After in situ morcellation of the mass in the posterior portion was also carved out.  Other heterogeneous tissue was noted in  the remaining posterior myometrium  and a biopsy with was taken to rule out more diffuse involvement of the posterior wall with adenomyosis. The remaining uterine structure was carefully reconstructed using 2 layers of 2-0 Vicryl continuous suture after approximating the endometrium with 4-0 Vicryl in submucosal stitches. The serosa of the uterus was closed approximated with 4-0 Vicryl continuous suture. Laparoscopy was resumed and the hemostasis of the uterine incision/and posterior laceration was confirmed. Pelvis was copiously irrigated and aspirated. The fascia of the intraumbilical incision and the suprapubic incision was approximated with 2-0 Vicryl suture. The skin was approximated with 4-0 Monocryl subcuticular stitches. Next I dilated the cervix to 10 mm with Hegar dilators after it now sounded to 8 cm and carefully inserted a rolled up a large sheet of Seprafilm using a long smooth DeBakey forceps, up to the premarked length of 8 cm, to serve as an adhesion barrier in the uterus.  The patient tolerated the procedure well and was transferred to recovery room in satisfactory condition.  SPECIAL NOTE: The patient needs a cesarean delivery for any future pregnancy. I will do a saline contrast sonohysterography in 6-8 weeks, if she remains interested in future conception.    Governor Specking

## 2013-12-01 NOTE — Anesthesia Preprocedure Evaluation (Addendum)
Anesthesia Evaluation  Patient identified by MRN, date of birth, ID band Patient awake    Reviewed: Allergy & Precautions, H&P , NPO status , Patient's Chart, lab work & pertinent test results  Airway Mallampati: II TM Distance: >3 FB Neck ROM: Full    Dental no notable dental hx. (+) Teeth Intact, Dental Advisory Given, Lower Dentures   Pulmonary neg pulmonary ROS, Current Smoker,  breath sounds clear to auscultation  Pulmonary exam normal       Cardiovascular Exercise Tolerance: Good negative cardio ROS  Rhythm:Regular Rate:Normal     Neuro/Psych  Headaches, PSYCHIATRIC DISORDERS Anxiety Depression Bipolar Disorder    GI/Hepatic Neg liver ROS, GERD-  Medicated and Controlled,  Endo/Other  negative endocrine ROS  Renal/GU negative Renal ROS  Female GU complaint     Musculoskeletal negative musculoskeletal ROS (+)   Abdominal   Peds negative pediatric ROS (+)  Hematology negative hematology ROS (+)   Anesthesia Other Findings   Reproductive/Obstetrics negative OB ROS                      Anesthesia Physical Anesthesia Plan  ASA: II  Anesthesia Plan: General   Post-op Pain Management:    Induction: Intravenous  Airway Management Planned: Oral ETT  Additional Equipment:   Intra-op Plan:   Post-operative Plan: Extubation in OR  Informed Consent: I have reviewed the patients History and Physical, chart, labs and discussed the procedure including the risks, benefits and alternatives for the proposed anesthesia with the patient or authorized representative who has indicated his/her understanding and acceptance.   Dental advisory given  Plan Discussed with: CRNA  Anesthesia Plan Comments:        Anesthesia Quick Evaluation

## 2013-12-01 NOTE — H&P (Addendum)
Sarah Barry is a 43 y.o. female , originally referred to me by Dr. Edwinna Areola, for myomectomy.  She was diagnosed with a 4cm fibroid because of menorrhagia and dysmenorrhea.  She has been treated with NSAIDs and iron supplementation. She has been having monthly periods but with heavy flow and prolonged duration. She had an endometrial biopsy which was benign. A saline contrast SAG as noted pelvic ultrasound showed an 11 mm endometrium and is 0.9 cm polypoid area was noted. A 4 cm anterior intramural mass was not noted to have a submucosal component.  Patient would like to preserve her childbearing potential.  Pertinent Gynecological History: Menses: flow is excessive with use of 3 pads or tampons on heaviest days Contraception: none DES exposure: denies Blood transfusions: none Sexually transmitted diseases: no past history Previous GYN Procedures: none Last mammogram: normal Last pap: normal  OB History: Gravida 0   Menstrual History: No LMP recorded.    Past Medical History  Diagnosis Date  . GERD (gastroesophageal reflux disease)   . Allergic rhinitis   . Folliculitis   . Chronic headache     during cycle  . Thrombocytosis   . Bipolar disorder   . Positive H. pylori test   . Depression   . Fatigue   . Tongue disorder   . Anemia   . Cancer     skin ca - buttocks  . Colon polyp 04/2012    tubular adenoma  . Fibroid   . HSV infection   . Dysmenorrhea 05/19/2013                    Past Surgical History  Procedure Laterality Date  . Skin cancer excision      buttock area, Dr. Marlou Starks  . Colonoscopy  04/2012    tubular adenoma  . Colposcopy  2014  . Cholecystectomy  2000             Family History  Problem Relation Age of Onset  . Colon cancer Maternal Grandfather   . Cancer Maternal Grandfather     colon cancer  . Depression Maternal Grandfather   . Diabetes Paternal Grandmother   . Alcohol abuse Father   . Physical abuse Father   . Heart disease  Father 16    MI  . Hyperlipidemia Father   . Hypertension Father   . Depression Father   . Esophageal cancer Neg Hx   . Rectal cancer Neg Hx   . Stomach cancer Neg Hx   . Osteoporosis Maternal Grandmother    No hereditary disease.  No cancer of breast, ovary, uterus. No cutaneous leiomyomatosis or renal cell carcinoma.  History   Social History  . Marital Status: Single    Spouse Name: N/A    Number of Children: 0  . Years of Education: N/A   Occupational History  . CNA at [employer]   .     Social History Main Topics  . Smoking status: Current Every Day Smoker -- 1.00 packs/day for 13 years    Types: Cigarettes  . Smokeless tobacco: Never Used  . Alcohol Use: No  . Drug Use: No  . Sexual Activity: Not Currently    Birth Control/ Protection: Abstinence   Other Topics Concern  . Not on file   Social History Narrative   Single.  Lives alone, has 1 dog.    No Known Allergies  No current facility-administered medications on file prior to encounter.   Current Outpatient  Prescriptions on File Prior to Encounter  Medication Sig Dispense Refill  . ampicillin (PRINCIPEN) 500 MG capsule Take 500 mg by mouth daily.       Marland Kitchen acyclovir (ZOVIRAX) 200 MG capsule Take 1 capsule (200 mg total) by mouth every 4 (four) hours while awake.  90 capsule  3  . ARIPiprazole (ABILIFY) 5 MG tablet Take 1 tablet (5 mg total) by mouth daily.  30 tablet  2  . buPROPion (WELLBUTRIN XL) 300 MG 24 hr tablet Take 1 tablet (300 mg total) by mouth every morning.  30 tablet  2  . citalopram (CELEXA) 40 MG tablet Take 1 tablet (40 mg total) by mouth at bedtime.  30 tablet  2  . esomeprazole (NEXIUM) 40 MG capsule Take 1 capsule (40 mg total) by mouth 2 (two) times daily.  180 capsule  3  . ferrous sulfate 324 (65 FE) MG TBEC Take 1 tablet (325 mg total) by mouth 2 (two) times daily. With meals.  60 tablet  1  . HYPERCARE 20 % external solution       . Multiple Vitamins-Minerals  (MULTIVITAMINS THER. W/MINERALS) TABS Take 1 tablet by mouth daily.        . [DISCONTINUED] QUEtiapine (SEROQUEL) 100 MG tablet Take 150 mg by mouth at bedtime.           Review of Systems  Constitutional: Negative.   HENT: Negative.   Eyes: Negative.   Respiratory: Negative.   Cardiovascular: Negative.   Gastrointestinal: Negative.   Genitourinary: Negative.   Musculoskeletal: Negative.   Skin: Negative.   Neurological: Negative.   Endo/Heme/Allergies: Negative.   Psychiatric/Behavioral: Negative.      Physical Exam  Ht 5' (1.524 m)  Wt 72.576 kg (160 lb)  BMI 31.25 kg/m2  LMP 11/22/2013 Constitutional: She is oriented to person, place, and time. She appears well-developed and well-nourished.  HENT:  Head: Normocephalic and atraumatic.  Nose: Nose normal.  Mouth/Throat: Oropharynx is clear and moist. No oropharyngeal exudate.  Eyes: Conjunctivae normal and EOM are normal. Pupils are equal, round, and reactive to light. No scleral icterus.  Neck: Normal range of motion. Neck supple. No tracheal deviation present. No thyromegaly present.  Cardiovascular: Normal rate.   Respiratory: Effort normal and breath sounds normal.  GI: Soft. Bowel sounds are normal. She exhibits no distension and no mass. There is no tenderness.  Lymphadenopathy:    She has no cervical adenopathy.  Neurological: She is alert and oriented to person, place, and time. She has normal reflexes.  Skin: Skin is warm.  Psychiatric: She has a normal mood and affect. Her behavior is normal. Judgment and thought content normal.    Assessment/Plan:  Assessment: Menorrhagia, Dysmenorrhea, Uterine Fibroid (4cm anterior)  Endometrial Polyp, Anemia Desire to preserve childbearing potential.  Possible endometriosis causing dysmenorrhea and pelvic pain.  Plan: Patient is scheduled for hysteroscopy, polypectomy, D&C. Laparascopic Myomectomy with a GelPort assistance,  possible excision of endometriosis. Pt will  continue iron supplements for anemia.  Governor Specking

## 2013-12-01 NOTE — Anesthesia Postprocedure Evaluation (Signed)
  Anesthesia Post-op Note  Patient: Sarah Barry  Procedure(s) Performed: Procedure(s): LAPAROSCOPY/GELPORT ASSISTED MYOMECTOMY/ADENOMYOMECTOMY (N/A) INSERTION OF SEPRAFILM (N/A)  Patient Location: PACU  Anesthesia Type:General  Level of Consciousness: awake, alert  and oriented  Airway and Oxygen Therapy: Patient Spontanous Breathing  Post-op Pain: mild  Post-op Assessment: Post-op Vital signs reviewed, Patient's Cardiovascular Status Stable, Respiratory Function Stable, Patent Airway, No signs of Nausea or vomiting and Pain level controlled  Post-op Vital Signs: Reviewed and stable  Last Vitals:  Filed Vitals:   12/01/13 1845  BP: 116/64  Pulse: 93  Temp:   Resp: 15    Complications: No apparent anesthesia complications

## 2013-12-02 ENCOUNTER — Encounter (HOSPITAL_BASED_OUTPATIENT_CLINIC_OR_DEPARTMENT_OTHER): Payer: Self-pay | Admitting: Obstetrics and Gynecology

## 2013-12-06 ENCOUNTER — Telehealth (HOSPITAL_COMMUNITY): Payer: Self-pay

## 2013-12-15 ENCOUNTER — Encounter: Payer: Self-pay | Admitting: Family Medicine

## 2013-12-16 ENCOUNTER — Other Ambulatory Visit (HOSPITAL_COMMUNITY): Payer: Self-pay | Admitting: Psychiatry

## 2013-12-16 ENCOUNTER — Telehealth (HOSPITAL_COMMUNITY): Payer: Self-pay | Admitting: Psychiatry

## 2013-12-16 NOTE — Telephone Encounter (Signed)
I returned patient's phone call.  She is no longer taking Abilify because insurance does not cover.  She does not see any changes.  She is doing fine with Wellbutrin and Celexa.  I will discuss more options on her next appointment.

## 2013-12-21 ENCOUNTER — Encounter: Payer: Self-pay | Admitting: Family Medicine

## 2013-12-21 ENCOUNTER — Telehealth: Payer: Self-pay | Admitting: Gastroenterology

## 2013-12-21 NOTE — Telephone Encounter (Signed)
Left message for patient to call back  

## 2013-12-22 ENCOUNTER — Ambulatory Visit (INDEPENDENT_AMBULATORY_CARE_PROVIDER_SITE_OTHER): Payer: 59 | Admitting: Psychiatry

## 2013-12-22 ENCOUNTER — Encounter (HOSPITAL_COMMUNITY): Payer: Self-pay | Admitting: Psychiatry

## 2013-12-22 VITALS — BP 106/67 | HR 80 | Ht 60.0 in | Wt 156.0 lb

## 2013-12-22 DIAGNOSIS — F319 Bipolar disorder, unspecified: Secondary | ICD-10-CM

## 2013-12-22 DIAGNOSIS — F3131 Bipolar disorder, current episode depressed, mild: Secondary | ICD-10-CM

## 2013-12-22 MED ORDER — LAMOTRIGINE 25 MG PO TABS: ORAL_TABLET | ORAL | Status: DC

## 2013-12-22 MED ORDER — BUPROPION HCL ER (XL) 300 MG PO TB24: 300.0000 mg | ORAL_TABLET | ORAL | Status: DC

## 2013-12-22 MED ORDER — ESOMEPRAZOLE MAGNESIUM 40 MG PO CPDR: 40.0000 mg | DELAYED_RELEASE_CAPSULE | Freq: Two times a day (BID) | ORAL | Status: DC

## 2013-12-22 MED ORDER — CITALOPRAM HYDROBROMIDE 40 MG PO TABS: 40.0000 mg | ORAL_TABLET | Freq: Every day | ORAL | Status: DC

## 2013-12-22 NOTE — Addendum Note (Signed)
Addended by: Marlon Pel on: 12/22/2013 02:35 PM   Modules accepted: Orders

## 2013-12-22 NOTE — Progress Notes (Signed)
[facility] [phone] Progress Note  Sarah Barry [id number] 43 y.o.  Chief Complaint:  My insurance stopped Abilify.  I'm not taking Abilify for 2 weeks.  I'm feeling sad and depressed.        History of Present Illness:   Sarah Barry came for her followup appointment.  She is not taking Abilify because her insurance does not cover Abilify anymore.  She is taking Celexa and Wellbutrin.  She has noticed more depressed, isolated and withdrawn.  She is sleeping good but sometimes she having racing thoughts and irritability.  She felt lack of energy and motivation to do things.  She is not drinking heavily however admitted in past 3 months she drank twice.  She denies any binge or any intoxication.  She is not using any other illegal substances.  She has no tremors or shakes.  She likes her job.  She denies any anger or any paranoia.  She lives alone but her boyfriend usually stays with her.  She has no children.  She admitted going to [employer] meeting sometimes but not regularly.  Her appetite is okay.  Her vitals are unchanged from the past.  She had recently surgeries for fibroid and she is recovering from it.  She is pleased that surgery went well.  Suicidal Ideation: No Plan Formed: No Patient has means to carry out plan: No  Homicidal Ideation: No Plan Formed: No Patient has means to carry out plan: No  Review of Systems: Psychiatric: Agitation: No Hallucination: No Depressed Mood: Yes Insomnia: Yes Hypersomnia: No Altered Concentration: No Feels Worthless: Yes Grandiose Ideas: No Belief In Special Powers: No New/Increased Substance Abuse: No Compulsions: No  Neurologic: Headache: No Seizure: No Paresthesias: No  Past Medical History:  Patient has history of GERD, iron deficiency anemia, back pain, Allergic rhinitis, Folliculitis, Chronic headache, Thrombocytosis and Positive H. pylori test.     Outpatient Encounter Prescriptions as of 12/22/2013  Medication Sig  . acyclovir  (ZOVIRAX) 200 MG capsule Take 1 capsule (200 mg total) by mouth every 4 (four) hours while awake.  Marland Kitchen ampicillin (PRINCIPEN) 500 MG capsule Take 500 mg by mouth daily.   Marland Kitchen buPROPion (WELLBUTRIN XL) 300 MG 24 hr tablet Take 1 tablet (300 mg total) by mouth every morning.  . citalopram (CELEXA) 40 MG tablet Take 1 tablet (40 mg total) by mouth at bedtime.  Marland Kitchen esomeprazole (NEXIUM) 40 MG capsule Take 1 capsule (40 mg total) by mouth 2 (two) times daily.  Marland Kitchen HYPERCARE 20 % external solution   . lamoTRIgine (LAMICTAL) 25 MG tablet Take 1 tab daily for 1 week and than 2 daily  . Multiple Vitamins-Minerals (MULTIVITAMINS THER. W/MINERALS) TABS Take 1 tablet by mouth daily.    . [DISCONTINUED] ARIPiprazole (ABILIFY) 5 MG tablet Take 1 tablet (5 mg total) by mouth daily.  . [DISCONTINUED] buPROPion (WELLBUTRIN XL) 300 MG 24 hr tablet Take 1 tablet (300 mg total) by mouth every morning.  . [DISCONTINUED] citalopram (CELEXA) 40 MG tablet Take 1 tablet (40 mg total) by mouth at bedtime.  . [DISCONTINUED] ferrous sulfate 324 (65 FE) MG TBEC Take 1 tablet (325 mg total) by mouth 2 (two) times daily. With meals.  . [DISCONTINUED] ondansetron (ZOFRAN) 4 MG tablet Take 1 tablet (4 mg total) by mouth every 8 (eight) hours as needed for nausea or vomiting.  . [DISCONTINUED] oxyCODONE-acetaminophen (PERCOCET) 7.5-325 MG per tablet Take 1 tablet by mouth every 4 (four) hours as needed for pain.    Past  Psychiatric History/Hospitalization(s): Patient has significant history of alcohol and drug use.  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  She attended [facility] in the past .  She had tried Zoloft, Seroquel , Effexor and Lexapro in the past.  Patient has history of mania and depression .   Anxiety: No Bipolar Disorder: Yes Depression: Yes Mania: Yes Psychosis: No Schizophrenia: No Personality Disorder: No Hospitalization for psychiatric illness: Yes History of Electroconvulsive Shock Therapy: No Prior  Suicide Attempts: No  Recent Results (from the past 2160 hour(s))  Hemoglobin-hemacue, POC     Status: Abnormal   Collection Time: 12/01/13  1:55 PM  Result Value Ref Range   Hemoglobin 11.5 (L) 12.0 - 15.0 g/dL  Pregnancy, urine POC     Status: None   Collection Time: 12/01/13  1:56 PM  Result Value Ref Range   Preg Test, Ur NEGATIVE NEGATIVE    Comment:        THE SENSITIVITY OF THIS METHODOLOGY IS >24 mIU/mL   Physical Exam: Constitutional:  BP 106/67 mmHg  Pulse 80  Ht 5' (1.524 m)  Wt 156 lb (70.761 kg)  BMI 30.47 kg/m2  General Appearance: alert, oriented, no acute distress, well nourished and casually dressed  Musculoskeletal: Strength & Muscle Tone: within normal limits Gait & Station: normal Patient leans: N/A  Mental status examination Patient is casually dressed and fairly groomed.  She maintained fair eye contact.  Her speech is slow, clear and coherent.  She described her mood tired and sad.  Her affect is constricted.  She denies any auditory or visual hallucination.  She denies any active or passive suicidal thoughts or homicidal thoughts.  There were no delusions or any paranoia.  Fund of knowledge is good.  Her psychomotor activity is slightly decreased.  There were no tremors or shakes.  Her attention and concentration is fair.  Her cognition is intact.  She is alert and oriented 3.  Her insight judgment and impulse control is okay.  Established Problem, Stable/Improving (1), Review of Psycho-Social Stressors (1), Review or order clinical lab tests (1), Review and summation of old records (2), Review of Last Therapy Session (1), Review of Medication Regimen & Side Effects (2) and Review of New Medication or Change in Dosage (2)  Assessment: Axis I: Bipolar, depressed  Axis II: Deferred  Axis III: GERD, iron deficiency anemia, back pain, Allergic rhinitis, Folliculitis, Chronic headache, Thrombocytosis, Positive H. pylori test    Axis IV: Moderate  Axis  V: 55   Plan:  I review her recent blood work, collateral information and her current medication.  She is not taking Abilify because her insurance does not cover the Abilify.  She is feeling more depressed than usual.  I recommended to try Lamictal 25 mg daily and increase 50 mg after 1 week.  Discussed medication side effects especially any rash and in that case she needed to stop the medication immediately.  Continue Celexa and Wellbutrin at present dose.  Recommended to call us back if she has any question or any concern.  I will see her again in 4 weeks. Time spent 25 minutes.  More than 50% of the time spent in psychoeducation, counseling and coordination of care.  Discuss safety plan that anytime having active suicidal thoughts or homicidal thoughts then patient need to call 911 or go to the local emergency room.   Doristine Shehan T., MD 12/22/2013

## 2013-12-22 NOTE — Telephone Encounter (Signed)
Patient and I have attempted to return each others calls multiple times.  I have left her a detailed message that I will send in a refill, but for additional refills she will need to be seen by Dr. Fuller Plan.  She is asked to call back and set up an appointment

## 2013-12-27 ENCOUNTER — Telehealth: Payer: Self-pay | Admitting: *Deleted

## 2013-12-27 NOTE — Telephone Encounter (Signed)
Left message for patient to call back. I have attempted to complete prior authorization on esomeprazole for patient. However, per [employer], they "cannot perform this prior authorization request because the requested product is a plan exlusion for this member, so there is no coverage criteria to review and apply." They will cover prilosec, protonix and dexilant. Patient needs to schedule an appointment at which time she can discuss changing medications with Dr Fuller Plan.

## 2013-12-29 MED ORDER — DEXLANSOPRAZOLE 60 MG PO CPDR: 60.0000 mg | DELAYED_RELEASE_CAPSULE | Freq: Every day | ORAL | Status: DC

## 2013-12-29 NOTE — Telephone Encounter (Signed)
Patietn states that she has tried Financial controller and protonix. She has not tried Building surveyor. Dr Fuller Plan are you okay with me switching rx to Dexilant 60 mg once daily? She has an office visit for follow up on 02/02/14 @ 3:45 pm.

## 2013-12-29 NOTE — Telephone Encounter (Signed)
Rx for Dexilant sent. Left message with Dr Lynne Leader recommendations.

## 2013-12-29 NOTE — Telephone Encounter (Signed)
Yes we can try Dexilant and antireflux measures. She has not been seen in the office since 2013 so no additional over the phone med changes for GERD until REV

## 2014-01-05 ENCOUNTER — Ambulatory Visit (INDEPENDENT_AMBULATORY_CARE_PROVIDER_SITE_OTHER): Payer: 59 | Admitting: Family Medicine

## 2014-01-05 ENCOUNTER — Encounter: Payer: Self-pay | Admitting: Family Medicine

## 2014-01-05 VITALS — BP 100/60 | HR 84 | Temp 99.4°F | Ht 60.0 in | Wt 154.0 lb

## 2014-01-05 DIAGNOSIS — R590 Localized enlarged lymph nodes: Secondary | ICD-10-CM

## 2014-01-05 DIAGNOSIS — R599 Enlarged lymph nodes, unspecified: Secondary | ICD-10-CM

## 2014-01-05 NOTE — Progress Notes (Signed)
Chief Complaint  Patient presents with  . lump    lump on her neck, left side since this past Friday 12/31/13.   She noticed a knot below her left ear 5 days ago.  It is not tender or sore.  Denies any URI symptoms, ear pain, no sore throat.  She hasn't been aware of any fevers, chills. She has been feeling more tired than usual lately, but energy has improved over the last couple of days. No sick contacts. No cheek swelling. She feels like there is a scrape in her left ear, was bleeding earlier today.  She had a flare of acne over the last week, mainly at the right temple. She has been taking ampicillin once daily (which she uses prn for acne flares). Has been improving.  PMH, PSH, SH reviewed.  She is still smoking.  ROS:  No known fever, chills, chest pain, palpitations, nausea, vomiting, diarrhea, myalgias, rashes, bleeding, bruising. Has been having abdominal bloating (unless she eats "lean and green".  PHYSICAL EXAM: BP 100/60 mmHg  Pulse 84  Temp(Src) 99.4 F (37.4 C) (Tympanic)  Ht 5' (1.524 m)  Wt 154 lb (69.854 kg)  BMI 30.08 kg/m2  LMP 12/13/2013 Pleasant, overweight female in no acute distress HEENT: PERRL, EOMI, conjunctiva and sclera clear.  Nasal mucosa is moderately edematous R>L, pale, with no purulence. L external ear, on upper portion there is a scabbed lesion (of acute onset per pt).  No soft tissue swelling or surrounding erthema.  TM's and EAc's are normal.  OP is clear. Sinuses nontender.  Neck: There is a lymph node posteriorly, below her left ear, at angle of jaw.  It is over a cm in size, and is nontender. No overlying redness.  No other lymphadenopathy noted. No thyromegaly or masses Heart: regular rate and rhythm Lungs: clear Skin: scab left upper ear as above.  Some healing papules at right temple Neuro: alert and oriented Psych: normal mood, affect, hygiene and grooming  ASSESSMENT/PLAN:  Lymphadenopathy of head and neck region - suspect reactive LN  (poss related to viral symptoms, vs lesion on ear).  no e/o bacterial infection. Discussed expected course  Lymphadenopathy--given low grade fever, recent fatigue, and scab to the left ear, this is likely an acute process, and will resolve on its own.  (viral vs related to lesion on ear).  Use topical antibacterial ointment to scabbed lesion on the left ear.  Return if having increasing pain, redness/rash, increasing size of the lymph node, or ongoing/higher fevers. Suspect that this is a reactive lymph node and will subside on its own, but might take up to a full month to completely resolve.  Don't wait that long, if getting larger, painful, or other lymph nodes develop, or any other concern.

## 2014-01-05 NOTE — Patient Instructions (Signed)
   Lymphadenopathy--given low grade fever, recent fatigue, and scab to the left ear, this is likely an acute process, and will resolve on its own.  (viral vs related to lesion on ear).  Use topical antibacterial ointment to scabbed lesion on the left ear.  Return if having increasing pain, redness/rash, increasing size of the lymph node, or ongoing/higher fevers. Suspect that this is a reactive lymph node and will subside on its own, but might take up to a full month to completely resolve.  Don't wait that long, if getting larger, painful, or other lymph nodes develop, or any other concern.  Please try and quit smoking!

## 2014-01-24 ENCOUNTER — Ambulatory Visit (INDEPENDENT_AMBULATORY_CARE_PROVIDER_SITE_OTHER): Payer: 59 | Admitting: Psychiatry

## 2014-01-24 ENCOUNTER — Encounter (HOSPITAL_COMMUNITY): Payer: Self-pay | Admitting: Psychiatry

## 2014-01-24 VITALS — BP 110/64 | HR 89 | Ht 60.0 in | Wt 156.0 lb

## 2014-01-24 DIAGNOSIS — F319 Bipolar disorder, unspecified: Secondary | ICD-10-CM

## 2014-01-24 DIAGNOSIS — F3130 Bipolar disorder, current episode depressed, mild or moderate severity, unspecified: Secondary | ICD-10-CM

## 2014-01-24 MED ORDER — LAMOTRIGINE 100 MG PO TABS: 100.0000 mg | ORAL_TABLET | Freq: Every day | ORAL | Status: DC

## 2014-01-24 NOTE — Progress Notes (Signed)
[name] [phone] Progress Note  Sarah Barry [id number] 43 y.o.  Chief Complaint:  I like Lamictal.  However I still have lack of motivation to do things.        History of Present Illness:   Sarah Barry came for her followup appointment.  She is taking Lamictal 50 mg every day.  She has no rash or itching.  Overall her mood has been better but she still feels some time tired and lack of motivation.  She has no other concern.  She is taking Celexa and Wellbutrin as prescribed.  She denies any paranoia or any hallucination.  Her energy level is seen.  She had a good Thanksgiving and she is hoping to have a Christmas with her elderly mother who is now 12 -year-old.  Patient denies any anger or any paranoia.  Her appetite is okay.  Her vitals are stable.  Patient has no children.  She lives alone however her boyfriend usually stays with her.  Patient admitted going to [place] meeting some time but she is not regular.    Suicidal Ideation: No Plan Formed: No Patient has means to carry out plan: No  Homicidal Ideation: No Plan Formed: No Patient has means to carry out plan: No  Review of Systems: Psychiatric: Agitation: No Hallucination: No Depressed Mood: No Insomnia: No Hypersomnia: No Altered Concentration: No Feels Worthless: No Grandiose Ideas: No Belief In Special Powers: No New/Increased Substance Abuse: No Compulsions: No  Neurologic: Headache: No Seizure: No Paresthesias: No  Past Medical History:  Patient has history of GERD, iron deficiency anemia, back pain, Allergic rhinitis, Folliculitis, Chronic headache, Thrombocytosis and Positive H. pylori test.     Outpatient Encounter Prescriptions as of 01/24/2014  Medication Sig  . acyclovir (ZOVIRAX) 200 MG capsule Take 1 capsule (200 mg total) by mouth every 4 (four) hours while awake.  Marland Kitchen ampicillin (PRINCIPEN) 500 MG capsule Take 500 mg by mouth daily.   Marland Kitchen buPROPion (WELLBUTRIN XL) 300 MG 24 hr tablet Take 1 tablet  (300 mg total) by mouth every morning.  . citalopram (CELEXA) 40 MG tablet Take 1 tablet (40 mg total) by mouth at bedtime.  Marland Kitchen esomeprazole (NEXIUM) 20 MG capsule Take 20 mg by mouth daily at 12 noon.  Marland Kitchen HYPERCARE 20 % external solution   . lamoTRIgine (LAMICTAL) 100 MG tablet Take 1 tablet (100 mg total) by mouth daily.  . Multiple Vitamins-Minerals (MULTIVITAMINS THER. W/MINERALS) TABS Take 1 tablet by mouth daily.    . [DISCONTINUED] lamoTRIgine (LAMICTAL) 25 MG tablet Take 1 tab daily for 1 week and than 2 daily    Past Psychiatric History/Hospitalization(s): Patient has significant history of alcohol and drug use.  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  She attended [facility] in the past .  She had tried Zoloft, Seroquel , Effexor and Lexapro in the past.  Patient has history of mania and depression .   Anxiety: No Bipolar Disorder: Yes Depression: Yes Mania: Yes Psychosis: No Schizophrenia: No Personality Disorder: No Hospitalization for psychiatric illness: Yes History of Electroconvulsive Shock Therapy: No Prior Suicide Attempts: No  Recent Results (from the past 2160 hour(s))  Hemoglobin-hemacue, POC     Status: Abnormal   Collection Time: 12/01/13  1:55 PM  Result Value Ref Range   Hemoglobin 11.5 (L) 12.0 - 15.0 g/dL  Pregnancy, urine POC     Status: None   Collection Time: 12/01/13  1:56 PM  Result Value Ref Range  Preg Test, Ur NEGATIVE NEGATIVE    Comment:        THE SENSITIVITY OF THIS METHODOLOGY IS >24 mIU/mL   Physical Exam: Constitutional:  BP 110/64 mmHg  Pulse 89  Ht 5' (1.524 m)  Wt 156 lb (70.761 kg)  BMI 30.47 kg/m2  LMP 12/13/2013  General Appearance: alert, oriented, no acute distress, well nourished and casually dressed  Musculoskeletal: Strength & Muscle Tone: within normal limits Gait & Station: normal Patient leans: N/A  Mental status examination Patient is casually dressed and fairly groomed.  She maintained fair eye contact.   Her speech is slow, clear and coherent.  She described her mood tired and her affect is appropriate.  She denies any auditory or visual hallucination.  She denies any active or passive suicidal thoughts or homicidal thoughts.  There were no delusions or any paranoia. Her fund of knowledge is good.  Her psychomotor activity is slightly decreased.  There were no tremors or shakes.  Her attention and concentration is fair.  Her cognition is intact.  She is alert and oriented 3.  Her insight judgment and impulse control is okay.  Established Problem, Stable/Improving (1), Review of Last Therapy Session (1), Review of Medication Regimen & Side Effects (2) and Review of New Medication or Change in Dosage (2)  Assessment: Axis I: Bipolar, depressed  Axis II: Deferred  Axis III: GERD, iron deficiency anemia, back pain, Allergic rhinitis, Folliculitis, Chronic headache, Thrombocytosis, Positive H. pylori test    Axis IV: Moderate  Axis V: 55   Plan:  Patient is doing better from the past.  I would increase Lamictal 100 mg daily and continue Celexa 40 mg daily and Wellbutrin XL 300 mg daily.  She does not have any side effects of medication.  Recommended to call us back if she has any question or any concern.  I will see her again in 2 months.    ARFEEN,SYED T., MD 01/24/2014

## 2014-01-27 ENCOUNTER — Telehealth (HOSPITAL_COMMUNITY): Payer: Self-pay

## 2014-01-27 ENCOUNTER — Other Ambulatory Visit (HOSPITAL_COMMUNITY): Payer: Self-pay | Admitting: Psychiatry

## 2014-01-28 ENCOUNTER — Other Ambulatory Visit (HOSPITAL_COMMUNITY): Payer: Self-pay | Admitting: Psychiatry

## 2014-01-28 NOTE — Telephone Encounter (Signed)
New script give with new dose and direction.

## 2014-02-02 ENCOUNTER — Ambulatory Visit: Payer: Self-pay | Admitting: Gastroenterology

## 2014-03-02 ENCOUNTER — Ambulatory Visit: Payer: Self-pay | Admitting: Gastroenterology

## 2014-03-07 ENCOUNTER — Ambulatory Visit (INDEPENDENT_AMBULATORY_CARE_PROVIDER_SITE_OTHER): Payer: 59 | Admitting: Psychiatry

## 2014-03-07 ENCOUNTER — Encounter (HOSPITAL_COMMUNITY): Payer: Self-pay | Admitting: Psychiatry

## 2014-03-07 VITALS — BP 100/70 | HR 85 | Ht 60.0 in | Wt 155.4 lb

## 2014-03-07 DIAGNOSIS — F319 Bipolar disorder, unspecified: Secondary | ICD-10-CM

## 2014-03-07 DIAGNOSIS — F3130 Bipolar disorder, current episode depressed, mild or moderate severity, unspecified: Secondary | ICD-10-CM

## 2014-03-07 MED ORDER — LAMOTRIGINE 100 MG PO TABS: 100.0000 mg | ORAL_TABLET | Freq: Every day | ORAL | Status: DC

## 2014-03-07 MED ORDER — BUPROPION HCL ER (XL) 300 MG PO TB24: 300.0000 mg | ORAL_TABLET | ORAL | Status: DC

## 2014-03-07 MED ORDER — CITALOPRAM HYDROBROMIDE 40 MG PO TABS: 40.0000 mg | ORAL_TABLET | Freq: Every day | ORAL | Status: DC

## 2014-03-07 NOTE — Progress Notes (Signed)
[place] [phone] Progress Note  Sarah Barry [id number] 44 y.o.  Chief Complaint:    I'm doing very well.  I stop smoking.         History of Present Illness:   Sarah Barry came for Sarah Barry followup appointment.   She is doing much better since Lamictal dose increased.  She has no rash or itching.  She has increased energy level.  She is happy that she quit smoking.  Sarah Barry job is going very well.  She has no rash, itching, tremors or any shakes.  She is compliant with Wellbutrin and Celexa.  Sarah Barry appetite is okay.  She continues to visit Sarah Barry mother who is 44 -year-old.  Patient denies any anger or any paranoia.  Sarah Barry appetite is okay.  Sarah Barry vitals are stable.  Patient has no children.  She lives alone however Sarah Barry boyfriend usually stays with Sarah Barry.    Suicidal Ideation: No Plan Formed: No Patient has means to carry out plan: No  Homicidal Ideation: No Plan Formed: No Patient has means to carry out plan: No  Review of Systems: Psychiatric: Agitation: No Hallucination: No Depressed Mood: No Insomnia: No Hypersomnia: No Altered Concentration: No Feels Worthless: No Grandiose Ideas: No Belief In Special Powers: No New/Increased Substance Abuse: No Compulsions: No  Neurologic: Headache: No Seizure: No Paresthesias: No  Past Medical History:  Patient has history of GERD, iron deficiency anemia, back pain, Allergic rhinitis, Folliculitis, Chronic headache, Thrombocytosis and Positive H. pylori test.     Outpatient Encounter Prescriptions as of 03/07/2014  Medication Sig  . acyclovir (ZOVIRAX) 200 MG capsule Take 1 capsule (200 mg total) by mouth every 4 (four) hours while awake.  Marland Kitchen ampicillin (PRINCIPEN) 500 MG capsule Take 500 mg by mouth daily.   Marland Kitchen buPROPion (WELLBUTRIN XL) 300 MG 24 hr tablet Take 1 tablet (300 mg total) by mouth every morning.  . citalopram (CELEXA) 40 MG tablet Take 1 tablet (40 mg total) by mouth at bedtime.  Marland Kitchen esomeprazole (NEXIUM) 20 MG capsule Take 20 mg  by mouth daily at 12 noon.  Marland Kitchen HYPERCARE 20 % external solution   . lamoTRIgine (LAMICTAL) 100 MG tablet Take 1 tablet (100 mg total) by mouth daily.  . Multiple Vitamins-Minerals (MULTIVITAMINS THER. W/MINERALS) TABS Take 1 tablet by mouth daily.    . [DISCONTINUED] buPROPion (WELLBUTRIN XL) 300 MG 24 hr tablet Take 1 tablet (300 mg total) by mouth every morning.  . [DISCONTINUED] citalopram (CELEXA) 40 MG tablet Take 1 tablet (40 mg total) by mouth at bedtime.  . [DISCONTINUED] lamoTRIgine (LAMICTAL) 100 MG tablet Take 1 tablet (100 mg total) by mouth daily.    Past Psychiatric History/Hospitalization(s): Patient has significant history of alcohol and drug use.  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  She attended [facility] in [facility] past .  She had tried Zoloft, Seroquel , Effexor and Lexapro in the past.  Patient has history of mania and depression .   Anxiety: No Bipolar Disorder: Yes Depression: Yes Mania: Yes Psychosis: No Schizophrenia: No Personality Disorder: No Hospitalization for psychiatric illness: Yes History of Electroconvulsive Shock Therapy: No Prior Suicide Attempts: No  No results found for this or any previous visit (from the past 2160 hour(s)). Physical Exam: Constitutional:  BP 100/70 mmHg  Pulse 85  Ht 5' (1.524 m)  Wt 155 lb 6.4 oz (70.489 kg)  BMI 30.35 kg/m2  General Appearance: alert, oriented, no acute distress, well nourished and casually dressed  Musculoskeletal:  Strength & Muscle Tone: within normal limits Gait & Station: normal Patient leans: N/A  Mental status examination Patient is casually dressed and fairly groomed.  She maintained fair eye contact.  Sarah Barry speech is slow, clear and coherent.  She denies any auditory or visual hallucination.  She denies any active or passive suicidal thoughts or homicidal thoughts.  There were no delusions or any paranoia. Sarah Barry fund of knowledge is good.  Sarah Barry psychomotor activity is slightly decreased.  There  were no tremors or shakes.  Sarah Barry attention and concentration is fair.  Sarah Barry cognition is intact.  She is alert and oriented 3.  Sarah Barry insight judgment and impulse control is okay.  Established Problem, Stable/Improving (1), Review of Last Therapy Session (1) and Review of Medication Regimen & Side Effects (2)  Assessment: Axis I: Bipolar, depressed  Axis II: Deferred  Axis III: GERD, iron deficiency anemia, back pain, Allergic rhinitis, Folliculitis, Chronic headache, Thrombocytosis, Positive H. pylori test    Axis IV: Moderate  Axis V: 55   Plan:  Patient is doing better from the past.   Continue Lamictal 100 mg daily, Celexa 40 mg daily and Wellbutrin XL 300 mg daily.  She does not have any side effects of medication.  Recommended to call us back if she has any question or any concern.  I will see Sarah Barry again in 3 months.    ARFEEN,SYED T., MD 03/07/2014

## 2014-04-18 ENCOUNTER — Telehealth (HOSPITAL_COMMUNITY): Payer: Self-pay | Admitting: *Deleted

## 2014-04-18 NOTE — Telephone Encounter (Signed)
Dr. Adele Schilder,   Patient had [facility] send [place] a request via fax for a 90 day supply on her medication Lamotrigine and Citalopram. Do you want to switch to 90 day supply?   Thank you

## 2014-04-19 ENCOUNTER — Other Ambulatory Visit (HOSPITAL_COMMUNITY): Payer: Self-pay | Admitting: Psychiatry

## 2014-04-19 NOTE — Telephone Encounter (Signed)
We'll switch her 90 day supply on her next visit.

## 2014-05-03 ENCOUNTER — Other Ambulatory Visit (HOSPITAL_COMMUNITY): Payer: Self-pay | Admitting: Psychiatry

## 2014-05-04 ENCOUNTER — Other Ambulatory Visit (HOSPITAL_COMMUNITY): Payer: Self-pay | Admitting: Psychiatry

## 2014-05-04 NOTE — Telephone Encounter (Signed)
Given in January for 90 days supply. Too soon to refill.

## 2014-05-05 ENCOUNTER — Telehealth (HOSPITAL_COMMUNITY): Payer: Self-pay

## 2014-05-05 NOTE — Telephone Encounter (Signed)
Telephone call with pharmacist at [facility] after receiving a request for a Wellbutrin new order.  Pharmacist stated fax was sent by mistake and that they had an order on file.

## 2014-05-10 DIAGNOSIS — H04563 Stenosis of bilateral lacrimal punctum: Secondary | ICD-10-CM | POA: Insufficient documentation

## 2014-05-10 DIAGNOSIS — Q134 Other congenital corneal malformations: Secondary | ICD-10-CM | POA: Insufficient documentation

## 2014-05-10 DIAGNOSIS — H04202 Unspecified epiphora, left lacrimal gland: Secondary | ICD-10-CM | POA: Insufficient documentation

## 2014-05-30 ENCOUNTER — Ambulatory Visit
Admission: RE | Admit: 2014-05-30 | Discharge: 2014-05-30 | Disposition: A | Discharge status: Home / Self Care | Payer: 59 | Source: Ambulatory Visit | Attending: Family Medicine | Admitting: Family Medicine

## 2014-05-30 ENCOUNTER — Other Ambulatory Visit: Payer: Self-pay | Admitting: Family Medicine

## 2014-05-30 DIAGNOSIS — M542 Cervicalgia: Secondary | ICD-10-CM

## 2014-06-05 ENCOUNTER — Encounter: Payer: Self-pay | Admitting: Certified Nurse Midwife

## 2014-06-06 ENCOUNTER — Ambulatory Visit (HOSPITAL_COMMUNITY): Payer: Self-pay | Admitting: Psychiatry

## 2014-06-06 ENCOUNTER — Telehealth: Payer: Self-pay | Admitting: Emergency Medicine

## 2014-06-06 NOTE — Telephone Encounter (Signed)
Spoke with patient. She states she has had 6 months of abdominal bloating with lower back pain. No irregular cycles. Has completed all follow up with Dr. Kerin Perna.   Patient states she feels that she has symptoms of ovarian cancer and would like evaluation.  Appointment scheduled for 06/08/14 with Regina Eck CNM.  Routing to provider for final review. Patient agreeable to disposition. Will close encounter

## 2014-06-06 NOTE — Telephone Encounter (Signed)
Message left to return call to [place] at [phone].   Last pap smear 05/19/13-Normal.

## 2014-06-06 NOTE — Telephone Encounter (Signed)
Spoke with patient. She states she has had 6 months of abdominal bloating with lower back pain. No irregular cycles. Has completed all follow up with Dr. Kerin Perna.   Patient states she feels that she has symptoms of ovarian cancer and would like evaluation.  Appointment scheduled for 06/08/14 with Regina Eck CNM.

## 2014-06-06 NOTE — Telephone Encounter (Signed)
Delivery   Non-Urgent Medical Question  06/05/2014 5:50 PM Reply  To: [facility]   From: Creta Levin   Created: 06/05/2014 5:50 PM    *-*-*This message has not been handled.*-*-*   HI,  I wanted to know when the last time I actually had a pap smear. Was it during the time of checking on the fibroid etc. or was it last years annual? I have symptoms of ovarian cancer and wanted to come in and have it checked.  Thank you

## 2014-06-06 NOTE — Telephone Encounter (Signed)
Returned [employer] to patient.  Message left to return call to [facility] at [phone] for triage and appointment.

## 2014-06-07 ENCOUNTER — Other Ambulatory Visit: Payer: Self-pay | Admitting: Family Medicine

## 2014-06-07 ENCOUNTER — Ambulatory Visit
Admission: RE | Admit: 2014-06-07 | Discharge: 2014-06-07 | Disposition: A | Discharge status: Home / Self Care | Payer: 59 | Source: Ambulatory Visit | Attending: Family Medicine | Admitting: Family Medicine

## 2014-06-07 DIAGNOSIS — M545 Low back pain, unspecified: Secondary | ICD-10-CM

## 2014-06-07 DIAGNOSIS — G8929 Other chronic pain: Secondary | ICD-10-CM

## 2014-06-08 ENCOUNTER — Ambulatory Visit: Payer: Self-pay | Admitting: Certified Nurse Midwife

## 2014-06-09 ENCOUNTER — Ambulatory Visit (HOSPITAL_COMMUNITY): Payer: Self-pay | Admitting: Psychiatry

## 2014-06-12 ENCOUNTER — Other Ambulatory Visit (HOSPITAL_COMMUNITY): Payer: Self-pay | Admitting: Psychiatry

## 2014-06-13 ENCOUNTER — Other Ambulatory Visit (HOSPITAL_COMMUNITY): Payer: Self-pay | Admitting: Psychiatry

## 2014-06-14 ENCOUNTER — Encounter: Payer: Self-pay | Admitting: Certified Nurse Midwife

## 2014-06-14 ENCOUNTER — Ambulatory Visit (INDEPENDENT_AMBULATORY_CARE_PROVIDER_SITE_OTHER): Payer: 59 | Admitting: Certified Nurse Midwife

## 2014-06-14 VITALS — BP 100/62 | HR 72 | Resp 20 | Ht 60.0 in | Wt 153.0 lb

## 2014-06-14 DIAGNOSIS — A630 Anogenital (venereal) warts: Secondary | ICD-10-CM | POA: Diagnosis not present

## 2014-06-14 NOTE — Patient Instructions (Signed)
Genital Warts Genital warts are a sexually transmitted infection. They may appear as small bumps on the tissues of the genital area. CAUSES  Genital warts are caused by a virus called human papillomavirus (HPV). HPV is the most common sexually transmitted disease (STD) and infection of the sex organs. This infection is spread by having unprotected sex with an infected person. It can be spread by vaginal, anal, and oral sex. Many people do not know they are infected. They may be infected for years without problems. However, even if they do not have problems, they can unknowingly pass the infection to their sexual partners. SYMPTOMS   Itching and irritation in the genital area.  Warts that bleed.  Painful sexual intercourse. DIAGNOSIS  Warts are usually recognized with the naked eye on the vagina, vulva, perineum, anus, and rectum. Certain tests can also diagnose genital warts, such as:  A Pap test.  A tissue sample (biopsy) exam.  Colposcopy. A magnifying tool is used to examine the vagina and cervix. The HPV cells will change color when certain solutions are used. TREATMENT  Warts can be removed by:  Applying certain chemicals, such as cantharidin or podophyllin.  Liquid nitrogen freezing (cryotherapy).  Immunotherapy with Candida or Trichophyton injections.  Laser treatment.  Burning with an electrified probe (electrocautery).  Interferon injections.  Surgery. PREVENTION  HPV vaccination can help prevent HPV infections that cause genital warts and that cause cancer of the cervix. It is recommended that the vaccination be given to people between the ages 9 to 26 years old. The vaccine might not work as well or might not work at all if you already have HPV. It should not be given to pregnant women. HOME CARE INSTRUCTIONS   It is important to follow your caregiver's instructions. The warts will not go away without treatment. Repeat treatments are often needed to get rid of warts.  Even after it appears that the warts are gone, the normal tissue underneath often remains infected.  Do not try to treat genital warts with medicine used to treat hand warts. This type of medicine is strong and can burn the skin in the genital area, causing more damage.  Tell your past and current sexual partner(s) that you have genital warts. They may be infected also and need treatment.  Avoid sexual contact while being treated.  Do not touch or scratch the warts. The infection may spread to other parts of your body.  Women with genital warts should have a cervical cancer check (Pap test) at least once a year. This type of cancer is slow-growing and can be cured if found early. Chances of developing cervical cancer are increased with HPV.  Inform your obstetrician about your warts in the event of pregnancy. This virus can be passed to the baby's respiratory tract. Discuss this with your caregiver.  Use a condom during sexual intercourse. Following treatment, the use of condoms will help prevent reinfection.  Ask your caregiver about using over-the-counter anti-itch creams. SEEK MEDICAL CARE IF:   Your treated skin becomes red, swollen, or painful.  You have a fever.  You feel generally ill.  You feel little lumps in and around your genital area.  You are bleeding or have painful sexual intercourse. MAKE SURE YOU:   Understand these instructions.  Will watch your condition.  Will get help right away if you are not doing well or get worse. Document Released: 02/02/2000 Document Revised: 06/21/2013 Document Reviewed: 08/13/2010 ExitCare Patient Information 2015 [employer]. This   information is not intended to replace advice given to you by your health care provider. Make sure you discuss any questions you have with your health care provider.  

## 2014-06-14 NOTE — Progress Notes (Signed)
Reviewed personally.  M. Suzanne Siddalee Vanderheiden, MD.  

## 2014-06-14 NOTE — Progress Notes (Signed)
44 y.o.Single g0p0 here with questions about cervical cancer and would like ? Warts treated in vaginal area if present. Patient treated in past with TCA with good results. Aex due and plans to schedule today. Had previous questions about bloating when made appointment, but under evaluation for GI issues. No other health concerns today. Declines pelvic exam, just treatment of warts if possible.   O:Healthy female WDWN Affect: normal, orientation x 3  Exam:  Lymph node: no enlargement or tenderness Pelvic exam: External genital: normal female, with 2 genital warts noted on left labia at 2 and 5 o'clock and one noted at 7 o'clock on right perineal area. BUS: negative   A:History of genital warts with previous treatment several years ago requesting TCA treatment. Discussed pap smear screening protocol  and HPVHR screening every 5 years. Pap smear should be done every other year with HPV reflex on other screen. Discussed if appearance of cervix or vagina is noted pap smear, may be done outside of this routine. Questions addressed. Feels more comfortable with screening changes now.  P: Discussed findings of genital warts. Discussed risks and benefits of treatment with TCA and expectations of treatment. Patient requests TCA treatment.  Procedure: Vaseline applied to healthy skin in all three areas of genital warts. TCA applied with acetowhite response noted. Patient tolerated procedure well. Instructions given. Will recheck with aex in 2 to 3 weeks.  Rv prn   Rv prn

## 2014-06-16 ENCOUNTER — Encounter (HOSPITAL_COMMUNITY): Payer: Self-pay | Admitting: Psychiatry

## 2014-06-16 ENCOUNTER — Ambulatory Visit (INDEPENDENT_AMBULATORY_CARE_PROVIDER_SITE_OTHER): Payer: 59 | Admitting: Psychiatry

## 2014-06-16 VITALS — BP 134/68 | HR 72 | Ht 60.0 in | Wt 153.0 lb

## 2014-06-16 DIAGNOSIS — F3130 Bipolar disorder, current episode depressed, mild or moderate severity, unspecified: Secondary | ICD-10-CM | POA: Diagnosis not present

## 2014-06-16 DIAGNOSIS — F319 Bipolar disorder, unspecified: Secondary | ICD-10-CM

## 2014-06-16 MED ORDER — LAMOTRIGINE 100 MG PO TABS: 100.0000 mg | ORAL_TABLET | Freq: Every day | ORAL | Status: DC

## 2014-06-16 MED ORDER — BUPROPION HCL ER (XL) 300 MG PO TB24: 300.0000 mg | ORAL_TABLET | ORAL | Status: DC

## 2014-06-16 MED ORDER — CITALOPRAM HYDROBROMIDE 40 MG PO TABS: 40.0000 mg | ORAL_TABLET | Freq: Every day | ORAL | Status: DC

## 2014-06-16 NOTE — Progress Notes (Signed)
Sarah Barry [phone] Progress Note  [name] [id number] 44 y.o.  Chief Complaint:  Medication management and follow-up.        History of Present Illness:   Sarah Barry came for her followup appointment.  She is doing better on her current medication.  She denies any irritability, anger, mood swing.  She is happy that her job is going very well.  She mentioned her boyfriend stop drinking and there has been an huge improvement in his behavior and things are going very well.  Patient denies any irritability, anger, crying spells.  She sleeping good.  She has no rash or itching.  Her energy level is good.  She denies any feeling of hopelessness or worthlessness.  She is excited because her grandmother celebrated her 13 birthday at her house.  She mentioned her grandmother has a blast with cookout.  Patient has no issues with the medication.  Her appetite is okay.  Her vitals are stable.  Patient has no children.  She lives with her boyfriend.  Suicidal Ideation: No Plan Formed: No Patient has means to carry out plan: No  Homicidal Ideation: No Plan Formed: No Patient has means to carry out plan: No  Review of Systems: Psychiatric: Agitation: No Hallucination: No Depressed Mood: No Insomnia: No Hypersomnia: No Altered Concentration: No Feels Worthless: No Grandiose Ideas: No Belief In Special Powers: No New/Increased Substance Abuse: No Compulsions: No  Neurologic: Headache: No Seizure: No Paresthesias: No  Past Medical History:  Patient has history of GERD, iron deficiency anemia, back pain, Allergic rhinitis, Folliculitis, Chronic headache, Thrombocytosis and Positive H. pylori test.     Outpatient Encounter Prescriptions as of 06/16/2014  Medication Sig  . acyclovir (ZOVIRAX) 200 MG capsule Take 1 capsule (200 mg total) by mouth every 4 (four) hours while awake.  Marland Kitchen buPROPion (WELLBUTRIN XL) 300 MG 24 hr tablet Take 1 tablet (300 mg total) by mouth every morning.  .  citalopram (CELEXA) 40 MG tablet Take 1 tablet (40 mg total) by mouth at bedtime.  Marland Kitchen esomeprazole (NEXIUM) 20 MG capsule Take 20 mg by mouth daily at 12 noon.  Marland Kitchen HYPERCARE 20 % external solution   . lamoTRIgine (LAMICTAL) 100 MG tablet Take 1 tablet (100 mg total) by mouth daily.  . Multiple Vitamins-Minerals (MULTIVITAMINS THER. W/MINERALS) TABS Take 1 tablet by mouth daily.    . [DISCONTINUED] buPROPion (WELLBUTRIN XL) 300 MG 24 hr tablet Take 1 tablet (300 mg total) by mouth every morning.  . [DISCONTINUED] citalopram (CELEXA) 40 MG tablet Take 1 tablet (40 mg total) by mouth at bedtime.  . [DISCONTINUED] lamoTRIgine (LAMICTAL) 100 MG tablet Take 1 tablet (100 mg total) by mouth daily.    Past Psychiatric History/Hospitalization(s): Patient has significant history of alcohol and drug use.  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  She attended [facility] in the past .  She had tried Zoloft, Seroquel , Effexor and Lexapro in the past.  Patient has history of mania and depression .   Anxiety: No Bipolar Disorder: Yes Depression: Yes Mania: Yes Psychosis: No Schizophrenia: No Personality Disorder: No Hospitalization for psychiatric illness: Yes History of Electroconvulsive Shock Therapy: No Prior Suicide Attempts: No  No results found for this or any previous visit (from the past 2160 hour(s)). Physical Exam: Constitutional:  BP 134/68 mmHg  Pulse 72  Ht 5' (1.524 m)  Wt 153 lb (69.4 kg)  BMI 29.88 kg/m2  LMP 06/06/2014  General Appearance: alert, oriented, no  acute distress, well nourished and casually dressed  Musculoskeletal: Strength & Muscle Tone: within normal limits Gait & Station: normal Patient leans: N/A  Mental status examination Patient is casually dressed and fairly groomed.  She maintained good eye contact.  Her speech is slow, clear and coherent.  She denies any auditory or visual hallucination.  She denies any active or passive suicidal thoughts or  homicidal thoughts.  There were no delusions or any paranoia. Her fund of knowledge is good.  Her psychomotor activity is slightly decreased.  There were no tremors or shakes.  Her attention and concentration is fair.  Her cognition is intact.  She is alert and oriented 3.  Her insight judgment and impulse control is okay.  Established Problem, Stable/Improving (1), Review of Last Therapy Session (1) and Review of Medication Regimen & Side Effects (2)  Assessment: Axis I: Bipolar, depressed  Axis II: Deferred  Axis III: GERD, iron deficiency anemia, back pain, Allergic rhinitis, Folliculitis, Chronic headache, Thrombocytosis, Positive H. pylori test    Plan:  Patient is stable on her medication.  She has no rash or itching.  Continue Lamictal 100 mg daily, Celexa 40 mg daily and Wellbutrin XL 300 mg daily.  She does not have any side effects of medication.  Recommended to call us back if she has any question or any concern.  Follow-up in 3 months.     Racer Quam T., MD 06/16/2014

## 2014-06-27 ENCOUNTER — Encounter: Payer: Self-pay | Admitting: Certified Nurse Midwife

## 2014-06-27 ENCOUNTER — Ambulatory Visit (INDEPENDENT_AMBULATORY_CARE_PROVIDER_SITE_OTHER): Payer: 59 | Admitting: Certified Nurse Midwife

## 2014-06-27 VITALS — BP 112/66 | HR 88 | Resp 18 | Ht 60.0 in | Wt 154.0 lb

## 2014-06-27 DIAGNOSIS — Z0000 Encounter for general adult medical examination without abnormal findings: Secondary | ICD-10-CM

## 2014-06-27 DIAGNOSIS — Z01419 Encounter for gynecological examination (general) (routine) without abnormal findings: Secondary | ICD-10-CM | POA: Diagnosis not present

## 2014-06-27 DIAGNOSIS — Z124 Encounter for screening for malignant neoplasm of cervix: Secondary | ICD-10-CM

## 2014-06-27 DIAGNOSIS — A630 Anogenital (venereal) warts: Secondary | ICD-10-CM

## 2014-06-27 LAB — POCT URINALYSIS DIPSTICK
LEUKOCYTES UA: NEGATIVE
UROBILINOGEN UA: NEGATIVE
pH, UA: 5

## 2014-06-27 NOTE — Progress Notes (Signed)
44 y.o. G0P0000 Single  Caucasian Fe here for annual exam. Periods normal, no issues. Occasional bleeding x 1 day midcycle only. Sees PCP Dr Mina Marble yearly and for labs. Dr. Adele Schilder manages  Wellbutrin and Celexa and Lamictal. Treated with TCA for genital warts 2 weeks ago here and feels all are resolved, except one. Desires   Treatment if indicated. No other health issues today.   Patient's last menstrual period was 06/16/2014.          Sexually active: Yes.    The current method of family planning is none.  History of infertility.  Exercising: No.  The patient does not participate in regular exercise at present. Smoker:  no  Health Maintenance: Pap:  05/19/13 wnl NEG HR HPV MMG:  07/23/13 breast category c ; bi-rads 1 3 D mammogram recommended yearly Colonoscopy:  04/21/2012 benign polyps f/u in 10 years BMD:   Never had one TDaP:  01/23/2012 Labs: PCP; Urine: Negative    reports that she has quit smoking. Her smoking use included Cigarettes. She has a 13 pack-year smoking history. She has never used smokeless tobacco. She reports that she does not drink alcohol or use illicit drugs.  Past Medical History  Diagnosis Date  . GERD (gastroesophageal reflux disease)   . Allergic rhinitis   . Folliculitis   . Chronic headache     during cycle  . Thrombocytosis   . Bipolar disorder   . Positive H. pylori test   . Depression   . Fatigue   . Tongue disorder   . Anemia   . Cancer     skin ca - buttocks  . Colon polyp 04/2012    tubular adenoma  . Fibroid   . HSV infection   . Dysmenorrhea 05/19/2013    Past Surgical History  Procedure Laterality Date  . Skin cancer excision      buttock area, Dr. Marlou Starks  . Colonoscopy  04/2012    tubular adenoma  . Colposcopy  2014  . Cholecystectomy  2000  . Myomectomy N/A 12/01/2013    Procedure: LAPAROSCOPY/GELPORT ASSISTED MYOMECTOMY/ADENOMYOMECTOMY;  Surgeon: Governor Specking, MD;  Location: [facility];  Service: Gynecology;   Laterality: N/A;  . Hysteroscopy w/d&c N/A 12/01/2013    Procedure: INSERTION OF SEPRAFILM;  Surgeon: Governor Specking, MD;  Location: [facility];  Service: Gynecology;  Laterality: N/A;    Current Outpatient Prescriptions  Medication Sig Dispense Refill  . acyclovir (ZOVIRAX) 200 MG capsule Take 1 capsule (200 mg total) by mouth every 4 (four) hours while awake. (Patient taking differently: Take 200 mg by mouth as needed. ) 90 capsule 3  . buPROPion (WELLBUTRIN XL) 300 MG 24 hr tablet Take 1 tablet (300 mg total) by mouth every morning. 90 tablet 0  . citalopram (CELEXA) 40 MG tablet Take 1 tablet (40 mg total) by mouth at bedtime. 90 tablet 0  . esomeprazole (NEXIUM) 20 MG capsule Take 20 mg by mouth daily at 12 noon.    Marland Kitchen HYPERCARE 20 % external solution     . lamoTRIgine (LAMICTAL) 100 MG tablet Take 1 tablet (100 mg total) by mouth daily. 90 tablet 0  . Multiple Vitamins-Minerals (MULTIVITAMINS THER. W/MINERALS) TABS Take 1 tablet by mouth daily.      . [DISCONTINUED] QUEtiapine (SEROQUEL) 100 MG tablet Take 150 mg by mouth at bedtime.       No current facility-administered medications for this visit.    Family History  Problem Relation Age of  Onset  . Colon cancer Maternal Grandfather   . Cancer Maternal Grandfather     colon cancer  . Depression Maternal Grandfather   . Diabetes Paternal Grandmother   . Alcohol abuse Father   . Physical abuse Father   . Heart disease Father 79    MI  . Hyperlipidemia Father   . Hypertension Father   . Depression Father   . Esophageal cancer Neg Hx   . Rectal cancer Neg Hx   . Stomach cancer Neg Hx   . Osteoporosis Maternal Grandmother     ROS:  Pertinent items are noted in HPI.  Otherwise, a comprehensive ROS was negative.  Exam:   BP 112/66 mmHg  Pulse 88  Resp 18  Ht 5' (1.524 m)  Wt 154 lb (69.854 kg)  BMI 30.08 kg/m2  LMP 06/16/2014 Height: 5' (152.4 cm) Ht Readings from Last 3 Encounters:  06/27/14 5'  (1.524 m)  06/16/14 5' (1.524 m)  06/14/14 5' (1.524 m)    General appearance: alert, cooperative and appears stated age Head: Normocephalic, without obvious abnormality, atraumatic Neck: no adenopathy, supple, symmetrical, trachea midline and thyroid normal to inspection and palpation Lungs: clear to auscultation bilaterally Breasts: normal appearance, no masses or tenderness, No nipple retraction or dimpling, No nipple discharge or bleeding, No axillary or supraclavicular adenopathy Heart: regular rate and rhythm Abdomen: soft, non-tender; no masses,  no organomegaly Extremities: extremities normal, atraumatic, no cyanosis or edema Skin: Skin color, texture, turgor normal. No rashes or lesions Lymph nodes: Cervical, supraclavicular, and axillary nodes normal. No abnormal inguinal nodes palpated Neurologic: Grossly normal   Pelvic: External genitalia:  Normal female, 2 condyloma previously treated resolved, one condyloma noted at bottom of vulva on left.              Urethra:  normal appearing urethra with no masses, tenderness or lesions              Bartholin's and Skene's: normal                 Vagina: normal appearing vagina with normal color and discharge, no lesions              Cervix: normal appearance, non tender, non lesions              Pap taken: Yes.   Bimanual Exam:  Uterus:  normal size, contour, position, consistency, mobility, non-tender history of fibroid non tender,              Adnexa: normal adnexa and no mass, fullness, tenderness               Rectovaginal: Confirms               Anus:  normal sphincter tone, no lesions  Chaperone present: Yes  A:  Well Woman with normal exam   Contraception none, history of infertility  Myomectomy in 2015 to improve bleeding profile, which has improved  Bipolar with stable medication with MD management  Condyloma resolved except for one in  Perineal area desires treatment with TCA   P:   Reviewed health and wellness  pertinent to exam  May plan to proceed with evaluation for infertility, undecided, aware of age factor, will advise  Continue MD management as recommended  Discussed risks/benefits of TCA treatment for condyloma, desires same.  Pap smear taken today with HPV reflex   counseled on breast self exam, mammography screening, adequate intake of calcium and vitamin D, diet  and exercise  Procedure: Vaseline applied to healthy tissue around condyloma, TCA applied with acetowhite effect noted. Patient tolerated procedure well. Instructions given. Recheck in 2 weeks if not resolved. Patient will advise.  return annually or prn  An After Visit Summary was printed and given to the patient.

## 2014-06-27 NOTE — Patient Instructions (Signed)

## 2014-06-28 ENCOUNTER — Encounter: Payer: Self-pay | Admitting: Certified Nurse Midwife

## 2014-06-29 LAB — IPS PAP TEST WITH REFLEX TO HPV

## 2014-07-03 NOTE — Progress Notes (Signed)
Reviewed personally.  M. Suzanne Dequandre Cordova, MD.  

## 2014-07-06 ENCOUNTER — Telehealth (HOSPITAL_COMMUNITY): Payer: Self-pay | Admitting: *Deleted

## 2014-07-06 ENCOUNTER — Telehealth (HOSPITAL_COMMUNITY): Payer: Self-pay

## 2014-07-06 NOTE — Telephone Encounter (Signed)
Received fax from [facility], patient request transfer of pharmacy.  Patient had a RX filled for 90 days at [facility] on 06-16-14. Called patient to see if she has enough medication and if this change in pharmacy is for [place] to know on next visit.  Waiting on call back.  Pharmacy changed to [facility].

## 2014-07-06 NOTE — Telephone Encounter (Signed)
Telephone call message left for patient after she called back and left a message she did need refills of medications.  On message informed patient she had new 90 day orders for Wellbutrin, Celexa and Lamictal sent to [facility] on [address] on 06/16/14.   Informed it was too early for patient to get new 90 day orders and requested patient to call back if she had not gotten refills from 06/16/14 or was having problems getting refills.  Requested patient call back to discuss what is needed.

## 2014-07-08 ENCOUNTER — Telehealth (HOSPITAL_COMMUNITY): Payer: Self-pay

## 2014-07-08 DIAGNOSIS — F319 Bipolar disorder, unspecified: Secondary | ICD-10-CM

## 2014-07-08 MED ORDER — BUPROPION HCL ER (XL) 300 MG PO TB24: 300.0000 mg | ORAL_TABLET | ORAL | Status: DC

## 2014-07-08 MED ORDER — CITALOPRAM HYDROBROMIDE 40 MG PO TABS: 40.0000 mg | ORAL_TABLET | Freq: Every day | ORAL | Status: DC

## 2014-07-08 MED ORDER — LAMOTRIGINE 100 MG PO TABS: 100.0000 mg | ORAL_TABLET | Freq: Every day | ORAL | Status: DC

## 2014-07-08 NOTE — Telephone Encounter (Signed)
Met with Dr. Adele Schilder who authorized 90 day new orders of patient's medications to be sent to [facility] for patient's Lamictal, Wellbutrin and Citalopram once we verified with [facility] on [facility] they only could fill 30 day supply of medications at a time.  Lynnae Sandhoff, pharmacist at [facility] who did report patient's insurance would only allow 30 day prescriptions of patient's Wellbutrin, Lamictal and Celexa.  Pharmacist reported they filled all 3 one time with 30 day supplies on 06/16/14 that were picked up on 06/20/14.  Requested remaining orders be discarded per Dr. Adele Schilder and informed would be sending in new 90 day orders to [facility] today for all 3 prescriptions and pharmacist agreed.  New 90 day orders for patient's prescribed Lamictal, Wellbutrin and Celexa e-scribed to [facility] per patient's request today and per authorization by Dr. Adele Schilder.  Called patient to inform all 3 orders were sent to [facility] in [place], [place] and she should be getting them in the mail soon.  Requested patient call back if any further problems.

## 2014-07-25 ENCOUNTER — Telehealth (HOSPITAL_COMMUNITY): Payer: Self-pay

## 2014-07-26 ENCOUNTER — Telehealth (HOSPITAL_COMMUNITY): Payer: Self-pay | Admitting: Psychiatry

## 2014-07-26 NOTE — Telephone Encounter (Signed)
I called again and left a message

## 2014-07-26 NOTE — Telephone Encounter (Signed)
I returned phone call and left a message to call us back.
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
## 2014-07-31 ENCOUNTER — Emergency Department (HOSPITAL_COMMUNITY)
Admission: EM | Admit: 2014-07-31 | Discharge: 2014-07-31 | Disposition: A | Discharge status: Home / Self Care | Payer: 59 | Attending: Emergency Medicine | Admitting: Emergency Medicine

## 2014-07-31 ENCOUNTER — Emergency Department (HOSPITAL_COMMUNITY): Payer: 59

## 2014-07-31 ENCOUNTER — Encounter (HOSPITAL_COMMUNITY): Payer: Self-pay

## 2014-07-31 DIAGNOSIS — Z79899 Other long term (current) drug therapy: Secondary | ICD-10-CM | POA: Insufficient documentation

## 2014-07-31 DIAGNOSIS — Y9389 Activity, other specified: Secondary | ICD-10-CM | POA: Diagnosis not present

## 2014-07-31 DIAGNOSIS — Z87891 Personal history of nicotine dependence: Secondary | ICD-10-CM | POA: Insufficient documentation

## 2014-07-31 DIAGNOSIS — Z86010 Personal history of colonic polyps: Secondary | ICD-10-CM | POA: Insufficient documentation

## 2014-07-31 DIAGNOSIS — S00432A Contusion of left ear, initial encounter: Secondary | ICD-10-CM | POA: Diagnosis not present

## 2014-07-31 DIAGNOSIS — Z8619 Personal history of other infectious and parasitic diseases: Secondary | ICD-10-CM | POA: Diagnosis not present

## 2014-07-31 DIAGNOSIS — Z86018 Personal history of other benign neoplasm: Secondary | ICD-10-CM | POA: Insufficient documentation

## 2014-07-31 DIAGNOSIS — Y998 Other external cause status: Secondary | ICD-10-CM | POA: Diagnosis not present

## 2014-07-31 DIAGNOSIS — R6884 Jaw pain: Secondary | ICD-10-CM

## 2014-07-31 DIAGNOSIS — Y9229 Other specified public building as the place of occurrence of the external cause: Secondary | ICD-10-CM | POA: Diagnosis not present

## 2014-07-31 DIAGNOSIS — Z862 Personal history of diseases of the blood and blood-forming organs and certain disorders involving the immune mechanism: Secondary | ICD-10-CM | POA: Diagnosis not present

## 2014-07-31 DIAGNOSIS — K219 Gastro-esophageal reflux disease without esophagitis: Secondary | ICD-10-CM | POA: Insufficient documentation

## 2014-07-31 DIAGNOSIS — Z872 Personal history of diseases of the skin and subcutaneous tissue: Secondary | ICD-10-CM | POA: Insufficient documentation

## 2014-07-31 DIAGNOSIS — H1131 Conjunctival hemorrhage, right eye: Secondary | ICD-10-CM | POA: Insufficient documentation

## 2014-07-31 DIAGNOSIS — S0990XA Unspecified injury of head, initial encounter: Secondary | ICD-10-CM | POA: Diagnosis present

## 2014-07-31 DIAGNOSIS — Z85828 Personal history of other malignant neoplasm of skin: Secondary | ICD-10-CM | POA: Diagnosis not present

## 2014-07-31 DIAGNOSIS — S0083XA Contusion of other part of head, initial encounter: Secondary | ICD-10-CM | POA: Diagnosis not present

## 2014-07-31 DIAGNOSIS — F319 Bipolar disorder, unspecified: Secondary | ICD-10-CM | POA: Insufficient documentation

## 2014-07-31 DIAGNOSIS — S0093XA Contusion of unspecified part of head, initial encounter: Secondary | ICD-10-CM

## 2014-07-31 DIAGNOSIS — Z8742 Personal history of other diseases of the female genital tract: Secondary | ICD-10-CM | POA: Diagnosis not present

## 2014-07-31 MED ORDER — ONDANSETRON 4 MG PO TBDP
4.0000 mg | ORAL_TABLET | Freq: Once | ORAL | Status: AC
  Administered 2014-07-31: 4 mg via ORAL
  Filled 2014-07-31: qty 1

## 2014-07-31 MED ORDER — HYDROCODONE-ACETAMINOPHEN 5-325 MG PO TABS
2.0000 | ORAL_TABLET | Freq: Once | ORAL | Status: AC
  Administered 2014-07-31: 2 via ORAL
  Filled 2014-07-31: qty 2

## 2014-07-31 MED ORDER — HYDROCODONE-ACETAMINOPHEN 5-325 MG PO TABS: 2.0000 | ORAL_TABLET | Freq: Once | ORAL | Status: DC

## 2014-07-31 NOTE — ED Notes (Signed)
Patient transported to CT 

## 2014-07-31 NOTE — Discharge Instructions (Signed)
Head Injury °You have received a head injury. It does not appear serious at this time. Headaches and vomiting are common following head injury. It should be easy to awaken from sleeping. Sometimes it is necessary for you to stay in the emergency department for a while for observation. Sometimes admission to the hospital may be needed. After injuries such as yours, most problems occur within the first 24 hours, but side effects may occur up to 7-10 days after the injury. It is important for you to carefully monitor your condition and contact your health care provider or seek immediate medical care if there is a change in your condition. °WHAT ARE THE TYPES OF HEAD INJURIES? °Head injuries can be as minor as a bump. Some head injuries can be more severe. More severe head injuries include: °· A jarring injury to the brain (concussion). °· A bruise of the brain (contusion). This mean there is bleeding in the brain that can cause swelling. °· A cracked skull (skull fracture). °· Bleeding in the brain that collects, clots, and forms a bump (hematoma). °WHAT CAUSES A HEAD INJURY? °A serious head injury is most likely to happen to someone who is in a car wreck and is not wearing a seat belt. Other causes of major head injuries include bicycle or motorcycle accidents, sports injuries, and falls. °HOW ARE HEAD INJURIES DIAGNOSED? °A complete history of the event leading to the injury and your current symptoms will be helpful in diagnosing head injuries. Many times, pictures of the brain, such as CT or MRI are needed to see the extent of the injury. Often, an overnight hospital stay is necessary for observation.  °WHEN SHOULD I SEEK IMMEDIATE MEDICAL CARE?  °You should get help right away if: °· You have confusion or drowsiness. °· You feel sick to your stomach (nauseous) or have continued, forceful vomiting. °· You have dizziness or unsteadiness that is getting worse. °· You have severe, continued headaches not relieved by  medicine. Only take over-the-counter or prescription medicines for pain, fever, or discomfort as directed by your health care provider. °· You do not have normal function of the arms or legs or are unable to walk. °· You notice changes in the black spots in the center of the colored part of your eye (pupil). °· You have a clear or bloody fluid coming from your nose or ears. °· You have a loss of vision. °During the next 24 hours after the injury, you must stay with someone who can watch you for the warning signs. This person should contact local emergency services (911 in the [place] if you have seizures, you become unconscious, or you are unable to wake up. °HOW CAN I PREVENT A HEAD INJURY IN THE FUTURE? °The most important factor for preventing major head injuries is avoiding motor vehicle accidents.  To minimize the potential for damage to your head, it is crucial to wear seat belts while riding in motor vehicles. Wearing helmets while bike riding and playing collision sports (like football) is also helpful. Also, avoiding dangerous activities around the house will further help reduce your risk of head injury.  °WHEN CAN I RETURN TO NORMAL ACTIVITIES AND ATHLETICS? °You should be reevaluated by your health care provider before returning to these activities. If you have any of the following symptoms, you should not return to activities or contact sports until 1 week after the symptoms have stopped: °· Persistent headache. °· Dizziness or vertigo. °· Poor attention and concentration. °· Confusion. °·   Memory problems.  Nausea or vomiting.  Fatigue or tire easily.  Irritability.  Intolerant of bright lights or loud noises.  Anxiety or depression.  Disturbed sleep. MAKE SURE YOU:   Understand these instructions.  Will watch your condition.  Will get help right away if you are not doing well or get worse. Document Released: 02/04/2005 Document Revised: 02/09/2013 Document Reviewed:  10/12/2012 [facility] Patient Information 2015 [place], [place]. This information is not intended to replace advice given to you by your health care provider. Make sure you discuss any questions you have with your health care provider.  Facial or Scalp Contusion A facial or scalp contusion is a deep bruise on the face or head. Injuries to the face and head generally cause a lot of swelling, especially around the eyes. Contusions are the result of an injury that caused bleeding under the skin. The contusion may turn blue, purple, or yellow. Minor injuries will give you a painless contusion, but more severe contusions may stay painful and swollen for a few weeks.  CAUSES  A facial or scalp contusion is caused by a blunt injury or trauma to the face or head area.  SIGNS AND SYMPTOMS   Swelling of the injured area.   Discoloration of the injured area.   Tenderness, soreness, or pain in the injured area.  DIAGNOSIS  The diagnosis can be made by taking a medical history and doing a physical exam. An X-ray exam, CT scan, or MRI may be needed to determine if there are any associated injuries, such as broken bones (fractures). TREATMENT  Often, the best treatment for a facial or scalp contusion is applying cold compresses to the injured area. Over-the-counter medicines may also be recommended for pain control.  HOME CARE INSTRUCTIONS   Only take over-the-counter or prescription medicines as directed by your health care provider.   Apply ice to the injured area.   Put ice in a plastic bag.   Place a towel between your skin and the bag.   Leave the ice on for 20 minutes, 2-3 times a day.  SEEK MEDICAL CARE IF:  You have bite problems.   You have pain with chewing.   You are concerned about facial defects. SEEK IMMEDIATE MEDICAL CARE IF:  You have severe pain or a headache that is not relieved by medicine.   You have unusual sleepiness, confusion, or personality changes.   You  throw up (vomit).   You have a persistent nosebleed.   You have double vision or blurred vision.   You have fluid drainage from your nose or ear.   You have difficulty walking or using your arms or legs.  MAKE SURE YOU:   Understand these instructions.  Will watch your condition.  Will get help right away if you are not doing well or get worse. Document Released: 03/14/2004 Document Revised: 11/25/2012 Document Reviewed: 09/17/2012 [facility] Patient Information 2015 [place], [place]. This information is not intended to replace advice given to you by your health care provider. Make sure you discuss any questions you have with your health care provider.  Mandibular Contusion A mandibular contusion is a deep bruise of your jaw. Contusions are the result of an injury that caused bleeding under the skin. The contusion may turn blue, purple, or yellow. Minor injuries will give you a painless contusion, but more severe contusions may stay painful and swollen for a few weeks.  CAUSES A mandibular contusion comes from a direct force to that area, such as  falling or a punch to the jaw. SYMPTOMS   Jaw pain.  Jaw swelling.  Jaw bruising.  Jaw tenderness. DIAGNOSIS  The diagnosis can be made by taking your history and doing a physical exam. You may need an X-ray of your jaw to look for a broken bone (fracture). TREATMENT Often, the best treatment for a mandibular contusion is applying cold compresses to the injured area and eating a soft diet. Over-the-counter medicines may also be recommended for pain control.  HOME CARE INSTRUCTIONS   Put ice on the injured area.  Put ice in a plastic bag.  Place a towel between your skin and the bag.  Leave the ice on for 15-20 minutes, 03-04 times a day.  Eat soft foods for 1 week. Soft foods include baby food, gelatin, cooked cereal, ice cream, applesauce, bananas, eggs, pasta, cottage cheese, soups, and yogurt. Cut food into smaller pieces  for less chewing. Avoid chewing gum or ice.  Only take over-the-counter or prescription medicines for pain, discomfort, or fever as directed by your caregiver.  Avoid opening your mouth widely. This includes opening your mouth to eat large pieces of food or to yawn, scream, yell, or sing. SEEK IMMEDIATE MEDICAL CARE IF:   Your swelling or pain is not relieved with medicines.  You are not improving.  You have any cracking or clicking (crepitation) in the jaw joint. MAKE SURE YOU:   Understand these instructions.  Will watch your condition.  Will get help right away if you are not doing well or get worse. Document Released: 04/27/2003 Document Revised: 04/29/2011 Document Reviewed: 12/21/2010 [facility] Patient Information 2015 [place], [place]. This information is not intended to replace advice given to you by your health care provider. Make sure you discuss any questions you have with your health care provider.
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
## 2014-08-25 ENCOUNTER — Ambulatory Visit (INDEPENDENT_AMBULATORY_CARE_PROVIDER_SITE_OTHER): Payer: 59 | Admitting: Clinical

## 2014-08-25 ENCOUNTER — Ambulatory Visit (HOSPITAL_COMMUNITY): Payer: Self-pay | Admitting: Clinical

## 2014-08-25 ENCOUNTER — Encounter (HOSPITAL_COMMUNITY): Payer: Self-pay | Admitting: Clinical

## 2014-08-25 DIAGNOSIS — F1099 Alcohol use, unspecified with unspecified alcohol-induced disorder: Secondary | ICD-10-CM | POA: Diagnosis not present

## 2014-08-25 DIAGNOSIS — F411 Generalized anxiety disorder: Secondary | ICD-10-CM

## 2014-08-25 DIAGNOSIS — F1020 Alcohol dependence, uncomplicated: Secondary | ICD-10-CM
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
## 2014-09-15 ENCOUNTER — Ambulatory Visit (HOSPITAL_COMMUNITY): Payer: Self-pay | Admitting: Psychiatry

## 2014-09-27 ENCOUNTER — Ambulatory Visit (HOSPITAL_COMMUNITY): Payer: Self-pay | Admitting: Clinical

## 2014-10-10 ENCOUNTER — Ambulatory Visit (HOSPITAL_COMMUNITY): Payer: Self-pay | Admitting: Clinical

## 2014-10-21 ENCOUNTER — Encounter: Payer: Self-pay | Admitting: Certified Nurse Midwife

## 2014-11-04 ENCOUNTER — Telehealth (HOSPITAL_COMMUNITY): Payer: Self-pay

## 2014-11-07 ENCOUNTER — Other Ambulatory Visit (HOSPITAL_COMMUNITY): Payer: Self-pay | Admitting: Psychiatry

## 2014-11-07 DIAGNOSIS — F319 Bipolar disorder, unspecified: Secondary | ICD-10-CM

## 2014-11-07 MED ORDER — CITALOPRAM HYDROBROMIDE 40 MG PO TABS: 40.0000 mg | ORAL_TABLET | Freq: Every day | ORAL | Status: DC

## 2014-11-07 MED ORDER — LAMOTRIGINE 100 MG PO TABS: 100.0000 mg | ORAL_TABLET | Freq: Every day | ORAL | Status: DC

## 2014-11-08 ENCOUNTER — Telehealth (HOSPITAL_COMMUNITY): Payer: Self-pay

## 2014-11-08 NOTE — Telephone Encounter (Signed)
Telephone call with patient to inform Dr. Adele Schilder filled her Celexa and Lamictal on 11/07/14 for one more refill but would not refill medication without evaluating patient.  Patient stated she would call back and make an appointment.  Informed patient Dr. Adele Schilder is booked out pretty far and encouraged her to call back today to make an appointment for first available as he will not refill medications again without patient being seen and patient stated understanding.

## 2014-12-27 ENCOUNTER — Ambulatory Visit (INDEPENDENT_AMBULATORY_CARE_PROVIDER_SITE_OTHER): Payer: Managed Care, Other (non HMO) | Admitting: Allergy and Immunology

## 2014-12-27 ENCOUNTER — Encounter: Payer: Self-pay | Admitting: Allergy and Immunology

## 2014-12-27 VITALS — BP 110/70 | HR 100 | Temp 98.3°F | Resp 16 | Ht 59.06 in | Wt 134.5 lb

## 2014-12-27 DIAGNOSIS — J310 Chronic rhinitis: Secondary | ICD-10-CM | POA: Diagnosis not present

## 2014-12-27 DIAGNOSIS — Z91018 Allergy to other foods: Secondary | ICD-10-CM

## 2014-12-27 DIAGNOSIS — K2970 Gastritis, unspecified, without bleeding: Secondary | ICD-10-CM

## 2014-12-27 MED ORDER — FLUTICASONE PROPIONATE 50 MCG/ACT NA SUSP
NASAL | Status: DC
Start: 2014-12-27 — End: 2015-03-29

## 2014-12-27 NOTE — Assessment & Plan Note (Signed)
Non-allergic rhinitis.  All seasonal and perennial aeroallergen skin tests are negative despite a positive histamine control.  Intranasal steroids and intranasal antihistamines are effective for symptoms associated with non-allergic rhinitis, whereas second generation antihistamines such as cetirizine, loratadine and fexofenadine have been found to be ineffective for this condition.  A prescription has been provided for fluticasone nasal spray, one spray per nostril 1-2 times daily as needed. Proper nasal spray technique has been discussed and demonstrated. 

## 2014-12-27 NOTE — Patient Instructions (Addendum)
History of food allergy Gastrointestinal symptoms, uncertain etiology. Skin tests to select food allergens were negative today. The negative predictive value of food allergen skin testing is excellent (approximately 95%). While this does not appear to be an IgE mediated issue, skin testing does not rule out food intolerances or cell-mediated enteropathies which may lend to GI symptoms. These etiologies are suggested when elimination of the responsible food leads to symptom resolution and re-introduction of the food is followed by the return of symptoms.   The patient has been encouraged to keep a careful symptom/food journal and eliminate any food suspected of correlating with symptoms.   If GI symptoms persist or progress, gastroenterologist evaluation may be warranted.  Chronic rhinitis Non-allergic rhinitis.  All seasonal and perennial aeroallergen skin tests are negative despite a positive histamine control.  Intranasal steroids and intranasal antihistamines are effective for symptoms associated with non-allergic rhinitis, whereas second generation antihistamines such as cetirizine, loratadine and fexofenadine have been found to be ineffective for this condition.  A prescription has been provided for fluticasone nasal spray, one spray per nostril 1-2 times daily as needed. Proper nasal spray technique has been discussed and demonstrated.    Return if symptoms worsen or fail to improve.

## 2014-12-27 NOTE — Assessment & Plan Note (Signed)
Gastrointestinal symptoms, uncertain etiology. Skin tests to select food allergens were negative today. The negative predictive value of food allergen skin testing is excellent (approximately 95%). While this does not appear to be an IgE mediated issue, skin testing does not rule out food intolerances or cell-mediated enteropathies which may lend to GI symptoms. These etiologies are suggested when elimination of the responsible food leads to symptom resolution and re-introduction of the food is followed by the return of symptoms.   The patient has been encouraged to keep a careful symptom/food journal and eliminate any food suspected of correlating with symptoms.   If GI symptoms persist or progress, gastroenterologist evaluation may be warranted. 

## 2014-12-27 NOTE — Progress Notes (Signed)
History of present illness: HPI Comments: Sarah Barry is a 44 y.o. female who presents today for her initial consultation for possible food allergies.  She reports that over the past 15 years she has experienced abdominal bloating, constipation, and rare occasions of emesis.  The symptoms have progressed over the past 2 years.  She reports the symptoms are primarily triggered with the consumption of sugar, however she experiences these symptoms with every meal and all foods with the exception of grilled chicken and grilled vegetables.  She denies diarrhea.  She denies concomitant cardiopulmonary symptoms.  She had taken Nexium for years without perceived symptom reduction.  More recently, she has started taking probiotics and digestive enzymes with mild symptom reduction.  Sarah Barry experiences nasal congestion, rhinorrhea, and postnasal drainage. No significant seasonal symptom variation has been noted nor have specific environmental triggers been identified.   Assessment and plan: No problem-specific assessment & plan notes found for this encounter.   Medications ordered this encounter: No orders of the defined types were placed in this encounter.    Diagnositics: Environmental skin testing: Negative despite a positive histamine control Food allergen skin testing: Negative despite a positive histamine control    Physical examination: Blood pressure 110/70, pulse 100, temperature 98.3 F (36.8 C), resp. rate 16, height 4' 11.06" (1.5 m), weight 134 lb 7.7 oz (61 kg).  General: Alert, interactive, in no acute distress. HEENT: TMs pearly gray, turbinates moderately edematous with crusty discharge, post-pharynx moderately erythematous. Neck: Supple without lymphadenopathy. Lungs: Clear to auscultation without wheezing, rhonchi or rales. CV: Normal S1, S2 without murmurs. Abdomen: Nondistended, nontender. Skin: Acneiform rash on the face and back of neck.. Extremities:  No clubbing, cyanosis  or edema. Neuro:   Grossly intact.  Review of systems: Review of Systems  Constitutional: Negative for fever, chills and weight loss.  HENT: Negative for nosebleeds.   Eyes: Negative for blurred vision.  Respiratory: Negative for hemoptysis.   Cardiovascular: Negative for chest pain.  Gastrointestinal: Positive for vomiting and constipation. Negative for diarrhea.  Genitourinary: Negative for dysuria.  Musculoskeletal: Negative for myalgias and joint pain.  Neurological: Negative for dizziness.  Endo/Heme/Allergies: Does not bruise/bleed easily.    Past medical history: Past Medical History  Diagnosis Date  . GERD (gastroesophageal reflux disease)   . Allergic rhinitis   . Folliculitis   . Chronic headache     during cycle  . Thrombocytosis ([place])   . Bipolar disorder ([place])   . Positive H. pylori test   . Depression   . Fatigue   . Tongue disorder   . Anemia   . Cancer (HCC)     skin ca - buttocks  . Colon polyp 04/2012    tubular adenoma  . Fibroid   . HSV infection   . Dysmenorrhea 05/19/2013    Past surgical history: Past Surgical History  Procedure Laterality Date  . Skin cancer excision      buttock area, Dr. Marlou Starks  . Colonoscopy  04/2012    tubular adenoma  . Colposcopy  2014  . Cholecystectomy  2000  . Myomectomy N/A 12/01/2013    Procedure: LAPAROSCOPY/GELPORT ASSISTED MYOMECTOMY/ADENOMYOMECTOMY;  Surgeon: Governor Specking, MD;  Location: [facility];  Service: Gynecology;  Laterality: N/A;  . Hysteroscopy w/d&c N/A 12/01/2013    Procedure: INSERTION OF SEPRAFILM;  Surgeon: Governor Specking, MD;  Location: [facility];  Service: Gynecology;  Laterality: N/A;    Family history: Family History  Problem Relation Age of Onset  .  Colon cancer Maternal Grandfather   . Cancer Maternal Grandfather     colon cancer  . Depression Maternal Grandfather   . Diabetes Paternal Grandmother   . Alcohol abuse Father   . Heart disease  Father 55    MI  . Hyperlipidemia Father   . Hypertension Father   . Depression Father   . Esophageal cancer Neg Hx   . Rectal cancer Neg Hx   . Stomach cancer Neg Hx   . Osteoporosis Maternal Grandmother   . Dementia Mother     Social history: Social History   Social History  . Marital Status: Single    Spouse Name: N/A  . Number of Children: 0  . Years of Education: N/A   Occupational History  . CNA at [employer]   .     Social History Main Topics  . Smoking status: Former Smoker -- 1.00 packs/day for 13 years    Types: Cigarettes  . Smokeless tobacco: Never Used  . Alcohol Use: No  . Drug Use: No  . Sexual Activity:    Partners: Male    Birth Control/ Protection: None   Other Topics Concern  . Not on file   Social History Narrative   Single.  Lives alone, has 1 dog.   Environmental History: The patient lives in a 45 year old condominium with carpeting throughout and central air/heat.  There is a dog in house which has access to her bedroom.  She is a former cigarette smoker.  Known medication allergies: No Known Allergies  Outpatient medications:   Medication List       This list is accurate as of: 12/27/14  2:48 PM.  Always use your most recent med list.               acyclovir 200 MG capsule  Commonly known as:  ZOVIRAX  Take 1 capsule (200 mg total) by mouth every 4 (four) hours while awake.     buPROPion 300 MG 24 hr tablet  Commonly known as:  WELLBUTRIN XL  Take 1 tablet (300 mg total) by mouth every morning.     citalopram 40 MG tablet  Commonly known as:  CELEXA  Take 1 tablet (40 mg total) by mouth at bedtime.     Digestive Enzymes Tabs  Take by mouth 3 (three) times daily with meals.     esomeprazole 20 MG capsule  Commonly known as:  NEXIUM  Take 20 mg by mouth daily at 12 noon.     ferrous sulfate 325 (65 FE) MG tablet  Take 325 mg by mouth daily with breakfast.     lamoTRIgine 100 MG tablet  Commonly known as:   LAMICTAL  Take 1 tablet (100 mg total) by mouth daily.     multivitamins ther. w/minerals Tabs tablet  Take 1 tablet by mouth daily.        I appreciate the opportunity to take part in this [facility] care. Please do not hesitate to contact me with questions.  Sincerely,   R. Edgar Frisk, MD

## 2015-02-21 ENCOUNTER — Other Ambulatory Visit: Payer: Self-pay | Admitting: Nurse Practitioner

## 2015-02-21 ENCOUNTER — Other Ambulatory Visit (HOSPITAL_COMMUNITY): Payer: Self-pay | Admitting: Psychiatry

## 2015-02-21 NOTE — Telephone Encounter (Signed)
Medication refill request: acyclovir  Last AEX:  06/27/14 DL Next AEX: 07/06/15 DL Last MMG (if hormonal medication request): 07/23/13 [id number]:neg  Refill authorized: 06/23/13 #90caps/ 3R.

## 2015-02-27 ENCOUNTER — Telehealth (HOSPITAL_COMMUNITY): Payer: Self-pay

## 2015-02-27 DIAGNOSIS — F319 Bipolar disorder, unspecified: Secondary | ICD-10-CM

## 2015-02-27 NOTE — Telephone Encounter (Signed)
Medication management - Message left for pt. requesting call back to verify still taking Celexa and Lamictal per Dr. Adele Schilder request.  Informed pt. of need to schedule appt. and would provide a 30 day 1 time order if still taking daily.  Awaiting pt. To call back to schedule and to verify dosage to e-scribe to her pharmacy.

## 2015-03-01 MED ORDER — LAMOTRIGINE 100 MG PO TABS: 100.0000 mg | ORAL_TABLET | Freq: Every day | ORAL | Status: DC

## 2015-03-01 MED ORDER — BUPROPION HCL ER (XL) 300 MG PO TB24: 300.0000 mg | ORAL_TABLET | ORAL | Status: DC

## 2015-03-01 MED ORDER — CITALOPRAM HYDROBROMIDE 40 MG PO TABS: 40.0000 mg | ORAL_TABLET | Freq: Every day | ORAL | Status: DC

## 2015-03-01 NOTE — Telephone Encounter (Signed)
Telephone call with patient who reported her pharmacy had been filling her Wellbutrin XL, Celexa and Lamictal as ordered on 11/07/14 since that date and reports she has not been missing any dosages.  States she has been taking medication consistently and requests a refill of each to last until she sees Dr. Adele Schilder on 03/16/15.  Met with Dr. Adele Schilder who approved one time refills for 30 days of each medication and new 30 day orders for patient's Lamictal, Celexa and Wellbutrin XL e-scribed to patient's  [place] on [address] as ordered. Patient to keep appointment on 03/16/15 for any further refills.

## 2015-03-02 ENCOUNTER — Ambulatory Visit (HOSPITAL_COMMUNITY): Payer: Self-pay | Admitting: Psychiatry

## 2015-03-16 ENCOUNTER — Ambulatory Visit (HOSPITAL_COMMUNITY): Payer: Self-pay | Admitting: Psychiatry

## 2015-03-29 ENCOUNTER — Ambulatory Visit (INDEPENDENT_AMBULATORY_CARE_PROVIDER_SITE_OTHER): Payer: 59 | Admitting: Psychiatry

## 2015-03-29 ENCOUNTER — Encounter (HOSPITAL_COMMUNITY): Payer: Self-pay | Admitting: Psychiatry

## 2015-03-29 VITALS — BP 112/68 | HR 89 | Ht 60.0 in | Wt 139.0 lb

## 2015-03-29 DIAGNOSIS — F319 Bipolar disorder, unspecified: Secondary | ICD-10-CM | POA: Diagnosis not present

## 2015-03-29 MED ORDER — CITALOPRAM HYDROBROMIDE 40 MG PO TABS: 40.0000 mg | ORAL_TABLET | Freq: Every day | ORAL | Status: DC

## 2015-03-29 MED ORDER — LAMOTRIGINE 100 MG PO TABS: 100.0000 mg | ORAL_TABLET | Freq: Every day | ORAL | Status: DC

## 2015-03-29 MED ORDER — BUPROPION HCL ER (XL) 300 MG PO TB24: 300.0000 mg | ORAL_TABLET | ORAL | Status: DC
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
## 2015-05-07 ENCOUNTER — Encounter (HOSPITAL_BASED_OUTPATIENT_CLINIC_OR_DEPARTMENT_OTHER): Payer: Self-pay | Admitting: *Deleted

## 2015-05-07 ENCOUNTER — Emergency Department (HOSPITAL_BASED_OUTPATIENT_CLINIC_OR_DEPARTMENT_OTHER)
Admission: EM | Admit: 2015-05-07 | Discharge: 2015-05-08 | Disposition: A | Discharge status: Home / Self Care | Payer: Managed Care, Other (non HMO) | Attending: Emergency Medicine | Admitting: Emergency Medicine

## 2015-05-07 DIAGNOSIS — Z85828 Personal history of other malignant neoplasm of skin: Secondary | ICD-10-CM | POA: Diagnosis not present

## 2015-05-07 DIAGNOSIS — F319 Bipolar disorder, unspecified: Secondary | ICD-10-CM | POA: Diagnosis not present

## 2015-05-07 DIAGNOSIS — Z86010 Personal history of colonic polyps: Secondary | ICD-10-CM | POA: Diagnosis not present

## 2015-05-07 DIAGNOSIS — M25511 Pain in right shoulder: Secondary | ICD-10-CM | POA: Diagnosis not present

## 2015-05-07 DIAGNOSIS — Z8742 Personal history of other diseases of the female genital tract: Secondary | ICD-10-CM | POA: Diagnosis not present

## 2015-05-07 DIAGNOSIS — Z87891 Personal history of nicotine dependence: Secondary | ICD-10-CM | POA: Insufficient documentation

## 2015-05-07 DIAGNOSIS — R202 Paresthesia of skin: Secondary | ICD-10-CM | POA: Diagnosis not present

## 2015-05-07 DIAGNOSIS — D649 Anemia, unspecified: Secondary | ICD-10-CM | POA: Insufficient documentation

## 2015-05-07 DIAGNOSIS — Z872 Personal history of diseases of the skin and subcutaneous tissue: Secondary | ICD-10-CM | POA: Diagnosis not present

## 2015-05-07 DIAGNOSIS — K219 Gastro-esophageal reflux disease without esophagitis: Secondary | ICD-10-CM | POA: Diagnosis not present

## 2015-05-07 DIAGNOSIS — Z8619 Personal history of other infectious and parasitic diseases: Secondary | ICD-10-CM | POA: Insufficient documentation

## 2015-05-07 DIAGNOSIS — G8929 Other chronic pain: Secondary | ICD-10-CM | POA: Diagnosis not present

## 2015-05-07 DIAGNOSIS — Z79899 Other long term (current) drug therapy: Secondary | ICD-10-CM | POA: Insufficient documentation

## 2015-05-07 LAB — COMPREHENSIVE METABOLIC PANEL
ALBUMIN: 4 g/dL (ref 3.5–5.0)
ALT: 26 U/L (ref 14–54)
AST: 23 U/L (ref 15–41)
Alkaline Phosphatase: 100 U/L (ref 38–126)
Anion gap: 11 (ref 5–15)
BILIRUBIN TOTAL: 0.6 mg/dL (ref 0.3–1.2)
BUN: 20 mg/dL (ref 6–20)
CO2: 25 mmol/L (ref 22–32)
CREATININE: 0.86 mg/dL (ref 0.44–1.00)
Calcium: 8.5 mg/dL — ABNORMAL LOW (ref 8.9–10.3)
Chloride: 101 mmol/L (ref 101–111)
GFR calc Af Amer: >60 mL/min (ref >60)
GLUCOSE: 124 mg/dL — AB (ref 65–99)
Potassium: 3.5 mmol/L (ref 3.5–5.1)
Sodium: 137 mmol/L (ref 135–145)
Total Protein: 7 g/dL (ref 6.5–8.1)

## 2015-05-07 LAB — CBC WITH DIFFERENTIAL/PLATELET
BASOS ABS: 0 10*3/uL (ref 0.0–0.1)
Basophils Relative: 0 %
Eosinophils Absolute: 0.2 10*3/uL (ref 0.0–0.7)
Eosinophils Relative: 2 %
HEMATOCRIT: 42.1 % (ref 36.0–46.0)
Hemoglobin: 14.1 g/dL (ref 12.0–15.0)
LYMPHS PCT: 27 %
Lymphs Abs: 3.3 10*3/uL (ref 0.7–4.0)
MCH: 29.7 pg (ref 26.0–34.0)
MCHC: 33.5 g/dL (ref 30.0–36.0)
MCV: 88.8 fL (ref 78.0–100.0)
Monocytes Absolute: 1.3 10*3/uL — ABNORMAL HIGH (ref 0.1–1.0)
Monocytes Relative: 10 %
NEUTROS ABS: 7.5 10*3/uL (ref 1.7–7.7)
Neutrophils Relative %: 61 %
Platelets: 382 10*3/uL (ref 150–400)
RBC: 4.74 MIL/uL (ref 3.87–5.11)
RDW: 13.1 % (ref 11.5–15.5)
WBC: 12.4 10*3/uL — AB (ref 4.0–10.5)

## 2015-05-07 MED ORDER — TRAMADOL HCL 50 MG PO TABS
50.0000 mg | ORAL_TABLET | Freq: Once | ORAL | Status: AC
  Administered 2015-05-08: 50 mg via ORAL
  Filled 2015-05-07: qty 1

## 2015-05-07 NOTE — ED Notes (Signed)
Numbness and tingling to both hands and right shoulder pain x 1 week

## 2015-05-07 NOTE — ED Provider Notes (Signed)
CSN: [id number]     Arrival date & time 05/07/15  1934 History   First MD Initiated Contact with Patient 05/07/15 2218     Chief Complaint  Patient presents with  . Hand Pain     (Consider location/radiation/quality/duration/timing/severity/associated sxs/prior Treatment) HPI   45 year old female with history of bipolar, anemia, dysmenorrhea, depression presenting complaining of tingling sensation to both hands. Patient states for the past 2 weeks she has had persistent tingling sensation to the tips of her fingers on both hands. She has to shake her hand to provide any relief. She is in taking ibuprofen without adequate improvement. While sitting in the ED she started to develop right shoulder pain as well. Her pain is a sharp and achy sensation, nonradiating worsening with movement. She was having trouble using her mouse today and decided to come to the ER. She denies any fever, headache, neck pain, chest pain, difficulty breathing. She denies dropping objects. No history of diabetes. Denies any nausea vomiting diarrhea or having rash. No recent medication changes. She denies alcohol abuse. No prior history of peripheral neuropathy.  Past Medical History  Diagnosis Date  . GERD (gastroesophageal reflux disease)   . Allergic rhinitis   . Folliculitis   . Chronic headache     during cycle  . Thrombocytosis ([place])   . Bipolar disorder ([place])   . Positive H. pylori test   . Depression   . Fatigue   . Tongue disorder   . Anemia   . Cancer (HCC)     skin ca - buttocks  . Colon polyp 04/2012    tubular adenoma  . Fibroid   . HSV infection   . Dysmenorrhea 05/19/2013   Past Surgical History  Procedure Laterality Date  . Skin cancer excision      buttock area, Dr. Marlou Starks  . Colonoscopy  04/2012    tubular adenoma  . Colposcopy  2014  . Cholecystectomy  2000  . Myomectomy N/A 12/01/2013    Procedure: LAPAROSCOPY/GELPORT ASSISTED MYOMECTOMY/ADENOMYOMECTOMY;  Surgeon: Governor Specking, MD;   Location: [facility];  Service: Gynecology;  Laterality: N/A;  . Hysteroscopy w/d&c N/A 12/01/2013    Procedure: INSERTION OF SEPRAFILM;  Surgeon: Governor Specking, MD;  Location: [facility];  Service: Gynecology;  Laterality: N/A;   Family History  Problem Relation Age of Onset  . Colon cancer Maternal Grandfather   . Cancer Maternal Grandfather     colon cancer  . Depression Maternal Grandfather   . Diabetes Paternal Grandmother   . Alcohol abuse Father   . Heart disease Father 78    MI  . Hyperlipidemia Father   . Hypertension Father   . Depression Father   . Esophageal cancer Neg Hx   . Rectal cancer Neg Hx   . Stomach cancer Neg Hx   . Osteoporosis Maternal Grandmother   . Dementia Mother    Social History  Substance Use Topics  . Smoking status: Former Smoker -- 1.00 packs/day for 13 years    Types: Cigarettes  . Smokeless tobacco: Never Used  . Alcohol Use: No   OB History    Gravida Para Term Preterm AB TAB SAB Ectopic Multiple Living   0 0 0 0 0 0 0 0 0 0      Review of Systems  All other systems reviewed and are negative.     Allergies  Review of patient's allergies indicates no known allergies.  Home Medications   Prior to Admission medications  Medication Sig Start Date End Date Taking? Authorizing Provider  ampicillin (PRINCIPEN) 500 MG capsule Take 500 mg by mouth as needed.   Yes Historical Provider, MD  buPROPion (WELLBUTRIN XL) 300 MG 24 hr tablet Take 1 tablet (300 mg total) by mouth every morning. 03/29/15  Yes Kathlee Nations, MD  citalopram (CELEXA) 40 MG tablet Take 1 tablet (40 mg total) by mouth at bedtime. 03/29/15  Yes Kathlee Nations, MD  Dexlansoprazole (DEXILANT PO) Take by mouth.   Yes Historical Provider, MD  Digestive Enzymes TABS Take by mouth 3 (three) times daily with meals.   Yes Historical Provider, MD  lamoTRIgine (LAMICTAL) 100 MG tablet Take 1 tablet (100 mg total) by mouth daily. 03/29/15  Yes Kathlee Nations, MD  Multiple Vitamins-Minerals (MULTIVITAMINS THER. W/MINERALS) TABS Take 1 tablet by mouth daily.     Yes Historical Provider, MD  acyclovir (ZOVIRAX) 200 MG capsule TAKE ONE CAPSULE BY MOUTH EVERY 4 HOURS WHILE  AWAKE 02/21/15   Regina Eck, CNM  ferrous sulfate 325 (65 FE) MG tablet Take 325 mg by mouth 3 (three) times daily with meals.     Historical Provider, MD   BP 123/78 mmHg  Pulse 94  Temp(Src) 98.5 F (36.9 C) (Oral)  Resp 16  Ht 5' (1.524 m)  Wt 61.689 kg  BMI 26.56 kg/m2  SpO2 98%  LMP 05/07/2015 Physical Exam  Constitutional: She appears well-developed and well-nourished. No distress.  Caucasian female, nontoxic in appearance  HENT:  Head: Atraumatic.  Eyes: Conjunctivae are normal.  Neck: Normal range of motion. Neck supple.  No cervical or paracervical spinal tenderness on palpation. Neck with full range of motion.  Cardiovascular: Normal rate and regular rhythm.   Pulmonary/Chest: Effort normal and breath sounds normal.  Musculoskeletal: She exhibits tenderness (Right shoulder: Shoulder is nontender to palpation however patient report increasing pain with shoulder extension. Full range of motion.).  Neurological: She is alert.  Bilateral on with normal skin appearance, radial pulse 2+ bilaterally, normal grip strength. Sensation is intact throughout both arms. Brisk cap refills to all fingers. Radial radialis deep tendon reflex intact bilaterally.  Skin: No rash noted.  Psychiatric: She has a normal mood and affect.  Nursing note and vitals reviewed.   ED Course  Procedures (including critical care time) Labs Review Labs Reviewed  CBC WITH DIFFERENTIAL/PLATELET - Abnormal; Notable for the following:    WBC 12.4 (*)    Monocytes Absolute 1.3 (*)    All other components within normal limits  COMPREHENSIVE METABOLIC PANEL - Abnormal; Notable for the following:    Glucose, Bld 124 (*)    Calcium 8.5 (*)    All other components within normal limits     Imaging Review No results found. I have personally reviewed and evaluated these images and lab results as part of my medical decision-making.   EKG Interpretation None      MDM   Final diagnoses:  Paresthesia of both hands  Right shoulder pain    BP 111/65 mmHg  Pulse 83  Temp(Src) 98.3 F (36.8 C) (Oral)  Resp 18  Ht 5' (1.524 m)  Wt 61.689 kg  BMI 26.56 kg/m2  SpO2 99%  LMP 05/07/2015   10:35 PM Patient presents with complaints of tingling sensation to both hands and now right shoulder pain. She appears to be neurovascularly intact with normal strength to her upper extremities. She does not have any history of peripheral neuropathy or diabetes. Her arms are  nonswollen, low suspicion for DVT. She is well appearing. I will check electrolytes to ensure no residual abnormality causing her tingling sensation. Ultram provide for pain relief.  12:13 AM Mild leukocytosis with WBC 12.4.  This is likely reactive and doubt infectious.  Electrolytes or reassuring.  Mild hypocalcemia of 8.5.  Lidoderm given for R shoulder.  Will provide a short course of pain medication and i encourage pt to f/u with her PCP for further care.    Domenic Moras, PA-C 05/08/15 0014  Merrily Pew, MD 05/10/15 [phone]

## 2015-05-08 MED ORDER — TRAMADOL HCL 50 MG PO TABS: 50.0000 mg | ORAL_TABLET | Freq: Four times a day (QID) | ORAL | Status: DC | PRN

## 2015-05-08 MED ORDER — LIDOCAINE 5 % EX PTCH
1.0000 | MEDICATED_PATCH | CUTANEOUS | Status: DC
  Filled 2015-05-08: qty 1

## 2015-05-08 MED ORDER — LIDOCAINE 5 % EX PTCH: 1.0000 | MEDICATED_PATCH | CUTANEOUS | Status: DC

## 2015-05-08 NOTE — Discharge Instructions (Signed)
Please follow up with your doctor for further evaluation of your tingling sensations.  Take ultram as needed for pain.    Paresthesia Paresthesia is an abnormal burning or prickling sensation. This sensation is generally felt in the hands, arms, legs, or feet. However, it may occur in any part of the body. Usually, it is not painful. The feeling may be described as:  Tingling or numbness.  Pins and needles.  Skin crawling.  Buzzing.  Limbs falling asleep.  Itching. Most people experience temporary (transient) paresthesia at some time in their lives. Paresthesia may occur when you breathe too quickly (hyperventilation). It can also occur without any apparent cause. Commonly, paresthesia occurs when pressure is placed on a nerve. The sensation quickly goes away after the pressure is removed. For some people, however, paresthesia is a long-lasting (chronic) condition that is caused by an underlying disorder. If you continue to have paresthesia, you may need further medical evaluation. HOME CARE INSTRUCTIONS Watch your condition for any changes. Taking the following actions may help to lessen any discomfort that you are feeling:  Avoid drinking alcohol.  Try acupuncture or massage to help relieve your symptoms.  Keep all follow-up visits as directed by your health care provider. This is important. SEEK MEDICAL CARE IF:  You continue to have episodes of paresthesia.  Your burning or prickling feeling gets worse when you walk.  You have pain, cramps, or dizziness.  You develop a rash. SEEK IMMEDIATE MEDICAL CARE IF:  You feel weak.  You have trouble walking or moving.  You have problems with speech, understanding, or vision.  You feel confused.  You cannot control your bladder or bowel movements.  You have numbness after an injury.  You faint.   This information is not intended to replace advice given to you by your health care provider. Make sure you discuss any questions  you have with your health care provider.   Document Released: 01/25/2002 Document Revised: 06/21/2014 Document Reviewed: 01/31/2014 Elsevier Interactive Patient Education [employer].

## 2015-05-19 ENCOUNTER — Emergency Department (HOSPITAL_COMMUNITY): Payer: Managed Care, Other (non HMO)

## 2015-05-19 ENCOUNTER — Emergency Department (HOSPITAL_COMMUNITY)
Admission: EM | Admit: 2015-05-19 | Discharge: 2015-05-19 | Disposition: A | Discharge status: Home / Self Care | Payer: Managed Care, Other (non HMO) | Attending: Emergency Medicine | Admitting: Emergency Medicine

## 2015-05-19 ENCOUNTER — Encounter (HOSPITAL_COMMUNITY): Payer: Self-pay

## 2015-05-19 DIAGNOSIS — Z86010 Personal history of colonic polyps: Secondary | ICD-10-CM | POA: Insufficient documentation

## 2015-05-19 DIAGNOSIS — R110 Nausea: Secondary | ICD-10-CM | POA: Diagnosis not present

## 2015-05-19 DIAGNOSIS — Z79899 Other long term (current) drug therapy: Secondary | ICD-10-CM | POA: Diagnosis not present

## 2015-05-19 DIAGNOSIS — R61 Generalized hyperhidrosis: Secondary | ICD-10-CM | POA: Insufficient documentation

## 2015-05-19 DIAGNOSIS — Z86018 Personal history of other benign neoplasm: Secondary | ICD-10-CM | POA: Insufficient documentation

## 2015-05-19 DIAGNOSIS — Z85828 Personal history of other malignant neoplasm of skin: Secondary | ICD-10-CM | POA: Insufficient documentation

## 2015-05-19 DIAGNOSIS — Z8742 Personal history of other diseases of the female genital tract: Secondary | ICD-10-CM | POA: Diagnosis not present

## 2015-05-19 DIAGNOSIS — G8929 Other chronic pain: Secondary | ICD-10-CM | POA: Insufficient documentation

## 2015-05-19 DIAGNOSIS — Z872 Personal history of diseases of the skin and subcutaneous tissue: Secondary | ICD-10-CM | POA: Diagnosis not present

## 2015-05-19 DIAGNOSIS — K219 Gastro-esophageal reflux disease without esophagitis: Secondary | ICD-10-CM | POA: Insufficient documentation

## 2015-05-19 DIAGNOSIS — Z8619 Personal history of other infectious and parasitic diseases: Secondary | ICD-10-CM | POA: Diagnosis not present

## 2015-05-19 DIAGNOSIS — F319 Bipolar disorder, unspecified: Secondary | ICD-10-CM | POA: Insufficient documentation

## 2015-05-19 DIAGNOSIS — D649 Anemia, unspecified: Secondary | ICD-10-CM | POA: Insufficient documentation

## 2015-05-19 DIAGNOSIS — Z87891 Personal history of nicotine dependence: Secondary | ICD-10-CM | POA: Insufficient documentation

## 2015-05-19 DIAGNOSIS — R079 Chest pain, unspecified: Secondary | ICD-10-CM

## 2015-05-19 LAB — BASIC METABOLIC PANEL
Anion gap: 11 (ref 5–15)
BUN: 9 mg/dL (ref 6–20)
CO2: 25 mmol/L (ref 22–32)
CREATININE: 0.74 mg/dL (ref 0.44–1.00)
Calcium: 8.7 mg/dL — ABNORMAL LOW (ref 8.9–10.3)
Chloride: 104 mmol/L (ref 101–111)
Glucose, Bld: 98 mg/dL (ref 65–99)
POTASSIUM: 3.8 mmol/L (ref 3.5–5.1)
SODIUM: 140 mmol/L (ref 135–145)

## 2015-05-19 LAB — CBC WITH DIFFERENTIAL/PLATELET
BASOS ABS: 0.1 10*3/uL (ref 0.0–0.1)
Basophils Relative: 0 %
EOS ABS: 0.2 10*3/uL (ref 0.0–0.7)
EOS PCT: 2 %
HCT: 40.9 % (ref 36.0–46.0)
HEMOGLOBIN: 13.5 g/dL (ref 12.0–15.0)
Lymphocytes Relative: 28 %
Lymphs Abs: 3.1 10*3/uL (ref 0.7–4.0)
MCH: 29.2 pg (ref 26.0–34.0)
MCHC: 33 g/dL (ref 30.0–36.0)
MCV: 88.3 fL (ref 78.0–100.0)
Monocytes Absolute: 0.9 10*3/uL (ref 0.1–1.0)
Monocytes Relative: 8 %
NEUTROS PCT: 62 %
Neutro Abs: 7 10*3/uL (ref 1.7–7.7)
Platelets: 411 10*3/uL — ABNORMAL HIGH (ref 150–400)
RBC: 4.63 MIL/uL (ref 3.87–5.11)
RDW: 12.8 % (ref 11.5–15.5)
WBC: 11.3 10*3/uL — AB (ref 4.0–10.5)

## 2015-05-19 LAB — I-STAT TROPONIN, ED: Troponin i, poc: 0 ng/mL (ref 0.00–0.08)

## 2015-05-19 LAB — TROPONIN I

## 2015-05-19 MED ORDER — ACETAMINOPHEN 500 MG PO TABS
1000.0000 mg | ORAL_TABLET | Freq: Once | ORAL | Status: AC
  Administered 2015-05-19: 1000 mg via ORAL
  Filled 2015-05-19: qty 2

## 2015-05-19 MED ORDER — SODIUM CHLORIDE 0.9 % IV BOLUS (SEPSIS)
500.0000 mL | Freq: Once | INTRAVENOUS | Status: AC
  Administered 2015-05-19: 500 mL via INTRAVENOUS

## 2015-05-19 MED ORDER — FAMOTIDINE 20 MG PO TABS
40.0000 mg | ORAL_TABLET | Freq: Once | ORAL | Status: AC
  Administered 2015-05-19: 40 mg via ORAL
  Filled 2015-05-19: qty 2

## 2015-05-19 NOTE — ED Provider Notes (Signed)
...CSN: [id number]     Arrival date & time 05/19/15  0906 History   First MD Initiated Contact with Patient 05/19/15 [phone]     Chief Complaint  Patient presents with  . Chest Pain     (Consider location/radiation/quality/duration/timing/severity/associated sxs/prior Treatment) HPI...Marland KitchenMarland KitchenChest pain described as "electric currents" going through chest for approximate 45 minutes starting at 7:30 AM today while working at [employer]. She felt hot and nauseated but not dyspneic or diaphoretic. No smoking. Her father had an MI in his late 37s. She has had no previous cardiac disease. She is feeling better now.  Past Medical History  Diagnosis Date  . GERD (gastroesophageal reflux disease)   . Allergic rhinitis   . Folliculitis   . Chronic headache     during cycle  . Thrombocytosis ([place])   . Bipolar disorder ([place])   . Positive H. pylori test   . Depression   . Fatigue   . Tongue disorder   . Anemia   . Cancer (HCC)     skin ca - buttocks  . Colon polyp 04/2012    tubular adenoma  . Fibroid   . HSV infection   . Dysmenorrhea 05/19/2013   Past Surgical History  Procedure Laterality Date  . Skin cancer excision      buttock area, Dr. Marlou Starks  . Colonoscopy  04/2012    tubular adenoma  . Colposcopy  2014  . Cholecystectomy  2000  . Myomectomy N/A 12/01/2013    Procedure: LAPAROSCOPY/GELPORT ASSISTED MYOMECTOMY/ADENOMYOMECTOMY;  Surgeon: Governor Specking, MD;  Location: [facility];  Service: Gynecology;  Laterality: N/A;  . Hysteroscopy w/d&c N/A 12/01/2013    Procedure: INSERTION OF SEPRAFILM;  Surgeon: Governor Specking, MD;  Location: [facility];  Service: Gynecology;  Laterality: N/A;   Family History  Problem Relation Age of Onset  . Colon cancer Maternal Grandfather   . Cancer Maternal Grandfather     colon cancer  . Depression Maternal Grandfather   . Diabetes Paternal Grandmother   . Alcohol abuse Father   . Heart disease  Father 51    MI  . Hyperlipidemia Father   . Hypertension Father   . Depression Father   . Esophageal cancer Neg Hx   . Rectal cancer Neg Hx   . Stomach cancer Neg Hx   . Osteoporosis Maternal Grandmother   . Dementia Mother    Social History  Substance Use Topics  . Smoking status: Former Smoker -- 1.00 packs/day for 13 years    Types: Cigarettes  . Smokeless tobacco: Never Used  . Alcohol Use: No   OB History    Gravida Para Term Preterm AB TAB SAB Ectopic Multiple Living   0 0 0 0 0 0 0 0 0 0      Review of Systems  All other systems reviewed and are negative.     Allergies  Review of patient's allergies indicates no known allergies.  Home Medications   Prior to Admission medications   Medication Sig Start Date End Date Taking? Authorizing Provider  acyclovir (ZOVIRAX) 200 MG capsule TAKE ONE CAPSULE BY MOUTH EVERY 4 HOURS WHILE  AWAKE Patient taking differently: TAKE ONE CAPSULE BY MOUTH EVERY 4 HOURS as needed 02/21/15  Yes Regina Eck, CNM  ampicillin (PRINCIPEN) 500 MG capsule Take 500 mg by mouth as needed. For acne   Yes Historical Provider, MD  buPROPion (WELLBUTRIN XL) 300 MG 24 hr tablet Take 1 tablet (300 mg total) by  mouth every morning. 03/29/15  Yes Kathlee Nations, MD  citalopram (CELEXA) 40 MG tablet Take 1 tablet (40 mg total) by mouth at bedtime. 03/29/15  Yes Kathlee Nations, MD  ferrous sulfate 325 (65 FE) MG tablet Take 325 mg by mouth daily with breakfast.    Yes Historical Provider, MD  lamoTRIgine (LAMICTAL) 100 MG tablet Take 1 tablet (100 mg total) by mouth daily. 03/29/15  Yes Kathlee Nations, MD  Dexlansoprazole (DEXILANT PO) Take by mouth.    Historical Provider, MD  Digestive Enzymes TABS Take by mouth 3 (three) times daily with meals.    Historical Provider, MD  lidocaine (LIDODERM) 5 % Place 1 patch onto the skin daily. Remove & Discard patch within 12 hours or as directed by MD Patient not taking: Reported on 05/19/2015 05/08/15   Domenic Moras, PA-C   Multiple Vitamins-Minerals (MULTIVITAMINS THER. W/MINERALS) TABS Take 1 tablet by mouth daily.      Historical Provider, MD  traMADol (ULTRAM) 50 MG tablet Take 1 tablet (50 mg total) by mouth every 6 (six) hours as needed. Patient not taking: Reported on 05/19/2015 05/08/15   Domenic Moras, PA-C   BP 95/67 mmHg  Pulse 73  Temp(Src) 98.2 F (36.8 C) (Oral)  Resp 14  SpO2 98%  LMP 05/07/2015 Physical Exam  Constitutional: She is oriented to person, place, and time. She appears well-developed and well-nourished.  HENT:  Head: Normocephalic and atraumatic.  Eyes: Conjunctivae and EOM are normal. Pupils are equal, round, and reactive to light.  Neck: Normal range of motion. Neck supple.  Cardiovascular: Normal rate and regular rhythm.   Pulmonary/Chest: Effort normal and breath sounds normal.  Abdominal: Soft. Bowel sounds are normal.  Musculoskeletal: Normal range of motion.  Neurological: She is alert and oriented to person, place, and time.  Skin: Skin is warm and dry.  Psychiatric: She has a normal mood and affect. Her behavior is normal.  Nursing note and vitals reviewed.   ED Course  Procedures (including critical care time) Labs Review Labs Reviewed  BASIC METABOLIC PANEL - Abnormal; Notable for the following:    Calcium 8.7 (*)    All other components within normal limits  CBC WITH DIFFERENTIAL/PLATELET - Abnormal; Notable for the following:    WBC 11.3 (*)    Platelets 411 (*)    All other components within normal limits  TROPONIN I  I-STAT TROPOININ, ED    Imaging Review Dg Chest 2 View  05/19/2015  CLINICAL DATA:  Chest pain starting today, left arm thing EXAM: CHEST  2 VIEW COMPARISON:  07/24/2009 FINDINGS: Cardiomediastinal silhouette is stable. No acute infiltrate or pleural effusion. No pulmonary edema. Mild degenerative changes mid thoracic spine. IMPRESSION: No active cardiopulmonary disease. Electronically Signed   By: Lahoma Crocker M.D.   On: 05/19/2015 10:01    I have personally reviewed and evaluated these images and lab results as part of my medical decision-making.   EKG Interpretation   Date/Time:  Friday May 19 2015 09:15:49 EDT Ventricular Rate:  77 PR Interval:  120 QRS Duration: 86 QT Interval:  375 QTC Calculation: 424 R Axis:   74 Text Interpretation:  Sinus rhythm Baseline wander in lead(s) III  Confirmed by Laxmi Choung  MD, Kateland Leisinger ([id number]) on 05/19/2015 9:24:12 AM Also  confirmed by Lacinda Axon  MD, Annaston Upham ([id number])  on 05/19/2015 9:24:59 AM      MDM   Final diagnoses:  Chest pain, unspecified chest pain type    Patient is in  no acute distress. She is low risk for ACS or PE. Screening tests including labs, troponin, EKG, chest x-ray all negative. She has primary care follow-up.    Nat Christen, MD 05/19/15 1524

## 2015-05-19 NOTE — ED Notes (Signed)
MD at bedside. 

## 2015-05-19 NOTE — ED Notes (Signed)
[facility]- pt is coming from work after she had an episode of substernal chest pain with radiation into the right arm. Pt received 324mg  of aspirin, 1 nitro tablet which dropped BP to 77/54 and pt had near syncopal episode. Pt had 4mg  of zofran and 200cc of fluid. Pt is pain free on arrival.

## 2015-05-19 NOTE — Discharge Instructions (Signed)
Tests showed no life-threatening condition. Follow-up your primary care doctor. If symptoms persist, will need to see a cardiologist.

## 2015-06-26 ENCOUNTER — Ambulatory Visit (HOSPITAL_COMMUNITY): Payer: Self-pay | Admitting: Psychiatry

## 2015-07-06 ENCOUNTER — Ambulatory Visit: Payer: 59 | Admitting: Certified Nurse Midwife

## 2015-09-12 ENCOUNTER — Encounter: Payer: Self-pay | Admitting: Certified Nurse Midwife

## 2015-09-13 ENCOUNTER — Encounter: Payer: Self-pay | Admitting: Certified Nurse Midwife

## 2015-09-13 NOTE — Telephone Encounter (Signed)
Advised will need to bring account current to schedule non-emergent care.  Routing to provider for final review. Patient agreeable to disposition. Will close encounter.

## 2015-09-18 ENCOUNTER — Other Ambulatory Visit (HOSPITAL_COMMUNITY): Payer: Self-pay | Admitting: Gastroenterology

## 2015-09-18 DIAGNOSIS — IMO0001 Reserved for inherently not codable concepts without codable children: Secondary | ICD-10-CM

## 2015-09-18 DIAGNOSIS — K219 Gastro-esophageal reflux disease without esophagitis: Secondary | ICD-10-CM

## 2015-09-21 ENCOUNTER — Telehealth: Payer: Self-pay | Admitting: Emergency Medicine

## 2015-09-21 ENCOUNTER — Encounter: Payer: Self-pay | Admitting: Certified Nurse Midwife

## 2015-09-21 ENCOUNTER — Ambulatory Visit: Payer: Managed Care, Other (non HMO) | Admitting: Certified Nurse Midwife

## 2015-09-21 VITALS — BP 100/64 | HR 68 | Resp 16 | Ht 59.75 in | Wt 129.0 lb

## 2015-09-21 DIAGNOSIS — A6000 Herpesviral infection of urogenital system, unspecified: Secondary | ICD-10-CM | POA: Diagnosis not present

## 2015-09-21 DIAGNOSIS — Z01419 Encounter for gynecological examination (general) (routine) without abnormal findings: Secondary | ICD-10-CM | POA: Diagnosis not present

## 2015-09-21 DIAGNOSIS — N644 Mastodynia: Secondary | ICD-10-CM

## 2015-09-21 DIAGNOSIS — Z0000 Encounter for general adult medical examination without abnormal findings: Secondary | ICD-10-CM | POA: Diagnosis not present

## 2015-09-21 LAB — POCT URINALYSIS DIPSTICK
Bilirubin, UA: NEGATIVE
Blood, UA: NEGATIVE
GLUCOSE UA: NEGATIVE
Ketones, UA: NEGATIVE
LEUKOCYTES UA: NEGATIVE
Nitrite, UA: NEGATIVE
Protein, UA: NEGATIVE
Urobilinogen, UA: NEGATIVE
pH, UA: 5

## 2015-09-21 MED ORDER — ACYCLOVIR 200 MG PO CAPS: ORAL_CAPSULE | ORAL | 0 refills | Status: DC

## 2015-09-21 NOTE — Telephone Encounter (Signed)
Patient scheduled for bilateral diagnostic mammogram and bilateral ultrasounds for 09/26/15 at 0730 at [facility] for bilateral breast tenderness.  Patient could not stay for imaging scheduling in office, call to patient and left message with details regarding appointment.

## 2015-09-21 NOTE — Patient Instructions (Signed)

## 2015-09-21 NOTE — Progress Notes (Signed)
45 y.o. G0P0000 Single  Caucasian Fe here for annual exam.  Periods normal, no issues. Continues to see Dr.Wong for aex and labs. 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 Acyclovir working well. Does not need refill. Seeing GI for ? Leaky gut syndrome, adjusting diet and not sure if helping or not. Having episodes of fatigue and has noted more fatigue in past week. Seeing DOC for adjustment tomorrow, to see if that will help. Complaining of breast tenderness., no discharge not like" I have with my periods" No new bra or injury. Denies excessive caffeine use.   No other health issues today. Brought labs with her, all WNL.  LMP      08/30/15     Sexually active: No.  The current method of family planning is abstinence.    Exercising: No.  exercise Smoker:  no  Health Maintenance: Pap:  06-27-14 neg  MMG:  07-22-13 category c density birads 1:neg Colonoscopy:  2014 benign polyps BMD:   none TDaP:  2013 Shingles: no Pneumonia: no Hep C and HIV: both negative 39yrs ago Labs: poct urine-neg Self breast exam: not done   reports that she has quit smoking. Her smoking use included Cigarettes. She has a 13.00 pack-year smoking history. She has never used smokeless tobacco. She reports that she does not drink alcohol or use drugs.  Past Medical History:  Diagnosis Date  . Allergic rhinitis   . Anemia   . Bipolar disorder ([place])   . Cancer (HCC)    skin ca - buttocks  . Chronic headache    during cycle  . Colon polyp 04/2012   tubular adenoma  . Depression   . Dysmenorrhea 05/19/2013  . Fatigue   . Fibroid   . Folliculitis   . GERD (gastroesophageal reflux disease)   . HSV infection   . Positive H. pylori test   . Thrombocytosis ([place])   . Tongue disorder     Past Surgical History:  Procedure Laterality Date  . CHOLECYSTECTOMY  2000  . COLONOSCOPY  04/2012   tubular adenoma  . COLPOSCOPY  2014  . HYSTEROSCOPY W/D&C N/A 12/01/2013   Procedure: INSERTION OF SEPRAFILM;  Surgeon: Governor Specking, MD;  Location: [facility];  Service: Gynecology;  Laterality: N/A;  . MYOMECTOMY N/A 12/01/2013   Procedure: LAPAROSCOPY/GELPORT ASSISTED MYOMECTOMY/ADENOMYOMECTOMY;  Surgeon: Governor Specking, MD;  Location: [facility];  Service: Gynecology;  Laterality: N/A;  . SKIN CANCER EXCISION     buttock area, Dr. Marlou Starks    Current Outpatient Prescriptions  Medication Sig Dispense Refill  . acyclovir (ZOVIRAX) 200 MG capsule TAKE ONE CAPSULE BY MOUTH EVERY 4 HOURS WHILE  AWAKE (Patient taking differently: TAKE ONE CAPSULE BY MOUTH EVERY 4 HOURS as needed) 90 capsule 0  . ampicillin (PRINCIPEN) 500 MG capsule Take 500 mg by mouth as needed. For acne    . buPROPion (WELLBUTRIN XL) 300 MG 24 hr tablet Take 1 tablet (300 mg total) by mouth every morning. 30 tablet 2  . citalopram (CELEXA) 40 MG tablet Take 1 tablet (40 mg total) by mouth at bedtime. 30 tablet 2  . Dexlansoprazole (DEXILANT PO) Take by mouth.    . Digestive Enzymes TABS Take by mouth 3 (three) times daily with meals.    . ferrous sulfate 325 (65 FE) MG tablet Take 325 mg by mouth daily with breakfast.     . lamoTRIgine (LAMICTAL) 100 MG tablet Take 1 tablet (100 mg total) by  mouth daily. 30 tablet 2  . lidocaine (LIDODERM) 5 % Place 1 patch onto the skin daily. Remove & Discard patch within 12 hours or as directed by MD (Patient not taking: Reported on 05/19/2015) 30 patch 0  . Multiple Vitamins-Minerals (MULTIVITAMINS THER. W/MINERALS) TABS Take 1 tablet by mouth daily.      . traMADol (ULTRAM) 50 MG tablet Take 1 tablet (50 mg total) by mouth every 6 (six) hours as needed. (Patient not taking: Reported on 05/19/2015) 15 tablet 0   No current facility-administered medications for this visit.     Family History  Problem Relation Age of Onset  . Colon cancer Maternal Grandfather   . Cancer Maternal Grandfather     colon cancer  . Depression Maternal Grandfather   . Diabetes Paternal Grandmother    . Alcohol abuse Father   . Heart disease Father 66    MI  . Hyperlipidemia Father   . Hypertension Father   . Depression Father   . Esophageal cancer Neg Hx   . Rectal cancer Neg Hx   . Stomach cancer Neg Hx   . Osteoporosis Maternal Grandmother   . Dementia Mother     ROS:  Pertinent items are noted in HPI.  Otherwise, a comprehensive ROS was negative.  Exam:   There were no vitals taken for this visit.   Ht Readings from Last 3 Encounters:  05/07/15 5' (1.524 m)  12/27/14 4' 11.06" (1.5 m)  07/31/14 5' (1.524 m)    General appearance: alert, cooperative and appears stated age Head: Normocephalic, without obvious abnormality, atraumatic Neck: no adenopathy, supple, symmetrical, trachea midline and thyroid normal to inspection and palpation Lungs: clear to auscultation bilaterally Breasts: normal appearance, no masses or tenderness, No nipple retraction or dimpling, No nipple discharge or bleeding, No axillary or supraclavicular adenopathy, breast tender bilateral upper outer quadrant, no masses noted or redness Heart: regular rate and rhythm Abdomen: soft, non-tender; no masses,  no organomegaly Extremities: extremities normal, atraumatic, no cyanosis or edema Skin: Skin color, texture, turgor normal. No rashes or lesions Lymph nodes: Cervical, supraclavicular, and axillary nodes normal. No abnormal inguinal nodes palpated Neurologic: Grossly normal   Pelvic: External genitalia:  Normal female, small herpetic blister noted in perineal area on left, shown to patient              Urethra:  normal appearing urethra with no masses, tenderness or lesions              Bartholin's and Skene's: normal                 Vagina: normal appearing vagina with normal color and discharge, no lesions              Cervix: no cervical motion tenderness, no lesions and nulliparous appearance              Pap taken: No. Bimanual Exam:  Uterus:  normal size, contour, position, consistency,  mobility, non-tender              Adnexa: normal adnexa and no mass, fullness, tenderness               Rectovaginal: Confirms               Anus:  normal sphincter tone, no lesions  Chaperone present: yes  A:  Well Woman with normal exam  Contraception abstinence  Breast tenderness  Fatigue ? Etiology GI management  HSV outbreak, history of with  Acyclovir  use  P:   Reviewed health and wellness pertinent to exam  Discussed normal breast tenderness with menses, but with this being different and mammogram due would recommend 3 D diagnostic mammogram with [place] if needed. Patient agreeable. She will be scheduled before leaving office today.  Discussed fatigue with HSV outbreak and one noted in perineal area, which she has had before. Patient was not aware. Has Acyclovir and will start on today. Discussed suppression therapy with medication, will consider. She may be having more frequent outbreaks that she aware. Questions addressed.  Pap smear as above  Not taken  counseled on breast self exam, mammography screening, adequate intake of calcium and vitamin D, diet and exercise  return annually or prn  An After Visit Summary was printed and given to the patient.

## 2015-09-22 NOTE — Progress Notes (Signed)
Encounter reviewed Kiyoshi Schaab, MD   

## 2015-09-25 ENCOUNTER — Encounter: Payer: Self-pay | Admitting: Certified Nurse Midwife

## 2015-09-25 ENCOUNTER — Ambulatory Visit (HOSPITAL_COMMUNITY)
Admission: RE | Admit: 2015-09-25 | Discharge: 2015-09-25 | Disposition: A | Discharge status: Home / Self Care | Payer: Managed Care, Other (non HMO) | Source: Ambulatory Visit | Attending: Gastroenterology | Admitting: Gastroenterology

## 2015-09-25 ENCOUNTER — Encounter (HOSPITAL_COMMUNITY): Payer: Self-pay

## 2015-09-25 DIAGNOSIS — K219 Gastro-esophageal reflux disease without esophagitis: Secondary | ICD-10-CM

## 2015-09-25 DIAGNOSIS — IMO0001 Reserved for inherently not codable concepts without codable children: Secondary | ICD-10-CM

## 2015-09-26 ENCOUNTER — Ambulatory Visit
Admission: RE | Admit: 2015-09-26 | Discharge: 2015-09-26 | Disposition: A | Discharge status: Home / Self Care | Payer: Managed Care, Other (non HMO) | Source: Ambulatory Visit | Attending: Certified Nurse Midwife | Admitting: Certified Nurse Midwife

## 2015-09-26 DIAGNOSIS — N644 Mastodynia: Secondary | ICD-10-CM

## 2015-09-28 ENCOUNTER — Telehealth: Payer: Self-pay

## 2015-09-28 NOTE — Telephone Encounter (Signed)
Spoke with patient. Patient reports that she is no longer having breast tenderness. Advised if breast tenderness returns she will need to be seen in the office for a recheck. She is agreeable and verbalizes understanding.  Routing to provider for final review. Patient agreeable to disposition. Will close encounter.

## 2015-09-28 NOTE — Telephone Encounter (Signed)
-----   Message from Regina Eck, CNM sent at 09/26/2015  8:12 AM EDT ----- Mammogram negative, no US done. Was done for  Breast tenderness. Will need follow up appointment if not resolved.

## 2016-04-19 ENCOUNTER — Telehealth: Payer: Self-pay | Admitting: Certified Nurse Midwife

## 2016-04-19 NOTE — Telephone Encounter (Signed)
Spoke with patient. Patient reports she has a new partner. Patient states she had pain during intercourse and after in vagina, started after intercourse last night. Patient reports not using lubricant, reports no need. Patient denies vaginal discharge, odor, bleeding. LMP 04/06/16. Recommended OV for further evaluation, patient declined OV for today d/t work schedule. Patient states she can only schedule late Mondays and Wednesdays. Advised patient no availability on 3/5 with Melvia Heaps, CNM or other providers. Patient scheduled for 3/7 at 3:15pm with Melvia Heaps, CNM. Advised patient if pain worsens, nausea/vomitting develops or fever -seek care at local ER/Urgent care. Patient verbalizes understanding and is agreeable. Last AEX 09/21/15.  Melvia Heaps, CNM

## 2016-04-19 NOTE — Telephone Encounter (Signed)
Patient having pain during intercourse. °

## 2016-04-19 NOTE — Telephone Encounter (Signed)
Left message to call Brok Stocking at [phone].  

## 2016-04-24 ENCOUNTER — Ambulatory Visit (INDEPENDENT_AMBULATORY_CARE_PROVIDER_SITE_OTHER): Payer: BLUE CROSS/BLUE SHIELD | Admitting: Certified Nurse Midwife

## 2016-04-24 ENCOUNTER — Encounter: Payer: Self-pay | Admitting: Certified Nurse Midwife

## 2016-04-24 VITALS — BP 110/60 | HR 70 | Resp 16 | Ht 59.75 in | Wt 130.0 lb

## 2016-04-24 DIAGNOSIS — Z113 Encounter for screening for infections with a predominantly sexual mode of transmission: Secondary | ICD-10-CM | POA: Diagnosis not present

## 2016-04-24 NOTE — Progress Notes (Signed)
46 y.o. Single Caucasian female G0P0000 here with complaint of vaginal pain with intercourse which resumed sexual activity in the past month. Describing pain as in buttock and throbbing in vagina. Had bleeding with intercourse on first episode. Does  Not using lubricant or moisturizer. Denies vaginal itching or burning or urinary symptoms. Had recent HSV outbreak and took her Acyclovir. Describes discharge as normal.. Denies new personal products or vaginal dryness.Has STD concerns and would like to have testing... Contraception is condoms but recent condom breakage, but no spillage per patient. Patient also has history of chronic bloating after eating has seen 3 GI specialists and had EGD with stretching a few years ago. Getting frustrated. Working currently on food elimination diet. "Any suggestions?" No diarrhea or loose stools or constipation. No vomiting or abdominal pain. No other concerns today.  ROS pertinent to HPI  O:Healthy female WDWN Affect: normal, orientation x 3  Exam:Skin: warm and dry Abdomen: soft, non tender, no rebound  Inguinal Lymph nodes: no enlargement or tenderness Pelvic exam: External genital: normal female, no lesions or HSV blisters noted BUS: negative Vagina: white non odorous discharge noted. Affirm taken Cervix: normal, non tender, no CMT Uterus: normal, non tender Adnexa:normal, non tender, no masses or fullness noted    A:Normal pelvic exam STD screening Chronic history of GI issues with bloating after eating Recent HSV outbreak after many years   P:Discussed findings of normal pelvic exam. Discussed pelvic pain most likely from impending HSV outbreak and etiology. Discussed suppression for HSV with Valtrex if occurs again, or can start now. Patient would like to think about this. Discussed STD prevention with having spermicide available if condom breaks to insert in vagina. Patient will plan this. Does not desire contraception change. Will treat per labs  if positive. Lab: GC,Chlamydia, HIV,RPR, Hep B, Hep C, Affirm  Discussed continue diet and would suggest Floragem refrigerated probiotic use twice daily to see if this help with digestion regulation and decrease bloating. Also if using straw, stop, due to swallowing more air. Work on good fiber in diet to help with digestion. Would recommend evaluation with Dr. Collene Mares if no change. Patient will advise. Questions addressed. Warning signs of abdominal pain given.  Rv prn

## 2016-04-24 NOTE — Patient Instructions (Addendum)
Diet for Irritable Bowel Syndrome When you have irritable bowel syndrome (IBS), the foods you eat and your eating habits are very important. IBS may cause various symptoms, such as abdominal pain, constipation, or diarrhea. Choosing the right foods can help ease discomfort caused by these symptoms. Work with your health care provider and dietitian to find the best eating plan to help control your symptoms. What general guidelines do I need to follow?  Keep a food diary. This will help you identify foods that cause symptoms. Write down:  What you eat and when.  What symptoms you have.  When symptoms occur in relation to your meals.  Avoid foods that cause symptoms. Talk with your dietitian about other ways to get the same nutrients that are in these foods.  Eat more foods that contain fiber. Take a fiber supplement if directed by your dietitian.  Eat your meals slowly, in a relaxed setting.  Aim to eat 5-6 small meals per day. Do not skip meals.  Drink enough fluids to keep your urine clear or pale yellow.  Ask your health care provider if you should take an over-the-counter probiotic during flare-ups to help restore healthy gut bacteria.  If you have cramping or diarrhea, try making your meals low in fat and high in carbohydrates. Examples of carbohydrates are pasta, rice, whole grain breads and cereals, fruits, and vegetables.  If dairy products cause your symptoms to flare up, try eating less of them. You might be able to handle yogurt better than other dairy products because it contains bacteria that help with digestion. What foods are not recommended? The following are some foods and drinks that may worsen your symptoms:  Fatty foods, such as [place] fries.  Milk products, such as cheese or ice cream.  Chocolate.  Alcohol.  Products with caffeine, such as coffee.  Carbonated drinks, such as soda. The items listed above may not be a complete list of foods and beverages to  avoid. Contact your dietitian for more information.  What foods are good sources of fiber? Your health care provider or dietitian may recommend that you eat more foods that contain fiber. Fiber can help reduce constipation and other IBS symptoms. Add foods with fiber to your diet a little at a time so that your body can get used to them. Too much fiber at once might cause gas and swelling of your abdomen. The following are some foods that are good sources of fiber:  Apples.  Peaches.  Pears.  Berries.  Figs.  Broccoli (raw).  Cabbage.  Carrots.  Raw peas.  Kidney beans.  Lima beans.  Whole grain bread.  Whole grain cereal. Where to find more information: [employer] for Functional Gastrointestinal Disorders: www.iffgd.[employer] of Diabetes and Digestive and Kidney Diseases: NetworkAffair.co.za.aspx This information is not intended to replace advice given to you by your health care provider. Make sure you discuss any questions you have with your health care provider. Document Released: 04/27/2003 Document Revised: 07/13/2015 Document Reviewed: 05/07/2013 Elsevier Interactive Patient Education  2017 [employer].  Sexually Transmitted Disease A sexually transmitted disease (STD) is a disease or infection often passed to another person during sex. However, STDs can be passed through nonsexual ways. An STD can be passed through:  Spit (saliva).  Semen.  Blood.  Mucus from the vagina.  Pee (urine). How can I lessen my chances of getting an STD?  Use:  Latex condoms.  Water-soluble lubricants with condoms. Do not use petroleum jelly or  oils.  Dental dams. These are small pieces of latex that are used as a barrier during oral sex.  Avoid having more than one sex partner.  Do not have sex with someone who has other sex partners.  Do not have sex with anyone you do not  know or who is at high risk for an STD.  Avoid risky sex that can break your skin.  Do not have sex if you have open sores on your mouth or skin.  Avoid drinking too much alcohol or taking illegal drugs. Alcohol and drugs can affect your good judgment.  Avoid oral and anal sex acts.  Get shots (vaccines) for HPV and hepatitis.  If you are at risk of being infected with HIV, it is advised that you take a certain medicine daily to prevent HIV infection. This is called pre-exposure prophylaxis (PrEP). You may be at risk if:  You are a man who has sex with other men (MSM).  You are attracted to the opposite sex (heterosexual) and are having sex with more than one partner.  You take drugs with a needle.  You have sex with someone who has HIV.  Talk with your doctor about if you are at high risk of being infected with HIV. If you begin to take PrEP, get tested for HIV first. Get tested every 3 months for as long as you are taking PrEP.  Get tested for STDs every year if you are sexually active. If you are treated for an STD, get tested again 3 months after you are treated. What should I do if I think I have an STD?  See your doctor.  Tell your sex partner(s) that you have an STD. They should be tested and treated.  Do not have sex until your doctor says it is okay. When should I get help? Get help right away if:  You have bad belly (abdominal) pain.  You are a man and have puffiness (swelling) or pain in your testicles.  You are a woman and have puffiness in your vagina. This information is not intended to replace advice given to you by your health care provider. Make sure you discuss any questions you have with your health care provider. Document Released: 03/14/2004 Document Revised: 07/13/2015 Document Reviewed: 07/31/2012 Elsevier Interactive Patient Education  2017 [employer].

## 2016-04-25 LAB — HEPATITIS C ANTIBODY: HCV AB: NEGATIVE

## 2016-04-25 LAB — WET PREP BY MOLECULAR PROBE
CANDIDA SPECIES: NOT DETECTED
GARDNERELLA VAGINALIS: NOT DETECTED
TRICHOMONAS VAG: NOT DETECTED

## 2016-04-25 LAB — STD PANEL
HIV 1&2 Ab, 4th Generation: NONREACTIVE
Hepatitis B Surface Ag: NEGATIVE

## 2016-04-25 LAB — GC/CHLAMYDIA PROBE AMP
CT Probe RNA: NOT DETECTED
GC Probe RNA: NOT DETECTED

## 2016-04-25 NOTE — Progress Notes (Signed)
Encounter reviewed. Please also make sure the patient is aware of plan B or Sarah Barry if the condom does break again Sumner Boast, MD

## 2016-05-08 ENCOUNTER — Encounter: Payer: Self-pay | Admitting: Certified Nurse Midwife

## 2016-05-08 DIAGNOSIS — R140 Abdominal distension (gaseous): Secondary | ICD-10-CM

## 2016-05-08 DIAGNOSIS — K219 Gastro-esophageal reflux disease without esophagitis: Secondary | ICD-10-CM

## 2016-05-21 ENCOUNTER — Encounter: Payer: Self-pay | Admitting: Gastroenterology

## 2016-05-22 ENCOUNTER — Ambulatory Visit (INDEPENDENT_AMBULATORY_CARE_PROVIDER_SITE_OTHER): Payer: BLUE CROSS/BLUE SHIELD | Admitting: Podiatry

## 2016-05-22 ENCOUNTER — Encounter: Payer: Self-pay | Admitting: Podiatry

## 2016-05-22 VITALS — Resp 16 | Ht 60.0 in | Wt 131.0 lb

## 2016-05-22 DIAGNOSIS — L608 Other nail disorders: Secondary | ICD-10-CM

## 2016-05-22 DIAGNOSIS — B351 Tinea unguium: Secondary | ICD-10-CM

## 2016-05-22 DIAGNOSIS — Z79899 Other long term (current) drug therapy: Secondary | ICD-10-CM | POA: Diagnosis not present

## 2016-05-22 DIAGNOSIS — M79609 Pain in unspecified limb: Secondary | ICD-10-CM

## 2016-05-22 DIAGNOSIS — L603 Nail dystrophy: Secondary | ICD-10-CM | POA: Diagnosis not present

## 2016-05-22 MED ORDER — CLOTRIMAZOLE-BETAMETHASONE 1-0.05 % EX CREA: 1.0000 "application " | TOPICAL_CREAM | Freq: Two times a day (BID) | CUTANEOUS | 1 refills | Status: DC

## 2016-05-22 MED ORDER — TERBINAFINE HCL 250 MG PO TABS: 250.0000 mg | ORAL_TABLET | Freq: Every day | ORAL | 0 refills | Status: DC

## 2016-05-24 NOTE — Progress Notes (Signed)
   Subjective: Patient presents today for possible treatment and evaluation of fungal nails bilaterally 1 through 5. Patient states that the nails have been discolored and thickened for greater than 1 month. She also complains of athlete's foot bilaterally. Patient presents today for further treatment and evaluation.  Objective: Physical Exam General: The patient is alert and oriented x3 in no acute distress.  Dermatology: Hyperkeratotic, discolored, thickened, onychodystrophy of nails noted bilaterally.  Skin is warm, dry and supple bilateral lower extremities. Negative for open lesions or macerations. Diffuse pruritus of skin noted to the bilateral weightbearing surfaces of the feet.  Vascular: Palpable pedal pulses bilaterally. No edema or erythema noted. Capillary refill within normal limits.  Neurological: Epicritic and protective threshold grossly intact bilaterally.   Musculoskeletal Exam: Range of motion within normal limits to all pedal and ankle joints bilateral. Muscle strength 5/5 in all groups bilateral.   Assessment: #1 onychodystrophy bilateral toenails #2 possible onychomycosis #3 hyperkeratotic nails bilateral #4 tinea pedis  Plan of Care:  #1 Patient was evaluated. #2 Orders for liver function tests were ordered today.  #3 Today nail biopsy was taken and sent to pathology for fungal culture. #4 Prescription for Lamisil #28 and Lotrisone cream #5 Patient is to return to clinic in 4 weeks to discuss fungal culture nail biopsy findings and LFT results and discuss different treatment options.   Edrick Kins, DPM [facility]  Dr. Edrick Kins, [address]                                        [place], [place] [address]                Office [phone]  Fax [phone]

## 2016-05-29 LAB — HEPATIC FUNCTION PANEL
ALBUMIN: 4 g/dL (ref 3.6–5.1)
ALT: 13 U/L (ref 6–29)
AST: 13 U/L (ref 10–35)
Alkaline Phosphatase: 65 U/L (ref 33–115)
BILIRUBIN DIRECT: 0.1 mg/dL (ref <=0.2)
BILIRUBIN TOTAL: 0.6 mg/dL (ref 0.2–1.2)
Indirect Bilirubin: 0.5 mg/dL (ref 0.2–1.2)
Total Protein: 6.2 g/dL (ref 6.1–8.1)

## 2016-06-19 ENCOUNTER — Ambulatory Visit: Payer: BLUE CROSS/BLUE SHIELD | Admitting: Podiatry

## 2016-06-19 ENCOUNTER — Ambulatory Visit (INDEPENDENT_AMBULATORY_CARE_PROVIDER_SITE_OTHER): Payer: BLUE CROSS/BLUE SHIELD | Admitting: Podiatry

## 2016-06-19 ENCOUNTER — Ambulatory Visit: Payer: Self-pay | Admitting: Gastroenterology

## 2016-06-19 DIAGNOSIS — B351 Tinea unguium: Secondary | ICD-10-CM | POA: Diagnosis not present

## 2016-06-19 DIAGNOSIS — L988 Other specified disorders of the skin and subcutaneous tissue: Secondary | ICD-10-CM

## 2016-06-19 DIAGNOSIS — L0889 Other specified local infections of the skin and subcutaneous tissue: Secondary | ICD-10-CM

## 2016-06-19 MED ORDER — TERBINAFINE HCL 250 MG PO TABS: 250.0000 mg | ORAL_TABLET | Freq: Every day | ORAL | 0 refills | Status: DC

## 2016-06-20 NOTE — Progress Notes (Signed)
   Subjective: Patient presents today for follow-up treatment and evaluation of fungal nails. Patient states that the nails have been discolored and thickened for greater than 1 month. She is here for the test results from the fungal testing. Patient presents today for further treatment and evaluation.  Objective: Physical Exam General: The patient is alert and oriented x3 in no acute distress.  Dermatology: Hyperkeratotic, discolored, thickened, onychodystrophy of nails noted.  Skin is warm, dry and supple bilateral lower extremities. Negative for open lesions or macerations.  Vascular: Palpable pedal pulses bilaterally. No edema or erythema noted. Capillary refill within normal limits.  Neurological: Epicritic and protective threshold grossly intact bilaterally.   Musculoskeletal Exam: Range of motion within normal limits to all pedal and ankle joints bilateral. Muscle strength 5/5 in all groups bilateral.   Assessment: #1 onychodystrophy bilateral toenails #2 possible onychomycosis #3 hyperkeratotic nails bilateral #4 pitted keratolysis bilateral feet  Plan of Care:  #1 Patient was evaluated. #2 fungal culture results were reviewed today. Patient is positive for fungus. Today we discussed multiple treatment options including topical antifungal, oral antifungal medication, and laser fungal treatment.  #3 liver function test results were reviewed.  #4 prescription for terbinafine 250 mg x 62 #5 prescription for antifungal nail lacquer through [facility] #6 Appt with Janett Billow for Laser treatment #7 Recommended Urea 40% cream #8 return to clinic in 6 months  Edrick Kins, DPM [facility]  Dr. Edrick Kins, [address]                                        [place], [place] [address]                Office [phone]  Fax [phone]

## 2016-06-21 MED ORDER — NONFORMULARY OR COMPOUNDED ITEM: 1.0000 [drp] | Freq: Every day | 2 refills | Status: DC

## 2016-06-26 ENCOUNTER — Ambulatory Visit: Payer: BLUE CROSS/BLUE SHIELD | Admitting: Podiatry

## 2016-09-03 ENCOUNTER — Other Ambulatory Visit: Payer: Self-pay | Admitting: Podiatry

## 2016-11-06 ENCOUNTER — Telehealth: Payer: Self-pay | Admitting: Certified Nurse Midwife

## 2016-11-06 ENCOUNTER — Ambulatory Visit: Payer: BLUE CROSS/BLUE SHIELD | Admitting: Certified Nurse Midwife

## 2016-11-06 NOTE — Telephone Encounter (Signed)
Patient cancelled appointment today because of a flat tire. Offered later appointment but declined. She will call back to reschedule.

## 2016-11-06 NOTE — Progress Notes (Deleted)
46 y.o. Single {Race/ethnicity:17218} female G0P0000 here with complaint of vaginal symptoms of itching, burning, and increase discharge. Describes discharge as ***. Onset of symptoms *** days ago. Denies new personal products or vaginal dryness. *** STD concerns. Urinary symptoms *** . Contraception is  ROS  O:Healthy female WDWN Affect: normal, orientation x 3  Exam: Abdomen: Lymph node: no enlargement or tenderness Pelvic exam: External genital: normal female BUS: negative Vagina: *** discharge noted. Ph:   ,Wet prep taken, Affirm taken Cervix: normal, non tender, no CMT Uterus: normal, non tender Adnexa:normal, non tender, no masses or fullness noted   Wet Prep results:   A:Normal pelvic exam   P:Discussed findings of *** and etiology. Discussed Aveeno or baking soda sitz bath for comfort. Avoid moist clothes or pads for extended period of time. If working out in gym clothes or swim suits for long periods of time change underwear or bottoms of swimsuit if possible. Coconut Oil use for skin protection prior to activity can be used to external skin for protection or dryness. Rx: ***  Rv prn

## 2016-11-07 ENCOUNTER — Telehealth: Payer: Self-pay | Admitting: Podiatry

## 2016-11-07 NOTE — Telephone Encounter (Signed)
Pt called wanting to speak to you directly. States she wants to ask some professional questions about some classes. Requested a call at [phone] when its convenient for you.

## 2016-11-14 NOTE — Telephone Encounter (Signed)
yes

## 2016-11-14 NOTE — Telephone Encounter (Signed)
Called patient to follow up to see if she needed to reschedule her problem visit that was cancelled. She is fine and will keep appointment for aex on 12/18/16.  Ok to close encounter?

## 2016-11-20 ENCOUNTER — Other Ambulatory Visit: Payer: Self-pay | Admitting: Certified Nurse Midwife

## 2016-11-20 DIAGNOSIS — A6000 Herpesviral infection of urogenital system, unspecified: Secondary | ICD-10-CM

## 2016-11-20 NOTE — Telephone Encounter (Signed)
Medication refill request: Valtrex  Last AEX:  09-21-15  Next AEX: 12-18-16  Last MMG (if hormonal medication request): 09-26-15 WNL  Refill authorized: please advise

## 2016-12-18 ENCOUNTER — Ambulatory Visit (INDEPENDENT_AMBULATORY_CARE_PROVIDER_SITE_OTHER): Payer: BLUE CROSS/BLUE SHIELD | Admitting: Certified Nurse Midwife

## 2016-12-18 ENCOUNTER — Encounter: Payer: Self-pay | Admitting: Certified Nurse Midwife

## 2016-12-18 VITALS — BP 100/62 | HR 64 | Resp 16 | Ht 59.75 in | Wt 131.0 lb

## 2016-12-18 DIAGNOSIS — R5383 Other fatigue: Secondary | ICD-10-CM | POA: Diagnosis not present

## 2016-12-18 DIAGNOSIS — Z0000 Encounter for general adult medical examination without abnormal findings: Secondary | ICD-10-CM | POA: Diagnosis not present

## 2016-12-18 DIAGNOSIS — Z01419 Encounter for gynecological examination (general) (routine) without abnormal findings: Secondary | ICD-10-CM | POA: Diagnosis not present

## 2016-12-18 DIAGNOSIS — N9089 Other specified noninflammatory disorders of vulva and perineum: Secondary | ICD-10-CM | POA: Diagnosis not present

## 2016-12-18 NOTE — Patient Instructions (Signed)

## 2016-12-18 NOTE — Progress Notes (Signed)
46 y.o. G0P0000 Single  Caucasian Fe here for annual exam. Periods normal, no issues. Taking Ampicillin for facial cystic acne and working well.  Sees PCP Dr. Jacelyn Grip prn. Uses Acyclovir as  Needed. Has had outbreaks in the last year, will need Rx refill. Has noted overwhelming fatigue throughout the day. Eats breakfast, but then sips bone broth until late evening. No insomnia, feels she sleeps well. Had history of anemia years ago, but none recent. Periods have not increased in amount or length. Also has two skin tags she would like frozen, they are in shaving line of vulva and irritating. No other health issues today.  Patient's last menstrual period was 12/12/2016.          Sexually active: Yes.    The current method of family planning is vasectomy.    Exercising: Yes.    work related Smoker:  no  Health Maintenance: Pap:  06-27-14 neg History of Abnormal Pap: yes MMG:  09-26-15 category c density birads 1:neg Self Breast exams: no Colonoscopy:  2014 benign polyps BMD:   No TDaP:  2013 Shingles: no Pneumonia: no Hep C and HIV: both neg 2018 Labs: If needed.   reports that she has quit smoking. Her smoking use included Cigarettes. She has a 13.00 pack-year smoking history. She has never used smokeless tobacco. She reports that she does not drink alcohol or use drugs.  Past Medical History:  Diagnosis Date  . Allergic rhinitis   . Anemia   . Bipolar disorder ([facility])   . Cancer (HCC)    skin ca - buttocks  . Chronic headache    during cycle  . Colon polyp 04/2012   tubular adenoma  . Depression   . Dysmenorrhea 05/19/2013  . Fatigue   . Fibroid   . Folliculitis   . GERD (gastroesophageal reflux disease)   . HSV infection   . Positive H. pylori test   . Thrombocytosis ([facility])   . Tongue disorder     Past Surgical History:  Procedure Laterality Date  . CHOLECYSTECTOMY  2000  . COLONOSCOPY  04/2012   tubular adenoma  . COLPOSCOPY  2014  . HYSTEROSCOPY W/D&C N/A 12/01/2013   Procedure: INSERTION OF SEPRAFILM;  Surgeon: Governor Specking, MD;  Location: [facility];  Service: Gynecology;  Laterality: N/A;  . MYOMECTOMY N/A 12/01/2013   Procedure: LAPAROSCOPY/GELPORT ASSISTED MYOMECTOMY/ADENOMYOMECTOMY;  Surgeon: Governor Specking, MD;  Location: [facility];  Service: Gynecology;  Laterality: N/A;  . SKIN CANCER EXCISION     buttock area, Dr. Marlou Starks    Current Outpatient Prescriptions  Medication Sig Dispense Refill  . acyclovir (ZOVIRAX) 200 MG capsule TAKE ONE CAPSULE BY MOUTH EVERY 4 HOURS WHILE  AWAKE 90 capsule 0  . ampicillin (PRINCIPEN) 500 MG capsule Take 500 mg by mouth. Twice daily as needed     No current facility-administered medications for this visit.     Family History  Problem Relation Age of Onset  . Colon cancer Maternal Grandfather   . Cancer Maternal Grandfather        colon cancer  . Depression Maternal Grandfather   . Diabetes Paternal Grandmother   . Alcohol abuse Father   . Heart disease Father 36       MI  . Hyperlipidemia Father   . Hypertension Father   . Depression Father   . Esophageal cancer Neg Hx   . Rectal cancer Neg Hx   . Stomach cancer Neg Hx   . Osteoporosis  Maternal Grandmother   . Dementia Mother     ROS:  Pertinent items are noted in HPI.  Otherwise, a comprehensive ROS was negative.  Exam:   BP 100/62   Pulse 64   Resp 16   Ht 4' 11.75" (1.518 m)   Wt 131 lb (59.4 kg)   LMP 12/12/2016   BMI 25.80 kg/m  Height: 4' 11.75" (151.8 cm) Ht Readings from Last 3 Encounters:  12/18/16 4' 11.75" (1.518 m)  05/22/16 5' (1.524 m)  04/24/16 4' 11.75" (1.518 m)    General appearance: alert, cooperative and appears stated age Head: Normocephalic, without obvious abnormality, atraumatic Neck: no adenopathy, supple, symmetrical, trachea midline and thyroid normal to inspection and palpation Lungs: clear to auscultation bilaterally Breasts: normal appearance, no masses or tenderness,  No nipple retraction or dimpling, No nipple discharge or bleeding, No axillary or supraclavicular adenopathy Heart: regular rate and rhythm Abdomen: soft, non-tender; no masses,  no organomegaly Extremities: extremities normal, atraumatic, no cyanosis or edema Skin: Skin color, texture, turgor normal. No rashes or lesions Lymph nodes: Cervical, supraclavicular, and axillary nodes normal. No abnormal inguinal nodes palpated Neurologic: Grossly normal   Pelvic: External genitalia:  no lesions, normal female, small skin tags noted on left perineal body area.               Urethra:  normal appearing urethra with no masses, tenderness or lesions              Bartholin's and Skene's: normal                 Vagina: normal appearing vagina with normal color and discharge, no lesions              Cervix: no cervical motion tenderness and no lesions              Pap taken: no  Bimanual Exam:  Uterus:  normal size, contour, position, consistency, mobility, non-tender              Adnexa: normal adnexa and no mass, fullness, tenderness               Rectovaginal: Confirms               Anus:  normal sphincter tone, no lesions  Chaperone present: yes  A:  Well Woman with normal exam  Contraception  Spouse vascetomy  Fatigue  Skin tags  Screening labs  Cystic acne under Dermatology care  Mammogram due, patient will schedule  P:   Reviewed health and wellness pertinent to exam  Discussed diet related fatigue and may need increase in calorie load with small frequent meals rather than one. Will check labs for anemia and Vitamin D which can also cause this  Will treat today per request today. Labs: Lipid , CMP, Vitamin D, CBC, TSH  Continue follow up with MD as indicated  Pap smear: no  counseled on breast self exam,  Stressed mammography screening, adequate intake of calcium and vitamin D, diet and exercise  return annually or prn  Procedure. Discussed risks/benefits of using cryo freeze  for skin tag destruction. Patient aware has used before with good success. Patient request treatment. Area below left vulva  identified and Ky jelly applied to healthy tissue for protection. Cryo Freeze applied for 30 sec with appropriate freeze ball effect noted on 2 very small skin tags. Patient tolerated procedure well . Expectations after treatment with appearance and slight irritation may be noted or  redness as the area scales off. Will advise if problems.  An After Visit Summary was printed and given to the patient.

## 2016-12-19 ENCOUNTER — Other Ambulatory Visit: Payer: Self-pay | Admitting: Certified Nurse Midwife

## 2016-12-19 DIAGNOSIS — R899 Unspecified abnormal finding in specimens from other organs, systems and tissues: Secondary | ICD-10-CM

## 2016-12-19 LAB — COMPREHENSIVE METABOLIC PANEL
ALBUMIN: 4.6 g/dL (ref 3.5–5.5)
ALK PHOS: 74 IU/L (ref 39–117)
ALT: 15 IU/L (ref 0–32)
AST: 14 IU/L (ref 0–40)
Albumin/Globulin Ratio: 2.2 (ref 1.2–2.2)
BUN/Creatinine Ratio: 18 (ref 9–23)
BUN: 15 mg/dL (ref 6–24)
Bilirubin Total: 0.7 mg/dL (ref 0.0–1.2)
CHLORIDE: 98 mmol/L (ref 96–106)
CO2: 26 mmol/L (ref 20–29)
Calcium: 9.8 mg/dL (ref 8.7–10.2)
Creatinine, Ser: 0.85 mg/dL (ref 0.57–1.00)
GFR calc non Af Amer: 82 mL/min/{1.73_m2} (ref >59)
GFR, EST AFRICAN AMERICAN: 94 mL/min/{1.73_m2} (ref >59)
GLOBULIN, TOTAL: 2.1 g/dL (ref 1.5–4.5)
GLUCOSE: 83 mg/dL (ref 65–99)
POTASSIUM: 4.2 mmol/L (ref 3.5–5.2)
Sodium: 139 mmol/L (ref 134–144)
Total Protein: 6.7 g/dL (ref 6.0–8.5)

## 2016-12-19 LAB — CBC
HEMOGLOBIN: 14.8 g/dL (ref 11.1–15.9)
Hematocrit: 44.5 % (ref 34.0–46.6)
MCH: 30.5 pg (ref 26.6–33.0)
MCHC: 33.3 g/dL (ref 31.5–35.7)
MCV: 92 fL (ref 79–97)
PLATELETS: 421 10*3/uL — AB (ref 150–379)
RBC: 4.86 x10E6/uL (ref 3.77–5.28)
RDW: 13.2 % (ref 12.3–15.4)
WBC: 7.9 10*3/uL (ref 3.4–10.8)

## 2016-12-19 LAB — LIPID PANEL
CHOLESTEROL TOTAL: 147 mg/dL (ref 100–199)
Chol/HDL Ratio: 2.8 ratio (ref 0.0–4.4)
HDL: 52 mg/dL (ref >39)
LDL Calculated: 79 mg/dL (ref 0–99)
Triglycerides: 80 mg/dL (ref 0–149)
VLDL CHOLESTEROL CAL: 16 mg/dL (ref 5–40)

## 2016-12-19 LAB — VITAMIN D 25 HYDROXY (VIT D DEFICIENCY, FRACTURES): VIT D 25 HYDROXY: 58.5 ng/mL (ref 30.0–100.0)

## 2016-12-19 LAB — TSH: TSH: 1.51 u[IU]/mL (ref 0.450–4.500)

## 2016-12-25 ENCOUNTER — Ambulatory Visit: Payer: BLUE CROSS/BLUE SHIELD | Admitting: Podiatry

## 2016-12-25 ENCOUNTER — Encounter: Payer: Self-pay | Admitting: Podiatry

## 2016-12-25 DIAGNOSIS — B351 Tinea unguium: Secondary | ICD-10-CM | POA: Diagnosis not present

## 2016-12-29 NOTE — Progress Notes (Signed)
   Subjective: Patient presents today for follow-up treatment and evaluation of fungal nails. She reports significant improvement in her symptoms. She reports some tenderness upon pressing the right great toenail. Patient presents today for further treatment and evaluation.   Past Medical History:  Diagnosis Date  . Abnormal Pap smear of cervix   . Allergic rhinitis   . Anemia   . Bipolar disorder ([facility])   . Cancer (HCC)    skin ca - buttocks  . Chronic headache    during cycle  . Colon polyp 04/2012   tubular adenoma  . Depression   . Dysmenorrhea 05/19/2013  . Fatigue   . Fibroid   . Folliculitis   . GERD (gastroesophageal reflux disease)   . HSV infection   . Positive H. pylori test   . Thrombocytosis ([facility])   . Tongue disorder     Objective: Physical Exam General: The patient is alert and oriented x3 in no acute distress.  Dermatology: Hyperkeratotic, discolored, thickened, onychodystrophy of right great toenail.  Skin is warm, dry and supple bilateral lower extremities. Negative for open lesions or macerations.  Vascular: Palpable pedal pulses bilaterally. No edema or erythema noted. Capillary refill within normal limits.  Neurological: Epicritic and protective threshold grossly intact bilaterally.   Musculoskeletal Exam: Range of motion within normal limits to all pedal and ankle joints bilateral. Muscle strength 5/5 in all groups bilateral.   Assessment: #1 onychomycosis right great toenail  Plan of Care:  #1 Patient was evaluated. #2 Discussed additional oral Lamisil dose if needed. #3 If patient would like to undergo another round of oral Lamisil, she can just call into the office and we will call in a refill for 90 days. #4 Continue wearing good shoe gear. #5 Return to clinic when necessary.    Edrick Kins, DPM [facility]  Dr. Edrick Kins, [address]                                        [place], [place] [address]                 Office [phone]  Fax [phone]

## 2017-02-05 ENCOUNTER — Other Ambulatory Visit (INDEPENDENT_AMBULATORY_CARE_PROVIDER_SITE_OTHER): Payer: BLUE CROSS/BLUE SHIELD

## 2017-02-05 DIAGNOSIS — R899 Unspecified abnormal finding in specimens from other organs, systems and tissues: Secondary | ICD-10-CM

## 2017-02-06 LAB — CBC WITH DIFFERENTIAL/PLATELET
BASOS ABS: 0 10*3/uL (ref 0.0–0.2)
BASOS: 1 %
EOS (ABSOLUTE): 0.2 10*3/uL (ref 0.0–0.4)
EOS: 2 %
Hematocrit: 43.1 % (ref 34.0–46.6)
Hemoglobin: 14.4 g/dL (ref 11.1–15.9)
Immature Grans (Abs): 0 10*3/uL (ref 0.0–0.1)
Immature Granulocytes: 0 %
Lymphocytes Absolute: 2.3 10*3/uL (ref 0.7–3.1)
Lymphs: 29 %
MCH: 29.8 pg (ref 26.6–33.0)
MCHC: 33.4 g/dL (ref 31.5–35.7)
MCV: 89 fL (ref 79–97)
MONOCYTES: 7 %
MONOS ABS: 0.6 10*3/uL (ref 0.1–0.9)
NEUTROS PCT: 61 %
Neutrophils Absolute: 4.7 10*3/uL (ref 1.4–7.0)
PLATELETS: 375 10*3/uL (ref 150–379)
RBC: 4.84 x10E6/uL (ref 3.77–5.28)
RDW: 14.5 % (ref 12.3–15.4)
WBC: 7.7 10*3/uL (ref 3.4–10.8)

## 2017-02-28 ENCOUNTER — Other Ambulatory Visit: Payer: Self-pay | Admitting: Gastroenterology

## 2017-02-28 DIAGNOSIS — R1084 Generalized abdominal pain: Secondary | ICD-10-CM

## 2017-03-03 ENCOUNTER — Other Ambulatory Visit: Payer: Self-pay | Admitting: Certified Nurse Midwife

## 2017-03-03 DIAGNOSIS — Z1231 Encounter for screening mammogram for malignant neoplasm of breast: Secondary | ICD-10-CM

## 2017-03-12 ENCOUNTER — Other Ambulatory Visit: Payer: Self-pay

## 2017-03-26 ENCOUNTER — Ambulatory Visit: Payer: Self-pay

## 2017-04-26 ENCOUNTER — Encounter: Payer: Self-pay | Admitting: Gastroenterology

## 2017-05-28 ENCOUNTER — Other Ambulatory Visit: Payer: Self-pay | Admitting: Certified Nurse Midwife

## 2017-05-28 DIAGNOSIS — A6000 Herpesviral infection of urogenital system, unspecified: Secondary | ICD-10-CM

## 2017-05-28 NOTE — Telephone Encounter (Signed)
Medication refill request: acyclovir  Last AEX:  12-18-16  Next AEX: not scheduled  Last MMG (if hormonal medication request): 09-26-15 WNL  Refill authorized: please advise

## 2017-06-15 ENCOUNTER — Encounter: Payer: Self-pay | Admitting: Certified Nurse Midwife

## 2017-06-16 ENCOUNTER — Telehealth: Payer: Self-pay | Admitting: Certified Nurse Midwife

## 2017-06-16 NOTE — Telephone Encounter (Signed)
-----   Message from [place], Generic sent at 06/15/2017 7:19 PM EDT -----    I need a new appointment for a mammogram.   Thank you

## 2017-06-16 NOTE — Telephone Encounter (Signed)
Left message to call Joncarlos Atkison at [phone].  

## 2017-06-16 NOTE — Telephone Encounter (Signed)
-----   Message from [place], Generic sent at 06/15/2017 7:12 PM EDT -----    Having the herpes virus I don't take the meds unless I'm feeling symptoms. Would I still be able to give the herpes virus to my boyfriend even when I'm not having symptoms or an out break?

## 2017-06-24 ENCOUNTER — Encounter: Payer: Self-pay | Admitting: Podiatry

## 2017-06-26 MED ORDER — TERBINAFINE HCL 250 MG PO TABS: 250.0000 mg | ORAL_TABLET | Freq: Every day | ORAL | 0 refills | Status: DC

## 2017-06-26 NOTE — Telephone Encounter (Signed)
Informed pt another #30 lamisil had been sent to her pharmacy.

## 2017-06-27 NOTE — Telephone Encounter (Signed)
Spoke with patient.   1. RN will call to schedule screening MMG and notify patient of MMG appointment via [facility], per patient request.    2. Advised patient HSV can be still be transmitted even when not experiencing outbreak or symptoms. Reviewed ways to prevent/reduce risk of spread of HSV.   Patient agreeable and verbalizes understanding.

## 2017-06-27 NOTE — Telephone Encounter (Signed)
Patient scheduled for screening MMG at [facility] on 07/25/17 at 11:10am , arriving at 10:50am.    MyChart message to patient advising of appointment details as requested.   Routing to provider for final review. Patient is agreeable to disposition. Will close encounter.

## 2017-07-05 ENCOUNTER — Encounter: Payer: Self-pay | Admitting: Certified Nurse Midwife

## 2017-07-07 ENCOUNTER — Telehealth: Payer: Self-pay | Admitting: Certified Nurse Midwife

## 2017-07-07 NOTE — Telephone Encounter (Signed)
Patient sent the following correspondence through [facility]. Routing to triage to assist patient with request.  ----- Message from [place], Generic sent at 07/05/2017 7:20 PM EDT -----    My chart is saying I'm past due for a pap smear. Didn't I have one Oct. 2018?

## 2017-07-12 ENCOUNTER — Encounter: Payer: Self-pay | Admitting: Podiatry

## 2017-07-25 ENCOUNTER — Ambulatory Visit
Admission: RE | Admit: 2017-07-25 | Discharge: 2017-07-25 | Disposition: A | Discharge status: Home / Self Care | Payer: BLUE CROSS/BLUE SHIELD | Source: Ambulatory Visit | Attending: Certified Nurse Midwife | Admitting: Certified Nurse Midwife

## 2017-07-25 DIAGNOSIS — Z1231 Encounter for screening mammogram for malignant neoplasm of breast: Secondary | ICD-10-CM

## 2017-07-28 ENCOUNTER — Encounter: Payer: Self-pay | Admitting: Podiatry

## 2017-07-31 ENCOUNTER — Other Ambulatory Visit: Payer: Self-pay | Admitting: Podiatry

## 2017-07-31 DIAGNOSIS — Z79899 Other long term (current) drug therapy: Secondary | ICD-10-CM

## 2017-08-01 ENCOUNTER — Ambulatory Visit: Payer: BLUE CROSS/BLUE SHIELD | Admitting: Certified Nurse Midwife

## 2017-08-04 NOTE — Telephone Encounter (Signed)
Emailed notice to have hepatic function performed prior to beginning the lamisil.

## 2017-08-11 ENCOUNTER — Telehealth: Payer: Self-pay | Admitting: Certified Nurse Midwife

## 2017-08-11 ENCOUNTER — Encounter: Payer: Self-pay | Admitting: Certified Nurse Midwife

## 2017-08-11 NOTE — Telephone Encounter (Signed)
Spoke with patient. Patient states she has had warts removed with Melvia Heaps CNM before. Feels she may have one at this time and would like to be seen for removal. Unable to see the area. Advised will need to be seen to have the area evaluated. Patient is unable to schedule at this time as she is in school. Will return call to schedule.  Routing to provider for final review. Patient agreeable to disposition. Will close encounter.

## 2017-08-11 NOTE — Telephone Encounter (Signed)
Left message to call Kaitlyn at [phone]. 

## 2017-08-11 NOTE — Telephone Encounter (Signed)
Patient sent the following message through [facility]. Routing to triage to assist patient with request.   ----- Message from [place], Generic sent at 08/11/2017 12:34 PM EDT -----    I need an appointment to have a wart removed on my labia. Anyday /anytime  The 1st week in July.     Thank you

## 2017-08-18 ENCOUNTER — Encounter: Payer: Self-pay | Admitting: Podiatry

## 2017-09-01 ENCOUNTER — Encounter: Payer: Self-pay | Admitting: Podiatry

## 2017-09-05 NOTE — Telephone Encounter (Signed)
Hi Valery! Check with Horris Latino or Amy to see if this is permissible and let me know. Since she is a patient, I'm not sure of [facility]'s policy.

## 2017-09-08 ENCOUNTER — Telehealth: Payer: Self-pay | Admitting: Certified Nurse Midwife

## 2017-09-08 NOTE — Telephone Encounter (Signed)
Patient would like an appointment for a skin tag removal.

## 2017-09-08 NOTE — Telephone Encounter (Signed)
Spoke with patient. Feels a lump on right side just inside vaginal opening, unable to visualize. Has hx of HSV and skin cancer. requesting removal. Denies any other gyn symptoms.   Advised OV needed first for further evaluation. Patient states she can only schedule on Friday afternoons. OV scheduled for 09/12/17 at 4pm.  Routing to provider for final review. Patient is agreeable to disposition. Will close encounter.

## 2017-09-12 ENCOUNTER — Ambulatory Visit: Payer: BLUE CROSS/BLUE SHIELD | Admitting: Certified Nurse Midwife

## 2017-09-12 ENCOUNTER — Other Ambulatory Visit: Payer: Self-pay

## 2017-09-12 ENCOUNTER — Encounter: Payer: Self-pay | Admitting: Certified Nurse Midwife

## 2017-09-12 VITALS — BP 98/60 | HR 60 | Resp 16 | Ht 59.75 in | Wt 140.0 lb

## 2017-09-12 DIAGNOSIS — Z8619 Personal history of other infectious and parasitic diseases: Secondary | ICD-10-CM | POA: Diagnosis not present

## 2017-09-12 DIAGNOSIS — N9089 Other specified noninflammatory disorders of vulva and perineum: Secondary | ICD-10-CM

## 2017-09-12 DIAGNOSIS — A630 Anogenital (venereal) warts: Secondary | ICD-10-CM

## 2017-09-12 NOTE — Progress Notes (Signed)
  Subjective:     Patient ID: Sarah Barry, female   DOB: September 03, 1970, 47 y.o.   MRN: [id number]  Patient here for evaluation of possible skin tag or genital wart in vulva area. Noted about one month. No tenderness, or bleeding or discharge from area. Denies any new products in vaginal area. History of genital warts and treatment in past with TCA with good results. Has been having hair waxes and feels this may have started this. Patient would like treated if present to prevent more occurrence. No other health concerns today.    Review of Systems  Constitutional: Negative.   Genitourinary: Negative for genital sores, pelvic pain, vaginal bleeding, vaginal discharge and vaginal pain.       Skin tag or wart noted  Psychiatric/Behavioral: The patient is not nervous/anxious.        Objective:   Physical Exam  Constitutional: She appears well-developed and well-nourished.  Genitourinary:    Pelvic exam was performed with patient supine. There is lesion on the right labia. There is no rash or tenderness on the right labia. There is lesion on the left labia. There is no rash, tenderness or injury on the left labia.  Lymphadenopathy: No inguinal adenopathy noted on the right or left side.  Skin: Skin is warm and dry.  Psychiatric: She has a normal mood and affect. Her behavior is normal. Judgment and thought content normal.       Assessment:     Genital wart on right perineal area  Flat Skin tag on left perineal area    Plan:     Discussed finding with patient of genital wart and small skin tags noted. Discussed no treatment need for skin tag, very small and not unusual appearance noted. Discussed treatment with TCA which she has has before with previous warts. Risks/benefits/and side effects of slight burning when used. Risk of not totally resolving due to etiology of warts. Patient verbalized understanding and request treatment.  Procedure: KY Jelly applied to healthy skin around  genital wart. TCA applied until acetowhite appearance noted and base area covered. Surrounding skin protected from any dripping. Patient tolerated well. Instructions given to avoid washing area for the next 24 hours and then can bathe as usual. Pulling or tugging on area. Expectation of shedding of wart and resolution. Questions addressed. Patient will return if does not resolve.  Rv prn, as above

## 2017-09-12 NOTE — Patient Instructions (Signed)
Genital Warts Genital warts are a common STD (sexually transmitted disease). They may appear as small bumps on the tissues of the genital area or anal area. Sometimes, they can become irritated and cause pain. Genital warts are easily passed to other people through sexual contact. Getting treatment is important because genital warts can lead to other problems. In females, the virus that causes genital warts may increase the risk of cervical cancer. What are the causes? Genital warts are caused by a virus that is called human papillomavirus (HPV). HPV is spread by having unprotected sex with an infected person. It can be spread through vaginal, anal, and oral sex. Many people do not know that they are infected. They may be infected for years without problems. However, even if they do not have problems, they can pass the infection to their sexual partners. What increases the risk? Genital warts are more likely to develop in:  People who have unprotected sex.  People who have multiple sexual partners.  People who become sexually active before they are 48 years of age.  Men who are not circumcised.  Women who have a female sexual partner who is not circumcised.  People who have a weakened body defense system (immune system) due to disease or medicine.  People who smoke.  What are the signs or symptoms? Symptoms of genital warts include:  Small growths in the genital area or anal area. These warts often grow in clusters.  Itching and irritation in the genital area or anal area.  Bleeding from the warts.  Painful sexual intercourse.  How is this diagnosed? Genital warts can usually be diagnosed from their appearance on the vagina, vulva, penis, perineum, anus, or rectum. Tests may also be done, such as:  Biopsy. A tissue sample is removed so it can be looked at under a microscope.  Colposcopy. In females, a magnifying tool is used to examine the vagina and cervix. Certain solutions may  be used to make the HPV cells change color so they can be seen more easily.  A Pap test in females.  Tests for other STDs.  How is this treated? Treatment for genital warts may include:  Applying prescription medicines to the warts. These may be solutions or creams.  Freezing the warts with liquid nitrogen (cryotherapy).  Burning the warts with: ? Laser treatment. ? An electrified probe (electrocautery).  Injecting a substance (Candida antigen or Trichophyton antigen) into the warts to help the body's immune system to fight off the warts.  Interferon injections.  Surgery to remove the warts.  Follow these instructions at home: Medicines  Apply over-the-counter and prescription medicines only as told by your health care provider.  Do not treat genital warts with medicines that are used for treating hand warts.  Talk with your health care provider about using over-the-counter anti-itch creams. General instructions  Do not touch or scratch the warts.  Do not have sex until your treatment has been completed.  Tell your current and past sexual partners about your condition because they may also need treatment.  Keep all follow-up visits as told by your health care provider. This is important.  After treatment, use condoms during sex to prevent future infections. Other Instructions for Women  Women who have genital warts might need increased screening for cervical cancer. This type of cancer is slow growing and can be cured if it is found early. Chances of developing cervical cancer are increased with HPV.  If you become pregnant, tell your health   care provider that you have had HPV. Your health care provider will monitor you closely during pregnancy to be sure that your baby is safe. How is this prevented? Talk with your health care provider about getting the HPV vaccines. These vaccines prevent some HPV infections and cancers. It is recommended that the vaccine be given to  males and females who are 9-26 years of age. It will not work if you already have HPV, and it is not recommended for pregnant women. Contact a health care provider if:  You have redness, swelling, or pain in the area of the treated skin.  You have a fever.  You feel generally ill.  You feel lumps in and around your genital area or anal area.  You have bleeding in your genital area or anal area.  You have pain during sexual intercourse. This information is not intended to replace advice given to you by your health care provider. Make sure you discuss any questions you have with your health care provider. Document Released: 02/02/2000 Document Revised: 07/13/2015 Document Reviewed: 05/02/2014 Elsevier Interactive Patient Education  2018 Elsevier Inc.  

## 2017-10-27 ENCOUNTER — Other Ambulatory Visit: Payer: Self-pay | Admitting: Certified Nurse Midwife

## 2017-10-27 ENCOUNTER — Telehealth: Payer: Self-pay | Admitting: Certified Nurse Midwife

## 2017-10-27 MED ORDER — ACYCLOVIR 400 MG PO TABS: 400.0000 mg | ORAL_TABLET | Freq: Every day | ORAL | 1 refills | Status: DC

## 2017-10-27 NOTE — Telephone Encounter (Signed)
For suppression 400 mg once daily, treatment regimen is every 4 hours while a wake, usually this is qid for 3 days Disp 45 refill x 1 . She needs to advise if problems with this dosage regimen.

## 2017-10-27 NOTE — Telephone Encounter (Signed)
I do not have medical insurance at this time and was wondering if Dr. Hollice Espy can call in Acyclovir 400mg  60 quantity to [facility] on [address]. Using [facility] it would be $15 but has to be that mg and quanity. Thank you

## 2017-10-27 NOTE — Telephone Encounter (Signed)
Left message to call Sharee Pimple at [phone].   Rx sent, review dosing instructions per Melvia Heaps, CNM.   Acyclovir 400 mg tab po daily, increased to q 4hrs while awake x 3 days for outbreak. #45/1RF  Cost with Good RX coupon at [facility] for 45 day supply: $13.09

## 2017-10-27 NOTE — Telephone Encounter (Signed)
Spoke with patient. Confirmed pharmacy. Requesting RX for Acyclovir 400 mg PO. Previously prescribed Acyclovir 200mg  caps. Patient recommended so she can use Good RX coupon for savings. No current outbreak, Hx of Hsv.   Advised will review with Melvia Heaps, CNM and return call.   Last AEX 12/18/16 Next 12/31/17  Melvia Heaps, CNM -please advise.

## 2017-10-27 NOTE — Telephone Encounter (Signed)
agreeable

## 2017-10-27 NOTE — Telephone Encounter (Signed)
Melvia Heaps, CNM- please advise on Acyclovir 400mg  dosage instruction and dispensing amt and refills.

## 2017-10-29 NOTE — Telephone Encounter (Signed)
Call to patient, left detailed message, ok per dpr. Advised of Rx instructions as seen below per Melvia Heaps, CNM. Advised of approximate cost with Good RX coupon, Rx to [facility] on 9/9. Return call to office of any additional questions. Encounter closed.

## 2017-12-31 ENCOUNTER — Ambulatory Visit: Payer: BLUE CROSS/BLUE SHIELD | Admitting: Certified Nurse Midwife

## 2018-01-28 ENCOUNTER — Other Ambulatory Visit: Payer: Self-pay

## 2018-01-28 DIAGNOSIS — B009 Herpesviral infection, unspecified: Secondary | ICD-10-CM

## 2018-01-28 NOTE — Telephone Encounter (Signed)
Medication refill request: Acyclovir 400mg   Last AEX:  12/18/16 Next AEX: nothing scheduled at this time Last MMG (if hormonal medication request): 07/25/17 Bi-rads 1 Neg  Refill authorized: #45 with 1RF

## 2018-01-29 MED ORDER — ACYCLOVIR 400 MG PO TABS: 400.0000 mg | ORAL_TABLET | Freq: Every day | ORAL | 9 refills | Status: DC

## 2018-06-09 ENCOUNTER — Other Ambulatory Visit: Payer: Self-pay | Admitting: Gastroenterology

## 2018-06-09 DIAGNOSIS — R1084 Generalized abdominal pain: Secondary | ICD-10-CM

## 2018-06-15 ENCOUNTER — Ambulatory Visit
Admission: RE | Admit: 2018-06-15 | Discharge: 2018-06-15 | Disposition: A | Discharge status: Home / Self Care | Payer: BLUE CROSS/BLUE SHIELD | Source: Ambulatory Visit | Attending: Gastroenterology | Admitting: Gastroenterology

## 2018-06-15 ENCOUNTER — Other Ambulatory Visit: Payer: Self-pay

## 2018-06-15 DIAGNOSIS — R1084 Generalized abdominal pain: Secondary | ICD-10-CM

## 2018-06-15 MED ORDER — IOPAMIDOL (ISOVUE-300) INJECTION 61%
100.0000 mL | Freq: Once | INTRAVENOUS | Status: AC | PRN
  Administered 2018-06-15: 100 mL via INTRAVENOUS

## 2018-08-12 ENCOUNTER — Other Ambulatory Visit: Payer: Self-pay | Admitting: Gastroenterology

## 2018-09-14 ENCOUNTER — Other Ambulatory Visit (HOSPITAL_COMMUNITY)
Admission: RE | Admit: 2018-09-14 | Discharge: 2018-09-14 | Disposition: A | Discharge status: Home / Self Care | Payer: HRSA Program | Source: Ambulatory Visit | Attending: Gastroenterology | Admitting: Gastroenterology

## 2018-09-14 DIAGNOSIS — Z20828 Contact with and (suspected) exposure to other viral communicable diseases: Secondary | ICD-10-CM | POA: Diagnosis present

## 2018-09-14 LAB — SARS CORONAVIRUS 2 (TAT 6-24 HRS): SARS Coronavirus 2: NEGATIVE

## 2018-09-15 ENCOUNTER — Other Ambulatory Visit: Payer: Self-pay

## 2018-09-15 ENCOUNTER — Other Ambulatory Visit: Payer: Self-pay | Admitting: Family Medicine

## 2018-09-15 ENCOUNTER — Ambulatory Visit
Admission: RE | Admit: 2018-09-15 | Discharge: 2018-09-15 | Disposition: A | Discharge status: Home / Self Care | Payer: No Typology Code available for payment source | Source: Ambulatory Visit | Attending: Family Medicine | Admitting: Family Medicine

## 2018-09-15 DIAGNOSIS — M5412 Radiculopathy, cervical region: Secondary | ICD-10-CM

## 2018-09-17 ENCOUNTER — Ambulatory Visit (HOSPITAL_COMMUNITY)
Admission: RE | Admit: 2018-09-17 | Payer: No Typology Code available for payment source | Source: Home / Self Care | Admitting: Gastroenterology

## 2018-09-17 ENCOUNTER — Encounter (HOSPITAL_COMMUNITY): Admission: RE | Payer: Self-pay | Source: Home / Self Care

## 2018-09-17 SURGERY — COLONOSCOPY WITH PROPOFOL
Anesthesia: Monitor Anesthesia Care

## 2019-03-09 ENCOUNTER — Ambulatory Visit: Payer: 59 | Attending: Internal Medicine

## 2019-03-09 DIAGNOSIS — Z20822 Contact with and (suspected) exposure to covid-19: Secondary | ICD-10-CM

## 2019-03-10 LAB — NOVEL CORONAVIRUS, NAA: SARS-CoV-2, NAA: NOT DETECTED

## 2019-03-17 ENCOUNTER — Telehealth: Payer: Self-pay | Admitting: Certified Nurse Midwife

## 2019-03-17 NOTE — Telephone Encounter (Signed)
Patient is having vaginal pain. 

## 2019-03-17 NOTE — Telephone Encounter (Signed)
Left message for pt to call to triage RN.

## 2019-03-18 NOTE — Telephone Encounter (Signed)
Spoke to pt. Pt states having lower abd pain.  Pt says was worse a few days ago and now better today. Pt has AEX on 03/24/2019. Pt had CT scan that revealed a possible uterine fibroid. Pt denies pain at this time, vaginal bleeding or discharge or odor. Pt aware of pap smear and breast exam at [facility]. Pt also wanting to know about perineum skin tag removal at [facility]. Will discuss with Debbi, CNM at [facility]. Pt agreeable.   Routing to D. Hollice Espy, CNM for review and will close encounter.

## 2019-03-23 ENCOUNTER — Other Ambulatory Visit: Payer: Self-pay

## 2019-03-24 ENCOUNTER — Encounter: Payer: Self-pay | Admitting: Certified Nurse Midwife

## 2019-03-24 ENCOUNTER — Other Ambulatory Visit (HOSPITAL_COMMUNITY)
Admission: RE | Admit: 2019-03-24 | Discharge: 2019-03-24 | Disposition: A | Discharge status: Home / Self Care | Payer: 59 | Source: Ambulatory Visit | Attending: Certified Nurse Midwife | Admitting: Certified Nurse Midwife

## 2019-03-24 ENCOUNTER — Ambulatory Visit (INDEPENDENT_AMBULATORY_CARE_PROVIDER_SITE_OTHER): Payer: 59 | Admitting: Certified Nurse Midwife

## 2019-03-24 ENCOUNTER — Other Ambulatory Visit: Payer: Self-pay

## 2019-03-24 VITALS — BP 102/68 | HR 72 | Temp 98.2°F | Resp 12 | Ht 59.75 in | Wt 134.0 lb

## 2019-03-24 DIAGNOSIS — Z124 Encounter for screening for malignant neoplasm of cervix: Secondary | ICD-10-CM | POA: Insufficient documentation

## 2019-03-24 DIAGNOSIS — E559 Vitamin D deficiency, unspecified: Secondary | ICD-10-CM | POA: Diagnosis not present

## 2019-03-24 DIAGNOSIS — Z01419 Encounter for gynecological examination (general) (routine) without abnormal findings: Secondary | ICD-10-CM | POA: Diagnosis present

## 2019-03-24 DIAGNOSIS — A630 Anogenital (venereal) warts: Secondary | ICD-10-CM | POA: Diagnosis not present

## 2019-03-24 DIAGNOSIS — Z0000 Encounter for general adult medical examination without abnormal findings: Secondary | ICD-10-CM | POA: Diagnosis not present

## 2019-03-24 NOTE — Progress Notes (Signed)
49 y.o. G0P0000 Single  Caucasian Fe here for annual exam. Periods normal, duration 2-3 days which is lighter. Occasional hot flashes. Genital warts have been off and on, currently present and would like treated today if possible.. No genital HSV recently. Continues with pain  issues and needs evaluation.  Had CT scan with lobular uterus noted on report, did not follow up with, no history of fibroid  that patient is aware.Sexually active,no partner change no STD screening needed, condom use. No other health issues today.  No LMP recorded.          Sexually active: Yes.    The current method of family planning condoms  Exercising: Yes.    at work Smoker:  no  Review of Systems  Constitutional: Negative.   HENT: Negative.   Eyes: Negative.   Respiratory: Negative.   Cardiovascular: Negative.   Gastrointestinal: Negative.   Genitourinary: Negative.   Musculoskeletal: Negative.   Skin:       Skin tags  Neurological: Negative.   Endo/Heme/Allergies: Negative.   Psychiatric/Behavioral: Negative.     Health Maintenance: Pap:  06-27-14 neg History of Abnormal Pap: yes MMG:  07-28-17 category c density birads 1:neg Self Breast exams: no Colonoscopy:  2014 benign polyps BMD:   none TDaP:  2013 Shingles: no Pneumonia: no Hep C and HIV: both neg 2018 Labs: yes   reports that she has quit smoking. Her smoking use included cigarettes. She has a 13.00 pack-year smoking history. She has never used smokeless tobacco. She reports that she does not drink alcohol or use drugs.  Past Medical History:  Diagnosis Date  . Abnormal Pap smear of cervix   . Allergic rhinitis   . Anemia   . Bipolar disorder ([place])   . Cancer ([facility])    skin ca - buttocks  . Chronic headache    during cycle  . Colon polyp 04/2012   tubular adenoma  . Depression   . Dysmenorrhea 05/19/2013  . Fatigue   . Fibroid   . Folliculitis   . GERD (gastroesophageal reflux disease)   . HSV infection   . Positive H. pylori  test   . STD (sexually transmitted disease)    genital warts  . Thrombocytosis ([place])   . Tongue disorder     Past Surgical History:  Procedure Laterality Date  . CHOLECYSTECTOMY  2000  . COLONOSCOPY  04/2012   tubular adenoma  . COLPOSCOPY  2014  . HYSTEROSCOPY WITH D & C N/A 12/01/2013   Procedure: INSERTION OF SEPRAFILM;  Surgeon: Governor Specking, MD;  Location: [facility];  Service: Gynecology;  Laterality: N/A;  . MYOMECTOMY N/A 12/01/2013   Procedure: LAPAROSCOPY/GELPORT ASSISTED MYOMECTOMY/ADENOMYOMECTOMY;  Surgeon: Governor Specking, MD;  Location: [facility];  Service: Gynecology;  Laterality: N/A;  . SKIN CANCER EXCISION     buttock area, Dr. Marlou Starks    Current Outpatient Medications  Medication Sig Dispense Refill  . acetaminophen (TYLENOL) 500 MG tablet Take 500 mg by mouth daily as needed for moderate pain.    Marland Kitchen ampicillin (PRINCIPEN) 500 MG capsule Take 500 mg by mouth 2 (two) times daily as needed (acne).     Marland Kitchen ibuprofen (ADVIL) 200 MG tablet Take 600 mg by mouth daily as needed for headache or moderate pain.    . Misc Natural Products (COLON CLEANSE) CAPS Take 2 capsules by mouth 3 (three) times a week. Take at night    . omeprazole (PRILOSEC) 40 MG capsule Take 40  mg by mouth daily.    Marland Kitchen terbinafine (LAMISIL) 250 MG tablet TAKE 1 TABLET BY MOUTH ONCE DAILY (Patient not taking: Reported on 09/15/2018) 60 tablet 0   No current facility-administered medications for this visit.    Family History  Problem Relation Age of Onset  . Colon cancer Maternal Grandfather   . Cancer Maternal Grandfather        colon cancer  . Depression Maternal Grandfather   . Diabetes Paternal Grandmother   . Alcohol abuse Father   . Heart disease Father 5       MI  . Hyperlipidemia Father   . Hypertension Father   . Depression Father   . Osteoporosis Maternal Grandmother   . Dementia Mother   . Esophageal cancer Neg Hx   . Rectal cancer Neg Hx   .  Stomach cancer Neg Hx     ROS:  Pertinent items are noted in HPI.  Otherwise, a comprehensive ROS was negative.  Exam:   There were no vitals taken for this visit.   Ht Readings from Last 3 Encounters:  09/12/17 4' 11.75" (1.518 m)  12/18/16 4' 11.75" (1.518 m)  05/22/16 5' (1.524 m)    General appearance: alert, cooperative and appears stated age Head: Normocephalic, without obvious abnormality, atraumatic Neck: no adenopathy, supple, symmetrical, trachea midline and thyroid normal to inspection and palpation Lungs: clear to auscultation bilaterally Breasts: normal appearance, no masses or tenderness, No nipple retraction or dimpling, No nipple discharge or bleeding, No axillary or supraclavicular adenopathy Heart: regular rate and rhythm Abdomen: soft, non-tender; no masses,  no organomegaly Extremities: extremities normal, atraumatic, no cyanosis or edema Skin: Skin color, texture, turgor normal. No rashes or lesions Lymph nodes: Cervical, supraclavicular, and axillary nodes normal. No abnormal inguinal nodes palpated Neurologic: Grossly normal   Pelvic: External genitalia:  3 genital warts noted on left perineal area, KY  jelly applied to protect skin and TCA applied to each wart with acetowhite noted. Patient tolerated well. Physical Exam Genitourinary:                   Urethra:  normal appearing urethra with no masses, tenderness or lesions              Bartholin's and Skene's: normal                 Vagina: normal appearing vagina with normal color and discharge, no lesions              Cervix: no cervical motion tenderness, no lesions and normal appearance              Pap taken: Yes.   Bimanual Exam:  Uterus:  non tender, contour irregular feel, mobile, mid position              Adnexa: normal adnexa and no mass, fullness, tenderness               Rectovaginal: Confirms               Anus:  normal sphincter tone, no lesions  Chaperone present: yes  A:  Well  Woman with normal exam  Contraception condoms  Lobular uterus noted on CT scan, surgical history of myomectomy/adenomyomectomy (patient does not remember this)  Abdominal pain with eating has not seen GI as recommended  Mammogram due  Genital wart history request treatment with TCA  Screening labs       P:   Reviewed health and wellness  pertinent to exam  Stressed consistent use for best protection  Discussed due to CT history PUS should be done here for evaluation. She will be called to schedule. If normal, then will refer to Dr. Collene Mares for evaluation  Patient will schedule mammogram  Discussed risks/benefits/warning signs with use of TCA. Patient request same. See note of treatment.  Labs: CBC,Lipid panel, TSH, CMP, Vitamin D  Pap smear: yes   counseled on breast self exam, mammography screening, STD prevention, feminine hygiene, adequate intake of calcium and vitamin D, diet and exercise  return annually or prn  An After Visit Summary was printed and given to the patient.

## 2019-03-24 NOTE — Patient Instructions (Signed)

## 2019-03-25 ENCOUNTER — Telehealth: Payer: Self-pay | Admitting: *Deleted

## 2019-03-25 ENCOUNTER — Telehealth: Payer: Self-pay | Admitting: Obstetrics and Gynecology

## 2019-03-25 DIAGNOSIS — R9389 Abnormal findings on diagnostic imaging of other specified body structures: Secondary | ICD-10-CM

## 2019-03-25 LAB — COMPREHENSIVE METABOLIC PANEL
ALT: 12 IU/L (ref 0–32)
AST: 12 IU/L (ref 0–40)
Albumin/Globulin Ratio: 1.7 (ref 1.2–2.2)
Albumin: 4 g/dL (ref 3.8–4.8)
Alkaline Phosphatase: 57 IU/L (ref 39–117)
BUN/Creatinine Ratio: 33 — ABNORMAL HIGH (ref 9–23)
BUN: 19 mg/dL (ref 6–24)
Bilirubin Total: 0.2 mg/dL (ref 0.0–1.2)
CO2: 21 mmol/L (ref 20–29)
Calcium: 8.9 mg/dL (ref 8.7–10.2)
Chloride: 105 mmol/L (ref 96–106)
Creatinine, Ser: 0.57 mg/dL (ref 0.57–1.00)
GFR calc Af Amer: 126 mL/min/{1.73_m2} (ref >59)
GFR calc non Af Amer: 109 mL/min/{1.73_m2} (ref >59)
Globulin, Total: 2.3 g/dL (ref 1.5–4.5)
Glucose: 106 mg/dL — ABNORMAL HIGH (ref 65–99)
Potassium: 4.5 mmol/L (ref 3.5–5.2)
Sodium: 138 mmol/L (ref 134–144)
Total Protein: 6.3 g/dL (ref 6.0–8.5)

## 2019-03-25 LAB — CYTOLOGY - PAP
Comment: NEGATIVE
Diagnosis: NEGATIVE
High risk HPV: NEGATIVE

## 2019-03-25 LAB — CBC
Hematocrit: 37.3 % (ref 34.0–46.6)
Hemoglobin: 12.9 g/dL (ref 11.1–15.9)
MCH: 29.5 pg (ref 26.6–33.0)
MCHC: 34.6 g/dL (ref 31.5–35.7)
MCV: 85 fL (ref 79–97)
Platelets: 352 10*3/uL (ref 150–450)
RBC: 4.37 x10E6/uL (ref 3.77–5.28)
RDW: 14.3 % (ref 11.7–15.4)
WBC: 10 10*3/uL (ref 3.4–10.8)

## 2019-03-25 LAB — LIPID PANEL
Chol/HDL Ratio: 3.6 ratio (ref 0.0–4.4)
Cholesterol, Total: 151 mg/dL (ref 100–199)
HDL: 42 mg/dL (ref >39)
LDL Chol Calc (NIH): 72 mg/dL (ref 0–99)
Triglycerides: 228 mg/dL — ABNORMAL HIGH (ref 0–149)
VLDL Cholesterol Cal: 37 mg/dL (ref 5–40)

## 2019-03-25 LAB — TSH: TSH: 0.904 u[IU]/mL (ref 0.450–4.500)

## 2019-03-25 LAB — VITAMIN D 25 HYDROXY (VIT D DEFICIENCY, FRACTURES): Vit D, 25-Hydroxy: 28.7 ng/mL — ABNORMAL LOW (ref 30.0–100.0)

## 2019-03-25 NOTE — Telephone Encounter (Signed)
Call placed to convey benefits for ultrasound. °

## 2019-03-25 NOTE — Telephone Encounter (Signed)
-----   Message from Sarah Barry, CNM sent at 03/24/2019  4:37 PM EST ----- Patient needs PUS to evaluate lobular uterus noted on CT scan and uterine shape on exam.I do not want to refer to Dr. Collene Mares until this is done.

## 2019-03-25 NOTE — Telephone Encounter (Signed)
Spoke with patient. PUS scheduled for 03/30/19 at 12:30pm, consult at 1pm with Dr. Talbert Nan. Order placed for precert. Patient verbalizes understanding and is agreeable.   Routing to provider for final review. Patient is agreeable to disposition. Will close encounter.  Cc: Dr. Talbert Nan, Magdalene Patricia

## 2019-03-27 LAB — HGB A1C W/O EAG: Hgb A1c MFr Bld: 5.6 % (ref 4.8–5.6)

## 2019-03-27 LAB — SPECIMEN STATUS REPORT

## 2019-03-28 NOTE — Progress Notes (Signed)
Already resulted and patient contacted

## 2019-03-29 ENCOUNTER — Other Ambulatory Visit: Payer: Self-pay

## 2019-03-29 ENCOUNTER — Telehealth: Payer: Self-pay | Admitting: *Deleted

## 2019-03-29 NOTE — Telephone Encounter (Signed)
-----   Message from Regina Eck, CNM sent at 03/25/2019  3:37 PM EST ----- Notify patient her TSH is normal Her glucose was elevated at 106 and BUN/Creatinine ratio was elevated at 33 Lipid panel shows  normal cholesterol at 151 Triglycerides are very elevated at 228 normal <149 HDL is normal and LDL is normal CBC is normal no anemia Vitamin D borderline low at 28.7 she needs to start back OTC Vit. D 3 1000 IU daily She needs to follow up with PCP regarding this glucose and triglycerides elevation. If she is not seeing Dr. Jacelyn Grip any more, needs PCP referral. Pap smear is negative HPV negative

## 2019-03-29 NOTE — Telephone Encounter (Signed)
Patient returned call to office for [facility]. Aware Basilia Jumbo is off today. Patient aware we are collecting 69.34 for ultrasound 03/30/19. Let patient know I will send message to [facility] to convey all benefit information to patient prior to ultrasound if available.

## 2019-03-29 NOTE — Telephone Encounter (Signed)
Burnice Logan, RN  03/29/2019 3:16 PM EST    Left message to call Sharee Pimple, RN at [facility].    Regina Eck, CNM  03/28/2019 10:14 AM EST    Already resulted and patient contacted   Regina Eck, CNM  03/26/2019 7:35 AM EST    Notify patient her HGB A1-C is normal, but due to glucose and triglyceride elevation she needs to follow up with PCP as previously advised.

## 2019-03-30 ENCOUNTER — Encounter: Payer: Self-pay | Admitting: Obstetrics and Gynecology

## 2019-03-30 ENCOUNTER — Ambulatory Visit (INDEPENDENT_AMBULATORY_CARE_PROVIDER_SITE_OTHER): Payer: 59

## 2019-03-30 ENCOUNTER — Ambulatory Visit (INDEPENDENT_AMBULATORY_CARE_PROVIDER_SITE_OTHER): Payer: 59 | Admitting: Obstetrics and Gynecology

## 2019-03-30 VITALS — BP 122/64 | HR 83 | Temp 98.4°F | Ht 59.0 in | Wt 136.6 lb

## 2019-03-30 DIAGNOSIS — R102 Pelvic and perineal pain: Secondary | ICD-10-CM | POA: Diagnosis not present

## 2019-03-30 DIAGNOSIS — N83201 Unspecified ovarian cyst, right side: Secondary | ICD-10-CM | POA: Diagnosis not present

## 2019-03-30 DIAGNOSIS — R9389 Abnormal findings on diagnostic imaging of other specified body structures: Secondary | ICD-10-CM

## 2019-03-30 DIAGNOSIS — N83202 Unspecified ovarian cyst, left side: Secondary | ICD-10-CM

## 2019-03-30 DIAGNOSIS — D219 Benign neoplasm of connective and other soft tissue, unspecified: Secondary | ICD-10-CM

## 2019-03-30 NOTE — Progress Notes (Signed)
GYNECOLOGY  VISIT   HPI: 49 y.o.   Single White or Caucasian Not Hispanic or Latino  female   [id number] with Patient's last menstrual period was 02/28/2019 (exact date).   here for  Pelvic ultrasound for abnormal findings on CT, her uterus was lobular, suspected fibroids. . Menses q month x 3-5 days. She saturates a pad in 1/2 a day. No intermenstrual bleeding. No dysmenorrhea. No postcoital bleeding. Intermittent deep dyspareunia, just in the last 2 years, not every time, would need to stop. Just broke up with her boyfriend. No known h/o endometriosis.  Random episodes of a severe squeezing pain when she strains to have a BM or even to void. The pain can last a couple of days off and on. The pain is a 10/10 when she strains. When not straining it goes to a 6/10 in severity. She has some constipation, goes daily but needs to take a natural laxative 5 x a week.   GYNECOLOGIC HISTORY: Patient's last menstrual period was 02/28/2019 (exact date). Contraception no Menopausal hormone therapy: no        OB History    Gravida  0   Para  0   Term  0   Preterm  0   AB  0   Living  0     SAB  0   TAB  0   Ectopic  0   Multiple  0   Live Births                 Patient Active Problem List   Diagnosis Date Noted  . History of food allergy 12/27/2014  . Chronic rhinitis 12/27/2014  . Gastritis 12/27/2014  . 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 Excessive tear production of left lacrimal gland 05/10/2014  . Endometrial polyp 07/23/2013    Class: History of  . Enlarged uterus 05/19/2013  . Dysmenorrhea 05/19/2013    Class: History of  . Menorrhagia with regular cycle 05/19/2013    Class: History of  . Tobacco use disorder 04/27/2012  . Iron deficiency anemia 01/24/2012  . Depressive disorder, not elsewhere classified 01/23/2012  . GERD (gastroesophageal reflux disease) 01/23/2012  . BACK PAIN, UPPER 08/15/2009   . TRIGGER FINGER, RIGHT THUMB 08/15/2009  . BIPOLAR I, MIXED, MOST RECENT EPSD NOS 08/04/2006  . CHOLECYSTECTOMY, HX OF 08/04/2006    Past Medical History:  Diagnosis Date  . Abnormal Pap smear of cervix   . Allergic rhinitis   . Anemia   . Bipolar disorder ([place])   . Cancer ([facility])    skin ca - buttocks  . Chronic headache    during cycle  . Colon polyp 04/2012   tubular adenoma  . Depression   . Dysmenorrhea 05/19/2013  . Fatigue   . Fibroid   . Folliculitis   . GERD (gastroesophageal reflux disease)   . HSV infection   . Positive H. pylori test   . STD (sexually transmitted disease)    genital warts  . Thrombocytosis ([place])   . Tongue disorder     Past Surgical History:  Procedure Laterality Date  . CHOLECYSTECTOMY  2000  . COLONOSCOPY  04/2012   tubular adenoma  . COLPOSCOPY  2014  . HYSTEROSCOPY WITH D & C N/A 12/01/2013   Procedure: INSERTION OF SEPRAFILM;  Surgeon: Governor Specking, MD;  Location: [facility];  Service: Gynecology;  Laterality: N/A;  . MYOMECTOMY N/A 12/01/2013  Procedure: LAPAROSCOPY/GELPORT ASSISTED MYOMECTOMY/ADENOMYOMECTOMY;  Surgeon: Governor Specking, MD;  Location: [facility];  Service: Gynecology;  Laterality: N/A;  . SKIN CANCER EXCISION     buttock area, Dr. Marlou Starks    Current Outpatient Medications  Medication Sig Dispense Refill  . acetaminophen (TYLENOL) 500 MG tablet Take 500 mg by mouth daily as needed for moderate pain.    Marland Kitchen acyclovir (ZOVIRAX) 400 MG tablet as needed.    Marland Kitchen ampicillin (PRINCIPEN) 500 MG capsule Take 500 mg by mouth 2 (two) times daily as needed (acne).     Marland Kitchen ibuprofen (ADVIL) 200 MG tablet Take 600 mg by mouth daily as needed for headache or moderate pain.    . meloxicam (MOBIC) 15 MG tablet Take 15 mg by mouth daily.    . Misc Natural Products (COLON CLEANSE) CAPS Take 2 capsules by mouth 3 (three) times a week. Take at night    . omeprazole (PRILOSEC) 40 MG capsule Take 40 mg by mouth  daily.     No current facility-administered medications for this visit.     ALLERGIES: Patient has no known allergies.  Family History  Problem Relation Age of Onset  . Colon cancer Maternal Grandfather   . Cancer Maternal Grandfather        colon cancer  . Depression Maternal Grandfather   . Diabetes Paternal Grandmother   . Alcohol abuse Father   . Heart disease Father 67       MI  . Hyperlipidemia Father   . Hypertension Father   . Depression Father   . Osteoporosis Maternal Grandmother   . Dementia Mother   . Esophageal cancer Neg Hx   . Rectal cancer Neg Hx   . Stomach cancer Neg Hx     Social History   Socioeconomic History  . Marital status: Single    Spouse name: Not on file  . Number of children: 0  . Years of education: Not on file  . Highest education level: Not on file  Occupational History  . Occupation: CNA at [employer]: [employer]   Tobacco Use  . Smoking status: Former Smoker    Packs/day: 1.00    Years: 13.00    Pack years: 13.00    Types: Cigarettes  . Smokeless tobacco: Never Used  Substance and Sexual Activity  . Alcohol use: No    Alcohol/week: 0.0 standard drinks  . Drug use: No  . Sexual activity: Yes    Partners: Male    Birth control/protection: None  Other Topics Concern  . Not on file  Social History Narrative   Single.  Lives alone, has 1 dog.   Social Determinants of Health   Financial Resource Strain:   . Difficulty of Paying Living Expenses: Not on file  Food Insecurity:   . Worried About Charity fundraiser in the Last Year: Not on file  . Ran Out of Food in the Last Year: Not on file  Transportation Needs:   . Lack of Transportation (Medical): Not on file  . Lack of Transportation (Non-Medical): Not on file  Physical Activity:   . Days of Exercise per Week: Not on file  . Minutes of Exercise per Session: Not on file  Stress:   . Feeling of Stress : Not on file  Social  Connections:   . Frequency of Communication with Friends and Family: Not on file  . Frequency of Social Gatherings with Friends and Family: Not on  file  . Attends Religious Services: Not on file  . Active Member of Clubs or Organizations: Not on file  . Attends Archivist Meetings: Not on file  . Marital Status: Not on file  Intimate Partner Violence:   . Fear of Current or Ex-Partner: Not on file  . Emotionally Abused: Not on file  . Physically Abused: Not on file  . Sexually Abused: Not on file    Review of Systems  All other systems reviewed and are negative.   PHYSICAL EXAMINATION:    BP 122/64   Pulse 83   Temp 98.4 F (36.9 C)   Ht 4\' 11"  (1.499 m)   Wt 136 lb 9.6 oz (62 kg)   LMP 02/28/2019 (Exact Date)   SpO2 98%   BMI 27.59 kg/m     General appearance: alert, cooperative and appears stated age Abdomen: soft, non-tender; non distended, no masses,  no organomegaly  Pelvic: External genitalia:  no lesions              Urethra:  normal appearing urethra with no masses, tenderness or lesions              Bartholins and Skenes: normal                 Vagina: normal appearing vagina with normal color and discharge, no lesions              Cervix: no cervical motion tenderness              Bimanual Exam:  Uterus:  anteverted, mobile, normal sized, tender with moving the uterus              Adnexa: fullness and tenderness behing the uterus              Rectovaginal: Yes.  .  Confirms.              Anus:  normal sphincter tone, no lesions  Pelvic floor: not tender.  Chaperone was present for exam.  I personally reviewed her CT images in 4/20  Ultrasound images reviewed with the patient  ASSESSMENT Abnormal CT scan On ultrasound she has a thickened endometrium with a  suspected endometrial polyp and bilateral ovarian cysts, suspicious for endometriomas. No abnormal bleeding Deep intermittent dyspareunia. Pelvic pain intermittently for the last year,  starts with mild straining. Suspect she might be having intermittent ovarian torsion Constipation     PLAN Will plan a sonohysterogram after her cycle in April Will reevaluate her ovaries at this time.  Discussed possible endometriosis, information given.  Discussed ovarian cysts, risk of torsion or rupture If she calls with severe pain, will try and get her in for an ultrasound to r/o ovarian torsion.    An After Visit Summary was printed and given to the patient.  In addition to reviewing the ultrasound with the patient, ~15 minutes was spent further evaluating her pelvic pain and dyspareunia which were not the reason for the ultrasound.

## 2019-03-30 NOTE — Telephone Encounter (Signed)
Spoke with patient, advised of all results as seen below per Melvia Heaps, CNM. Patient request copy of labs to Dr. Donn Pierini -labs faxed via [place]. Patient will contact PCP to schedule f/u. Patient verbalizes understanding and is agreeable.   Encounter closed.

## 2019-03-30 NOTE — Telephone Encounter (Signed)
Spoke with the patient and conveyed the benefits. Patient understands/agreeable with the benefits. Patient is aware of the cancellation policy. Appointment scheduled 03/30/19.

## 2019-03-30 NOTE — Telephone Encounter (Signed)
Patient is returning a call to Jill. °

## 2019-03-30 NOTE — Patient Instructions (Addendum)
Ovarian Cyst     An ovarian cyst is a fluid-filled sac that forms on an ovary. The ovaries are small organs that produce eggs in women. Various types of cysts can form on the ovaries. Some may cause symptoms and require treatment. Most ovarian cysts go away on their own, are not cancerous (are benign), and do not cause problems. Common types of ovarian cysts include:  Functional (follicle) cysts. ? Occur during the menstrual cycle, and usually go away with the next menstrual cycle if you do not get pregnant. ? Usually cause no symptoms.  Endometriomas. ? Are cysts that form from the tissue that lines the uterus (endometrium). ? Are sometimes called "chocolate cysts" because they become filled with blood that turns [place]. ? Can cause pain in the lower abdomen during intercourse and during your period.  Cystadenoma cysts. ? Develop from cells on the outside surface of the ovary. ? Can get very large and cause lower abdomen pain and pain with intercourse. ? Can cause severe pain if they twist or break open (rupture).  Dermoid cysts. ? Are sometimes found in both ovaries. ? May contain different kinds of body tissue, such as skin, teeth, hair, or cartilage. ? Usually do not cause symptoms unless they get very big.  Theca lutein cysts. ? Occur when too much of a certain hormone (human chorionic gonadotropin) is produced and overstimulates the ovaries to produce an egg. ? Are most common after having procedures used to assist with the conception of a baby (in vitro fertilization). What are the causes? Ovarian cysts may be caused by:  Ovarian hyperstimulation syndrome. This is a condition that can develop from taking fertility medicines. It causes multiple large ovarian cysts to form.  Polycystic ovarian syndrome (PCOS). This is a common hormonal disorder that can cause ovarian cysts, as well as problems with your period or fertility. What increases the risk? The following factors may  make you more likely to develop ovarian cysts:  Being overweight or obese.  Taking fertility medicines.  Taking certain forms of hormonal birth control.  Smoking. What are the signs or symptoms? Many ovarian cysts do not cause symptoms. If symptoms are present, they may include:  Pelvic pain or pressure.  Pain in the lower abdomen.  Pain during sex.  Abdominal swelling.  Abnormal menstrual periods.  Increasing pain with menstrual periods. How is this diagnosed? These cysts are commonly found during a routine pelvic exam. You may have tests to find out more about the cyst, such as:  Ultrasound.  X-ray of the pelvis.  CT scan.  MRI.  Blood tests. How is this treated? Many ovarian cysts go away on their own without treatment. Your health care provider may want to check your cyst regularly for 2-3 months to see if it changes. If you are in menopause, it is especially important to have your cyst monitored closely because menopausal women have a higher rate of ovarian cancer. When treatment is needed, it may include:  Medicines to help relieve pain.  A procedure to drain the cyst (aspiration).  Surgery to remove the whole cyst.  Hormone treatment or birth control pills. These methods are sometimes used to help dissolve a cyst. Follow these instructions at home:  Take over-the-counter and prescription medicines only as told by your health care provider.  Do not drive or use heavy machinery while taking prescription pain medicine.  Get regular pelvic exams and Pap tests as often as told by your health care provider.    Return to your normal activities as told by your health care provider. Ask your health care provider what activities are safe for you.  Do not use any products that contain nicotine or tobacco, such as cigarettes and e-cigarettes. If you need help quitting, ask your health care provider.  Keep all follow-up visits as told by your health care provider.  This is important. Contact a health care provider if:  Your periods are late, irregular, or painful, or they stop.  You have pelvic pain that does not go away.  You have pressure on your bladder or trouble emptying your bladder completely.  You have pain during sex.  You have any of the following in your abdomen: ? A feeling of fullness. ? Pressure. ? Discomfort. ? Pain that does not go away. ? Swelling.  You feel generally ill.  You become constipated.  You lose your appetite.  You develop severe acne.  You start to have more body hair and facial hair.  You are gaining weight or losing weight without changing your exercise and eating habits.  You think you may be pregnant. Get help right away if:  You have abdominal pain that is severe or gets worse.  You cannot eat or drink without vomiting.  You suddenly develop a fever.  Your menstrual period is much heavier than usual. This information is not intended to replace advice given to you by your health care provider. Make sure you discuss any questions you have with your health care provider. Document Revised: 05/05/2017 Document Reviewed: 07/09/2015 Elsevier Patient Education  [employer]. Endometriosis  Endometriosis is a condition in which the tissue that lines the uterus (endometrium) grows outside of its normal location. The tissue may grow in many locations close to the uterus, but it commonly grows on the ovaries, fallopian tubes, vagina, or bowel. When the uterus sheds the endometrium every menstrual cycle, there is bleeding wherever the endometrial tissue is located. This can cause pain because blood is irritating to tissues that are not normally exposed to it. What are the causes? The cause of endometriosis is not known. What increases the risk? You may be more likely to develop endometriosis if you: About Constipation  Constipation Overview Constipation is the most common gastrointestinal  complaint -- about 4 million Americans experience constipation and make 2.5 million physician visits a year to get help for the problem.  Constipation can occur when the colon absorbs too much water, the colon's muscle contraction is slow or sluggish, and/or there is delayed transit time through the colon.  The result is stool that is hard and dry.  Indicators of constipation include straining during bowel movements greater than 25% of the time, having fewer than three bowel movements per week, and/or the feeling of incomplete evacuation.  There are established guidelines ([place] II ) for defining constipation. A person needs to have two or more of the following symptoms for at least 12 weeks (not necessarily consecutive) in the preceding 12 months: Straining in  greater than 25% of bowel movements Lumpy or hard stools in greater than 25% of bowel movements Sensation of incomplete emptying in greater than 25% of bowel movements Sensation of anorectal obstruction/blockade in greater than 25% of bowel movements Manual maneuvers to help empty greater than 25% of bowel movements (e.g., digital evacuation, support of the pelvic floor)  Less than  3 bowel movements/week Loose stools are not present, and criteria for irritable bowel syndrome are insufficient  Common Causes of Constipation Lack  of fiber in your diet Lack of physical activity Medications, including iron and calcium supplements  Dairy intake Dehydration Abuse of laxatives Travel Irritable Bowel Syndrome Pregnancy Luteal phase of menstruation (after ovulation and before menses) Colorectal problems Intestinal Dysfunction  Treating Constipation  There are several ways of treating constipation, including changes to diet and exercise, use of laxatives, adjustments to the pelvic floor, and scheduled toileting.  These treatments include: increasing fiber and fluids in the diet  increasing physical activity learning muscle coordination   learning proper toileting techniques and toileting modifications  designing and sticking  to a toileting schedule      2007, [employer] Doc.22Have a family history of endometriosis.  Have never given birth.  Started your period at age 45 or younger.  Have high levels of estrogen in your body.  Were exposed to a certain medicine (diethylstilbestrol) before you were born (in utero).  Had low birth weight.  Were born as a twin, triplet, or other multiple.  Have a BMI of less than 25. BMI is an estimate of body fat and is calculated from height and weight. What are the signs or symptoms? Often, there are no symptoms of this condition. If you do have symptoms, they may:  Vary depending on where your endometrial tissue is growing.  Occur during your menstrual period (most common) or midcycle.  Come and go, or you may go months with no symptoms at all.  Stop with menopause. Symptoms may include:  Pain in the back or abdomen.  Heavier bleeding during periods.  Pain during sex.  Painful bowel movements.  Infertility.  Pelvic pain.  Bleeding more than once a month. How is this diagnosed? This condition is diagnosed based on your symptoms and a physical exam. You may have tests, such as:  Blood tests and urine tests. These may be done to help rule out other possible causes of your symptoms.  Ultrasound, to look for abnormal tissues.  An X-ray of the lower bowel (barium enema).  An ultrasound that is done through the vagina (transvaginally).  CT scan.  MRI.  Laparoscopy. In this procedure, a lighted, pencil-sized instrument called a laparoscope is inserted into your abdomen through an incision. The laparoscope allows your health care provider to look at the organs inside your body and check for abnormal tissue to confirm the diagnosis. If abnormal tissue is found, your health care provider may remove a small piece of tissue (biopsy) to be examined  under a microscope. How is this treated? Treatment for this condition may include:  Medicines to relieve pain, such as NSAIDs.  Hormone therapy. This involves using artificial (synthetic) hormones to reduce endometrial tissue growth. Your health care provider may recommend using a hormonal form of birth control, or other medicines.  Surgery. This may be done to remove abnormal endometrial tissue. ? In some cases, tissue may be removed using a laparoscope and a laser (laparoscopic laser treatment). ? In severe cases, surgery may be done to remove the fallopian tubes, uterus, and ovaries (hysterectomy). Follow these instructions at home:  Take over-the-counter and prescription medicines only as told by your health care provider.  Do not drive or use heavy machinery while taking prescription pain medicine.  Try to avoid activities that cause pain, including sexual activity.  Keep all follow-up visits as told by your health care provider. This is important. Contact a health care provider if:  You have pain in the area between your hip bones (pelvic area) that occurs: ?  Before, during, or after your period. ? In between your period and gets worse during your period. ? During or after sex. ? With bowel movements or urination, especially during your period.  You have problems getting pregnant.  You have a fever. Get help right away if:  You have severe pain that does not get better with medicine.  You have severe nausea and vomiting, or you cannot eat without vomiting.  You have pain that affects only the lower, right side of your abdomen.  You have abdominal pain that gets worse.  You have abdominal swelling.  You have blood in your stool. This information is not intended to replace advice given to you by your health care provider. Make sure you discuss any questions you have with your health care provider. Document Revised: 01/17/2017 Document Reviewed: 07/08/2015 Elsevier  Patient Education  2020 [employer].

## 2019-03-30 NOTE — Telephone Encounter (Signed)
Left message to call Shyanna Klingel, RN at [facility] [phone].   

## 2019-04-07 ENCOUNTER — Telehealth: Payer: Self-pay | Admitting: Obstetrics and Gynecology

## 2019-04-07 ENCOUNTER — Encounter: Payer: Self-pay | Admitting: Obstetrics and Gynecology

## 2019-04-07 NOTE — Telephone Encounter (Signed)
Spoke to pt. Pt states having painful intercourse and wanting some kind of birth control due to not having menopause yet. Pt was just seen for PUS with Dr Talbert Nan on 03/30/2019. Pt states not wanting to wait for follow up in April. Pt scheduled with Dr Talbert Nan on 04/12/2019 at 4:30 pm per pts requests d/t work schedule. Pt agreeable and verbalized understanding.   Routing to Dr Talbert Nan for review and will close encounter.

## 2019-04-07 NOTE — Telephone Encounter (Signed)
Patient sent the following message through [facility]. Routing to triage to assist patient with request.  Devana, [place] [facility]  Phone Number: [phone]  The pain during sex is still extremely painful. Should I still wait until or April recheck appointment? I also think I should get on some kind of birth control.

## 2019-04-12 ENCOUNTER — Telehealth: Payer: Self-pay | Admitting: Obstetrics and Gynecology

## 2019-04-12 ENCOUNTER — Encounter: Payer: Self-pay | Admitting: Obstetrics and Gynecology

## 2019-04-12 ENCOUNTER — Ambulatory Visit: Payer: 59 | Admitting: Obstetrics and Gynecology

## 2019-04-12 NOTE — Telephone Encounter (Signed)
Patient did not keep scheduled appointment. Left voicemail to call and reschedule. Letter sent.

## 2019-05-10 ENCOUNTER — Encounter: Payer: Self-pay | Admitting: Certified Nurse Midwife

## 2019-05-20 ENCOUNTER — Telehealth: Payer: Self-pay | Admitting: Obstetrics and Gynecology

## 2019-05-20 NOTE — Telephone Encounter (Signed)
Call to patient. Per DPR, OK to leave message on voicemail.   Left voicemail requesting a return call to [place] to review benefits and schedule recommended Sonohysterogram with Jill Jertson, MD 

## 2019-05-24 NOTE — Telephone Encounter (Signed)
Call to patient. Per DPR, OK to leave message on voicemail.   Left voicemail requesting a return call to [place] to review benefits and schedule recommended Sonohysterogram with Jill Jertson, MD 

## 2019-05-27 ENCOUNTER — Encounter: Payer: Self-pay | Admitting: Obstetrics and Gynecology

## 2019-05-27 ENCOUNTER — Other Ambulatory Visit: Payer: Self-pay | Admitting: Obstetrics and Gynecology

## 2019-05-27 DIAGNOSIS — A630 Anogenital (venereal) warts: Secondary | ICD-10-CM

## 2019-05-27 DIAGNOSIS — Z8619 Personal history of other infectious and parasitic diseases: Secondary | ICD-10-CM

## 2019-05-27 NOTE — Telephone Encounter (Signed)
Non-Urgent Medical Question Received: Today Message Contents  Ma[place]y, [place] sent to [place]  Phone Number: [phone]  Can my prescription Acyclovir 400mg  Tab be called in at [facility] on [address] please. They say they have no record. Thank you

## 2019-05-27 NOTE — Telephone Encounter (Signed)
Left message for pt to return call to triage RN. 

## 2019-05-27 NOTE — Telephone Encounter (Signed)
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 Acyclovir is 400 mg TID x 5 days Valtrex is 500 mg BID x 3 days.  Call in which ever one she would prefer. #30, 2 refills

## 2019-05-27 NOTE — Telephone Encounter (Signed)
Medication refill request: Acyclovir  Last AEX:  03-24-19 DL  Next AEX: not scheduled  Last MMG (if hormonal medication request): n/a Refill authorized: today, please advise.   Medication pended for #30, 1RF. Please refill if appropriate.

## 2019-05-28 MED ORDER — VALACYCLOVIR HCL 500 MG PO TABS: 500.0000 mg | ORAL_TABLET | Freq: Two times a day (BID) | ORAL | 2 refills | Status: DC

## 2019-05-28 NOTE — Telephone Encounter (Signed)
Spoke with pt. Pt given options for HSV treatment. Pt states would like to change to Valtrex Rx.  Valtrex Rx  500mg , BID x 3 days # 30, 2 RF sent. Pharmacy verified by pt. Pt agreeable and verbalized understanding of new instructions and medication.   Routing to Dr Talbert Nan for review.  Encounter closed.

## 2019-05-28 NOTE — Telephone Encounter (Signed)
Call to patient. Per DPR, OK to leave message on voicemail.   Left voicemail requesting a return call to [place] to review benefits and schedule recommended Sonohysterogram with Jill Jertson, MD 

## 2019-06-08 ENCOUNTER — Encounter: Payer: Self-pay | Admitting: Obstetrics and Gynecology

## 2019-06-08 ENCOUNTER — Ambulatory Visit: Payer: 59 | Admitting: Obstetrics and Gynecology

## 2019-06-08 ENCOUNTER — Other Ambulatory Visit: Payer: Self-pay | Admitting: Obstetrics and Gynecology

## 2019-06-08 ENCOUNTER — Other Ambulatory Visit: Payer: Self-pay

## 2019-06-08 ENCOUNTER — Ambulatory Visit (INDEPENDENT_AMBULATORY_CARE_PROVIDER_SITE_OTHER): Payer: 59

## 2019-06-08 VITALS — BP 110/70 | HR 73 | Temp 98.1°F | Ht 60.0 in | Wt 135.0 lb

## 2019-06-08 DIAGNOSIS — N840 Polyp of corpus uteri: Secondary | ICD-10-CM | POA: Diagnosis not present

## 2019-06-08 DIAGNOSIS — R9389 Abnormal findings on diagnostic imaging of other specified body structures: Secondary | ICD-10-CM

## 2019-06-08 DIAGNOSIS — Z3009 Encounter for other general counseling and advice on contraception: Secondary | ICD-10-CM | POA: Diagnosis not present

## 2019-06-08 DIAGNOSIS — N83202 Unspecified ovarian cyst, left side: Secondary | ICD-10-CM

## 2019-06-08 DIAGNOSIS — Z113 Encounter for screening for infections with a predominantly sexual mode of transmission: Secondary | ICD-10-CM

## 2019-06-08 DIAGNOSIS — N83201 Unspecified ovarian cyst, right side: Secondary | ICD-10-CM

## 2019-06-08 DIAGNOSIS — Z8742 Personal history of other diseases of the female genital tract: Secondary | ICD-10-CM

## 2019-06-08 MED ORDER — NORETHINDRONE 0.35 MG PO TABS: 1.0000 | ORAL_TABLET | Freq: Every day | ORAL | 0 refills | Status: DC

## 2019-06-08 NOTE — Patient Instructions (Signed)
Norethindrone tablets (contraception) What is this medicine? NORETHINDRONE (nor eth IN drone) is an oral contraceptive. The product contains a female hormone known as a progestin. It is used to prevent pregnancy. This medicine may be used for other purposes; ask your health care provider or pharmacist if you have questions. COMMON BRAND NAME(S): Camila, Deblitane 28-Day, Errin, Heather, Jencycla, Jolivette, Lyza, Nor-QD, Nora-BE, Norlyroc, Ortho Micronor, Sharobel 28-Day What should I tell my health care provider before I take this medicine? They need to know if you have any of these conditions:  blood vessel disease or blood clots  breast, cervical, or vaginal cancer  diabetes  heart disease  kidney disease  liver disease  mental depression  migraine  seizures  stroke  vaginal bleeding  an unusual or allergic reaction to norethindrone, other medicines, foods, dyes, or preservatives  pregnant or trying to get pregnant  breast-feeding How should I use this medicine? Take this medicine by mouth with a glass of water. You may take it with or without food. Follow the directions on the prescription label. Take this medicine at the same time each day and in the order directed on the package. Do not take your medicine more often than directed. Contact your pediatrician regarding the use of this medicine in children. Special care may be needed. This medicine has been used in female children who have started having menstrual periods. A patient package insert for the product will be given with each prescription and refill. Read this sheet carefully each time. The sheet may change frequently. Overdosage: If you think you have taken too much of this medicine contact a poison control center or emergency room at once. NOTE: This medicine is only for you. Do not share this medicine with others. What if I miss a dose? Try not to miss a dose. Every time you miss a dose or take a dose late  your chance of pregnancy increases. When 1 pill is missed (even if only 3 hours late), take the missed pill as soon as possible and continue taking a pill each day at the regular time (use a back up method of birth control for the next 48 hours). If more than 1 dose is missed, use an additional birth control method for the rest of your pill pack until menses occurs. Contact your health care professional if more than 1 dose has been missed. What may interact with this medicine? Do not take this medicine with any of the following medications:  amprenavir or fosamprenavir  bosentan This medicine may also interact with the following medications:  antibiotics or medicines for infections, especially rifampin, rifabutin, rifapentine, and griseofulvin, and possibly penicillins or tetracyclines  aprepitant  barbiturate medicines, such as phenobarbital  carbamazepine  felbamate  modafinil  oxcarbazepine  phenytoin  ritonavir or other medicines for HIV infection or AIDS  St. John's wort  topiramate This list may not describe all possible interactions. Give your health care provider a list of all the medicines, herbs, non-prescription drugs, or dietary supplements you use. Also tell them if you smoke, drink alcohol, or use illegal drugs. Some items may interact with your medicine. What should I watch for while using this medicine? Visit your doctor or health care professional for regular checks on your progress. You will need a regular breast and pelvic exam and Pap smear while on this medicine. Use an additional method of birth control during the first cycle that you take these tablets. If you have any reason to think you   are pregnant, stop taking this medicine right away and contact your doctor or health care professional. If you are taking this medicine for hormone related problems, it may take several cycles of use to see improvement in your condition. This medicine does not protect you  against HIV infection (AIDS) or any other sexually transmitted diseases. What side effects may I notice from receiving this medicine? Side effects that you should report to your doctor or health care professional as soon as possible:  breast tenderness or discharge  pain in the abdomen, chest, groin or leg  severe headache  skin rash, itching, or hives  sudden shortness of breath  unusually weak or tired  vision or speech problems  yellowing of skin or eyes Side effects that usually do not require medical attention (report to your doctor or health care professional if they continue or are bothersome):  changes in sexual desire  change in menstrual flow  facial hair growth  fluid retention and swelling  headache  irritability  nausea  weight gain or loss This list may not describe all possible side effects. Call your doctor for medical advice about side effects. You may report side effects to FDA at 1-800-FDA-1088. Where should I keep my medicine? Keep out of the reach of children. Store at room temperature between 15 and 30 degrees C (59 and 86 degrees F). Throw away any unused medicine after the expiration date. NOTE: This sheet is a summary. It may not cover all possible information. If you have questions about this medicine, talk to your doctor, pharmacist, or health care provider.  2020 Elsevier/Gold Standard (2011-10-25 16:41:35)  

## 2019-06-08 NOTE — Progress Notes (Signed)
GYNECOLOGY  VISIT   HPI: 49 y.o.   Single White or Caucasian Not Hispanic or Latino  female   [id number] with Patient's last menstrual period was 05/23/2019.   here for a f/u ultrasound, sonohysterogram. The patient was seen in 2/21 for a pelvic ultrasound after abnormal findings on CT (suspected myomas).  At the time of her visit she c/o a 1 year h/o intermittent severe pelvic pain and intermittent deep dyspareunia.  Ultrasound showed a thickened endometrium with a suspected polyp and bilateral ovarian cysts concerning for endometriomas.  No AUB.  Her pain is better, has only had intercourse 1 x and it was fine. Cycles are monthly x 4 days. She can saturate 3 pads in a day at most. No cramps, no BTB. No postcoital bleeding.   GYNECOLOGIC HISTORY: Patient's last menstrual period was 05/23/2019. Contraception:none  Menopausal hormone therapy: none         OB History    Gravida  0   Para  0   Term  0   Preterm  0   AB  0   Living  0     SAB  0   TAB  0   Ectopic  0   Multiple  0   Live Births                 Patient Active Problem List   Diagnosis Date Noted  . History of food allergy 12/27/2014  . Chronic rhinitis 12/27/2014  . Gastritis 12/27/2014  . 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 Excessive tear production of left lacrimal gland 05/10/2014  . Endometrial polyp 07/23/2013    Class: History of  . Enlarged uterus 05/19/2013  . Dysmenorrhea 05/19/2013    Class: History of  . Menorrhagia with regular cycle 05/19/2013    Class: History of  . Tobacco use disorder 04/27/2012  . Iron deficiency anemia 01/24/2012  . Depressive disorder, not elsewhere classified 01/23/2012  . GERD (gastroesophageal reflux disease) 01/23/2012  . BACK PAIN, UPPER 08/15/2009  . TRIGGER FINGER, RIGHT THUMB 08/15/2009  . BIPOLAR I, MIXED, MOST RECENT EPSD NOS 08/04/2006  . CHOLECYSTECTOMY, HX OF 08/04/2006     Past Medical History:  Diagnosis Date  . Abnormal Pap smear of cervix   . Allergic rhinitis   . Anemia   . Bipolar disorder ([place])   . Cancer (HCC)    skin ca - buttocks  . Chronic headache    during cycle  . Colon polyp 04/2012   tubular adenoma  . Depression   . Dysmenorrhea 05/19/2013  . Fatigue   . Fibroid   . Folliculitis   . GERD (gastroesophageal reflux disease)   . HSV infection   . Positive H. pylori test   . STD (sexually transmitted disease)    genital warts  . Thrombocytosis ([place])   . Tongue disorder     Past Surgical History:  Procedure Laterality Date  . CHOLECYSTECTOMY  2000  . COLONOSCOPY  04/2012   tubular adenoma  . COLPOSCOPY  2014  . HYSTEROSCOPY WITH D & C N/A 12/01/2013   Procedure: INSERTION OF SEPRAFILM;  Surgeon: Governor Specking, MD;  Location: [facility];  Service: Gynecology;  Laterality: N/A;  . MYOMECTOMY N/A 12/01/2013   Procedure: LAPAROSCOPY/GELPORT ASSISTED MYOMECTOMY/ADENOMYOMECTOMY;  Surgeon: Governor Specking, MD;  Location: [facility];  Service: Gynecology;  Laterality: N/A;  . SKIN CANCER EXCISION     buttock  area, Dr. Marlou Starks    Current Outpatient Medications  Medication Sig Dispense Refill  . acetaminophen (TYLENOL) 500 MG tablet Take 500 mg by mouth daily as needed for moderate pain.    Marland Kitchen acyclovir (ZOVIRAX) 400 MG tablet as needed.    Marland Kitchen ampicillin (PRINCIPEN) 500 MG capsule Take 500 mg by mouth 2 (two) times daily as needed (acne).     Marland Kitchen ibuprofen (ADVIL) 200 MG tablet Take 600 mg by mouth daily as needed for headache or moderate pain.    . Misc Natural Products (COLON CLEANSE) CAPS Take 2 capsules by mouth 3 (three) times a week. Take at night    . omeprazole (PRILOSEC) 40 MG capsule Take 40 mg by mouth daily.     No current facility-administered medications for this visit.     ALLERGIES: Patient has no known allergies.  Family History  Problem Relation Age of Onset  . Colon cancer  Maternal Grandfather   . Cancer Maternal Grandfather        colon cancer  . Depression Maternal Grandfather   . Diabetes Paternal Grandmother   . Alcohol abuse Father   . Heart disease Father 50       MI  . Hyperlipidemia Father   . Hypertension Father   . Depression Father   . Osteoporosis Maternal Grandmother   . Dementia Mother   . Esophageal cancer Neg Hx   . Rectal cancer Neg Hx   . Stomach cancer Neg Hx     Social History   Socioeconomic History  . Marital status: Single    Spouse name: Not on file  . Number of children: 0  . Years of education: Not on file  . Highest education level: Not on file  Occupational History  . Occupation: CNA at [employer]: [employer]   Tobacco Use  . Smoking status: Current Every Day Smoker    Packs/day: 1.00    Years: 7.00    Pack years: 7.00    Types: Cigarettes  . Smokeless tobacco: Never Used  Substance and Sexual Activity  . Alcohol use: No    Alcohol/week: 0.0 standard drinks  . Drug use: No  . Sexual activity: Yes    Partners: Male    Birth control/protection: None  Other Topics Concern  . Not on file  Social History Narrative   Single.  Lives alone, has 1 dog.   Social Determinants of Health   Financial Resource Strain:   . Difficulty of Paying Living Expenses:   Food Insecurity:   . Worried About Charity fundraiser in the Last Year:   . Arboriculturist in the Last Year:   Transportation Needs:   . Film/video editor (Medical):   Marland Kitchen Lack of Transportation (Non-Medical):   Physical Activity:   . Days of Exercise per Week:   . Minutes of Exercise per Session:   Stress:   . Feeling of Stress :   Social Connections:   . Frequency of Communication with Friends and Family:   . Frequency of Social Gatherings with Friends and Family:   . Attends Religious Services:   . Active Member of Clubs or Organizations:   . Attends Archivist Meetings:   Marland Kitchen Marital Status:    Intimate Partner Violence:   . Fear of Current or Ex-Partner:   . Emotionally Abused:   Marland Kitchen Physically Abused:   . Sexually Abused:     Review of Systems  All other systems reviewed and are negative.   PHYSICAL EXAMINATION:    BP 110/70   Pulse 73   Temp 98.1 F (36.7 C)   Ht 5' (1.524 m)   Wt 135 lb (61.2 kg)   LMP 05/23/2019   SpO2 94%   BMI 26.37 kg/m     General appearance: alert, cooperative and appears stated age    ASSESSMENT Prior ovarian cysts have resolved Still with 1.4 cm endometrial focus c/w a polyp (stable)  Normal cycles.  Contraception counseling    PLAN Discussed the option of doing a sonohysterogram to better delineate the polyp.  Discussed that without AUB and with a polyp size under 1.5 cm, the polyp doesn't have to be removed. Not candidate for OCP's (smoker), discussed the mini-pill, mirena IUD Will start the mini-pill Will return in 3 months for a repeat ultrasound (she doesn't want to wait 6 months since it is just under the cut off recommending removal)    In addition to reviewing the ultrasound, ~15 minutes was spent in total patient care specifically discussing patient management options and contraception.

## 2019-06-09 LAB — HEP, RPR, HIV PANEL
HIV Screen 4th Generation wRfx: NONREACTIVE
Hepatitis B Surface Ag: NEGATIVE
RPR Ser Ql: NONREACTIVE

## 2019-06-09 LAB — HEPATITIS C ANTIBODY: Hep C Virus Ab: <0.1 s/co ratio (ref 0.0–0.9)

## 2019-06-10 LAB — CHLAMYDIA/GONOCOCCUS/TRICHOMONAS, NAA
Chlamydia by NAA: NEGATIVE
Gonococcus by NAA: NEGATIVE
Trich vag by NAA: NEGATIVE

## 2019-06-12 ENCOUNTER — Encounter: Payer: Self-pay | Admitting: Obstetrics and Gynecology

## 2019-06-14 ENCOUNTER — Telehealth: Payer: Self-pay | Admitting: Obstetrics and Gynecology

## 2019-06-14 DIAGNOSIS — Z1211 Encounter for screening for malignant neoplasm of colon: Secondary | ICD-10-CM

## 2019-06-14 NOTE — Telephone Encounter (Signed)
Non-Urgent Medical Question Received: 2 days ago Message Contents  [place], [place] sent to [place]  Phone Number: [phone]  The last visit I had with Dr. Hollice Espy she was suppose to put a referral in for me to see a Gastrologist. I can not remember the Doctors name. I'm just making sure she did do that. Thank you.

## 2019-06-14 NOTE — Telephone Encounter (Signed)
AEX 03/24/2019 with DL Last OV 06/08/19 with JJ.  PUS/SHGM 4/20- 1.4 cm endometrial focus c/w a polyp (stable) prior ovarian cyst resolved.  Pt on Micronor, no AUB, normal cycles.   Spoke with pt. Pt wanting to know if still needs referral to Dr Collene Mares that Johny Shock, CNM had recommended? Per OV notes at [facility]. Pt to have referral to Dr Collene Mares if PUS was normal. Hx of abnormal colonoscopy with tubular adenoma 2014. FH of Colon Cancer.   Will discuss and review with Dr Talbert Nan and return call to pt. Pt agreeable.   Routing to Dr Talbert Nan

## 2019-06-16 NOTE — Telephone Encounter (Signed)
Left message for pt to return call to triage RN. 

## 2019-06-16 NOTE — Telephone Encounter (Signed)
On reviewing Dr Sarah Barry note from 2014, she is overdue for a colonoscopy. Please have her call and schedule.

## 2019-06-17 ENCOUNTER — Telehealth: Payer: Self-pay

## 2019-06-17 NOTE — Telephone Encounter (Signed)
Spoke with pt. Pt given update on referral from Dr Talbert Nan. Pt to call this am to make colonoscopy appt at Dr [facility] office. Pt agreeable and verbalized understanding.   Routing to Dr Talbert Nan Referral placed for colonoscopy  Cc: Basilia Jumbo  Encounter closed

## 2019-06-17 NOTE — Telephone Encounter (Signed)
Patient asked to talk with Colletta Maryland again.

## 2019-06-17 NOTE — Telephone Encounter (Signed)
Erroneous Encounter Closed

## 2019-06-21 ENCOUNTER — Telehealth: Payer: Self-pay | Admitting: Obstetrics and Gynecology

## 2019-06-21 ENCOUNTER — Encounter: Payer: Self-pay | Admitting: Obstetrics and Gynecology

## 2019-06-21 DIAGNOSIS — N840 Polyp of corpus uteri: Secondary | ICD-10-CM

## 2019-06-21 DIAGNOSIS — Z8742 Personal history of other diseases of the female genital tract: Secondary | ICD-10-CM

## 2019-06-21 NOTE — Telephone Encounter (Signed)
Visit Follow-Up Question Received: Today Message Contents  Sarah Barry, Sarah Barry sent to [place]  Phone Number: [phone]  When am I supposed to start my birth control pills? I'm on my period now. Thank you! Also, FYI I have been having night sweats for the last week and a half every night totally soaked. So I'm wondering if here comes menopause??

## 2019-06-21 NOTE — Telephone Encounter (Signed)
Left message for pt to return call to triage RN. 

## 2019-06-22 NOTE — Telephone Encounter (Signed)
Left detailed message per DPR. Pt given update and recommendations to start Micronor today and to return call to office to schedule 3 month SHGM. Pt to calendar cycles for now.   Routing to Dr Talbert Nan.  Encounter closed.  SHGM future orders placed.  Cc: Hayley for precert.

## 2019-06-22 NOTE — Telephone Encounter (Signed)
Last OV 06/08/19  AEX 03/24/19 with DL   Spoke with pt. Pt reports having started her menses on 5/2 with normal bleeding. Denies heavy bleeding, clots. Pt wanting to know when should she start the Micronor that she was prescribed on 06/08/19 OV? Advised will discuss with Dr Talbert Nan and return call. Pt agreeable. Pt states ok to leave detailed message on mobile number.   Pt also states having night sweats for the last 1.5 weeks every night that is waking her up and having to change clothes. Pt states has new partner and not SA currently. Pt wanting to know if needs hormone labs drawn? Pt has no current hormone labs. Denies other vasomotor symptoms.  Advised will review with Dr Talbert Nan and return call. Pt agreeable.   Pt wanting to know when to schedule PUS or SHGM for follow up as seen below from [place] notes on 06/08/19:   Discussed the option of doing a sonohysterogram to better delineate the polyp.  Discussed that without AUB and with a polyp size under 1.5 cm, the polyp doesn't have to be removed. Not candidate for OCP's (smoker), discussed the mini-pill, mirena IUD Will start the mini-pill Will return in 3 months for a repeat ultrasound (she doesn't want to wait 6 months since it is just under the cut off recommending removal)  Routing to Dr Talbert Nan.

## 2019-06-22 NOTE — Telephone Encounter (Signed)
Left message for pt to return call to triage RN. 

## 2019-06-23 NOTE — Telephone Encounter (Signed)
Spoke with pt. Pt states started Micronor this morning. Pt states cycle that she started on 5/2 is gone today with no bleeding. Pt scheduled for [facility] 09/21/19 at 1230 pm with Dr Talbert Nan. Pt agreeable and verbalized understanding to date and time.   Routing to Dr Talbert Nan for review.  Encounter closed.

## 2019-07-07 ENCOUNTER — Telehealth: Payer: Self-pay | Admitting: Obstetrics and Gynecology

## 2019-07-07 DIAGNOSIS — Z1211 Encounter for screening for malignant neoplasm of colon: Secondary | ICD-10-CM

## 2019-07-07 NOTE — Telephone Encounter (Signed)
Patient would like to speak with nurse about Dr Collene Mares declining referral.

## 2019-07-07 NOTE — Telephone Encounter (Signed)
Spoke with pt. Pt states needing new referral to GI. Pt states Dr Collene Mares refused referral. Pt states does not want to go back to [facility] to be seen. Pt requesting new referral to new GI.  Advised will review with Dr Talbert Nan and return call to pt. Pt agreeable.   Reviewed with Dr Talbert Nan. Orders given to send pt to another GI. Will contact [place] for new referral at [facility].   Routing to Dr Talbert Nan for review.  Cc: Rosa for referral. [facility] Dr Therisa Doyne referral placed.

## 2019-07-07 NOTE — Telephone Encounter (Signed)
Called placed to [facility] and I  spoke with Alexie. She states for colonoscopy the records must be reviewed first before scheduling. I have faxed the referral and records thru Epic to [phone]. Once received and reviewed they will call the patient for scheduling.

## 2019-07-08 NOTE — Telephone Encounter (Signed)
Spoke with pt. Pt updated on referral to [facility] with Dr Therisa Doyne. Pt agreeable and verbalized understanding. Pt to return call to our office if not received phone call to schedule consult appt at [facility]. Pt agreeable.   Routing to Dr Talbert Nan for review  Encounter closed.

## 2019-07-23 ENCOUNTER — Telehealth: Payer: Self-pay | Admitting: Obstetrics and Gynecology

## 2019-07-23 ENCOUNTER — Encounter: Payer: Self-pay | Admitting: Obstetrics and Gynecology

## 2019-07-23 DIAGNOSIS — R9389 Abnormal findings on diagnostic imaging of other specified body structures: Secondary | ICD-10-CM

## 2019-07-23 DIAGNOSIS — Z85038 Personal history of other malignant neoplasm of large intestine: Secondary | ICD-10-CM

## 2019-07-23 DIAGNOSIS — R140 Abdominal distension (gaseous): Secondary | ICD-10-CM

## 2019-07-23 DIAGNOSIS — K219 Gastro-esophageal reflux disease without esophagitis: Secondary | ICD-10-CM

## 2019-07-23 DIAGNOSIS — Z1211 Encounter for screening for malignant neoplasm of colon: Secondary | ICD-10-CM

## 2019-07-23 NOTE — Telephone Encounter (Signed)
Left message to call Baylee Mccorkel, RN at [facility] [phone].   

## 2019-07-23 NOTE — Telephone Encounter (Signed)
Sarah Barry [facility]  Phone Number: [phone]  Hi,  I realized that my Gasto referral was to [facility] and I would very much not like to go back to that office.  I wasn't happy with the doctors nor the way I was treated by the receptionist and the manager. That was another reason Dr. Hollice Espy had mentioned me going to Dr. Collene Mares. Thank you

## 2019-07-27 NOTE — Telephone Encounter (Signed)
Left message to call Sharee Pimple, RN at [facility].    Patient has been to [facility] and [facility] in the past, referral to Dr. Collene Mares denied. Can place referral to provider of patients choice.

## 2019-07-29 NOTE — Telephone Encounter (Signed)
Spoke with pt. Pt states not wanting GI referral to [facility]. See pt message.  Pt asking for new referral to [facility] GI.  New referral placed for Dr Carlean Purl. Pt agreeable.   Routing to Dr Talbert Nan for update on referral.  Cc: Rosa for referral Encounter closed.

## 2019-07-30 ENCOUNTER — Telehealth: Payer: Self-pay

## 2019-07-30 ENCOUNTER — Telehealth: Payer: Self-pay | Admitting: Gastroenterology

## 2019-07-30 NOTE — Telephone Encounter (Signed)
Patient is returning call to [place]. No further information given.

## 2019-07-30 NOTE — Telephone Encounter (Signed)
See previous encounter dated 07/23/19.  This encounter closed

## 2019-07-30 NOTE — Telephone Encounter (Signed)
Hey Dr. Fuller Plan- patient is being referred to [place], she is a previous patient of yours- last time she was seen was 2014. Patient is asking to switch from you to Dr. Carlean Purl. 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 Carlean Purl and wanted him as her doctor. Will you okay the switch?

## 2019-07-30 NOTE — Telephone Encounter (Signed)
Hey Dr. Carlean Purl, patient wants to transfer care to you from Dr. Fuller Plan. 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 Dr. Fuller Plan has okayed the switch. Please advise if you will accept patient. Thank you!

## 2019-07-30 NOTE — Telephone Encounter (Signed)
OK with me if OK with CG.

## 2019-07-30 NOTE — Telephone Encounter (Signed)
OK I will see her 

## 2019-08-03 NOTE — Telephone Encounter (Signed)
Left message for patient to call back and schedule.

## 2019-08-09 NOTE — Telephone Encounter (Signed)
Patient scheduled for 10/05/19

## 2019-08-25 ENCOUNTER — Other Ambulatory Visit: Payer: Self-pay | Admitting: Obstetrics and Gynecology

## 2019-08-25 DIAGNOSIS — Z3009 Encounter for other general counseling and advice on contraception: Secondary | ICD-10-CM

## 2019-08-25 NOTE — Telephone Encounter (Signed)
Medication refill request: Micronor Last AEX:  03/24/19 DL Next OV: 09/21/19 JJ Last MMG (if hormonal medication request): 07/25/17 BIRADS 1 negative/density c Refill authorized: Today, please advise; order pended #28 w/0 refills if authorized until patient's f/u appt.

## 2019-09-08 ENCOUNTER — Telehealth: Payer: Self-pay | Admitting: Obstetrics and Gynecology

## 2019-09-08 NOTE — Telephone Encounter (Signed)
Spoke with patient regarding benefits for scheduled Sonohysterogram. Patient acknowledges understanding of information presented. Patient is aware of cancellation policy. Encounter closed.

## 2019-09-11 ENCOUNTER — Other Ambulatory Visit: Payer: Self-pay | Admitting: Obstetrics and Gynecology

## 2019-09-11 DIAGNOSIS — Z3009 Encounter for other general counseling and advice on contraception: Secondary | ICD-10-CM

## 2019-09-15 ENCOUNTER — Other Ambulatory Visit: Payer: Self-pay | Admitting: Obstetrics and Gynecology

## 2019-09-15 DIAGNOSIS — Z8619 Personal history of other infectious and parasitic diseases: Secondary | ICD-10-CM

## 2019-09-15 NOTE — Telephone Encounter (Signed)
Medication refill request: Valtrex Last AEX:  03/24/19 JJ Next AEX: 09/21/19 Last MMG (if hormonal medication request): n/a Refill authorized: today, please advise

## 2019-09-21 ENCOUNTER — Ambulatory Visit (INDEPENDENT_AMBULATORY_CARE_PROVIDER_SITE_OTHER): Payer: 59 | Admitting: Obstetrics and Gynecology

## 2019-09-21 ENCOUNTER — Other Ambulatory Visit: Payer: Self-pay

## 2019-09-21 ENCOUNTER — Other Ambulatory Visit: Payer: Self-pay | Admitting: Obstetrics and Gynecology

## 2019-09-21 ENCOUNTER — Encounter: Payer: Self-pay | Admitting: Obstetrics and Gynecology

## 2019-09-21 ENCOUNTER — Ambulatory Visit (INDEPENDENT_AMBULATORY_CARE_PROVIDER_SITE_OTHER): Payer: 59

## 2019-09-21 VITALS — BP 120/76 | HR 81 | Wt 135.8 lb

## 2019-09-21 DIAGNOSIS — N938 Other specified abnormal uterine and vaginal bleeding: Secondary | ICD-10-CM | POA: Diagnosis not present

## 2019-09-21 DIAGNOSIS — N921 Excessive and frequent menstruation with irregular cycle: Secondary | ICD-10-CM

## 2019-09-21 DIAGNOSIS — N840 Polyp of corpus uteri: Secondary | ICD-10-CM | POA: Diagnosis not present

## 2019-09-21 DIAGNOSIS — N9489 Other specified conditions associated with female genital organs and menstrual cycle: Secondary | ICD-10-CM

## 2019-09-21 DIAGNOSIS — N946 Dysmenorrhea, unspecified: Secondary | ICD-10-CM

## 2019-09-21 NOTE — Progress Notes (Signed)
GYNECOLOGY  VISIT   HPI: 49 y.o.   Single White or Caucasian Not Hispanic or Latino  female   [id number] with No LMP recorded.   here for a follow up ultrasound and micronor f/u. She was incidentally noted to have a thickened endometrium with a suspected polyp on ultrasound. The focus measured 1.4 cm which is just under the cut off for removal in the absence of AUB (she has normal cycles).  At her last ultrasound in April, prior ovarian cysts had resolved and the endometrial focus was stable. She was started on Micronor at that visit for contraception and is her for follow up.  Since she started the micronor she has been having AUB (not prior). Since she started the micronor cycles she had one month with 2 cycles 2 weeks apart. The other 2 months her cycles were a month apart. Bleeding x 5 days, heavier than prior to the micronor. She is changing a pad up to every 2-3 hours (prior to the micronor she was changing every 6-9 hours. Cramps were terrible on the micronor. Also had breast tenderness with the micronor. She stopped the micronor 2 days ago. Not sexually active at the moment, partner with ED issues.   GYNECOLOGIC HISTORY: No LMP recorded. Contraception:none, abstaining Menopausal hormone therapy: none        OB History    Gravida  0   Para  0   Term  0   Preterm  0   AB  0   Living  0     SAB  0   TAB  0   Ectopic  0   Multiple  0   Live Births                 Patient Active Problem List   Diagnosis Date Noted  . History of food allergy 12/27/2014  . Chronic rhinitis 12/27/2014  . Gastritis 12/27/2014  . 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 Excessive tear production of left lacrimal gland 05/10/2014  . Endometrial polyp 07/23/2013    Class: History of  . Enlarged uterus 05/19/2013  . Dysmenorrhea 05/19/2013    Class: History of  . Menorrhagia with regular cycle 05/19/2013    Class: History of   . Tobacco use disorder 04/27/2012  . Iron deficiency anemia 01/24/2012  . Depressive disorder, not elsewhere classified 01/23/2012  . GERD (gastroesophageal reflux disease) 01/23/2012  . BACK PAIN, UPPER 08/15/2009  . TRIGGER FINGER, RIGHT THUMB 08/15/2009  . BIPOLAR I, MIXED, MOST RECENT EPSD NOS 08/04/2006  . CHOLECYSTECTOMY, HX OF 08/04/2006    Past Medical History:  Diagnosis Date  . Abnormal Pap smear of cervix   . Allergic rhinitis   . Anemia   . Bipolar disorder ([place])   . Cancer (HCC)    skin ca - buttocks  . Chronic headache    during cycle  . Colon polyp 04/2012   tubular adenoma  . Depression   . Dysmenorrhea 05/19/2013  . Fatigue   . Fibroid   . Folliculitis   . GERD (gastroesophageal reflux disease)   . HSV infection   . Positive H. pylori test   . STD (sexually transmitted disease)    genital warts  . Thrombocytosis ([place])   . Tongue disorder     Past Surgical History:  Procedure Laterality Date  . CHOLECYSTECTOMY  2000  . COLONOSCOPY  04/2012   tubular adenoma  . COLPOSCOPY  2014  . ESOPHAGOGASTRODUODENOSCOPY    . HYSTEROSCOPY WITH D & C N/A 12/01/2013   Procedure: INSERTION OF SEPRAFILM;  Surgeon: Governor Specking, MD;  Location: [place];  Service: Gynecology;  Laterality: N/A;  . MYOMECTOMY N/A 12/01/2013   Procedure: LAPAROSCOPY/GELPORT ASSISTED MYOMECTOMY/ADENOMYOMECTOMY;  Surgeon: Governor Specking, MD;  Location: [place];  Service: Gynecology;  Laterality: N/A;  . SKIN CANCER EXCISION     buttock area, Dr. Marlou Starks    Current Outpatient Medications  Medication Sig Dispense Refill  . acetaminophen (TYLENOL) 500 MG tablet Take 500 mg by mouth daily as needed for moderate pain.    Marland Kitchen acyclovir (ZOVIRAX) 400 MG tablet as needed.    Marland Kitchen ampicillin (PRINCIPEN) 500 MG capsule Take 500 mg by mouth 2 (two) times daily as needed (acne).     Marland Kitchen ibuprofen (ADVIL) 200 MG tablet Take 600 mg by mouth daily as needed for headache  or moderate pain.    . Misc Natural Products (COLON CLEANSE) CAPS Take 2 capsules by mouth 3 (three) times a week. Take at night    . norethindrone (MICRONOR) 0.35 MG tablet TAKE 1 TABLET BY MOUTH EVERY DAY 28 tablet 0  . omeprazole (PRILOSEC) 40 MG capsule Take 40 mg by mouth daily.     No current facility-administered medications for this visit.     ALLERGIES: Patient has no known allergies.  Family History  Problem Relation Age of Onset  . Colon cancer Maternal Grandfather   . Cancer Maternal Grandfather        colon cancer  . Depression Maternal Grandfather   . Diabetes Paternal Grandmother   . Alcohol abuse Father   . Heart disease Father 43       MI  . Hyperlipidemia Father   . Hypertension Father   . Depression Father   . Osteoporosis Maternal Grandmother   . Dementia Mother   . Esophageal cancer Neg Hx   . Rectal cancer Neg Hx   . Stomach cancer Neg Hx     Social History   Socioeconomic History  . Marital status: Single    Spouse name: Not on file  . Number of children: 0  . Years of education: Not on file  . Highest education level: Not on file  Occupational History  . Occupation: CNA at [employer]: [employer]   Tobacco Use  . Smoking status: Current Every Day Smoker    Packs/day: 1.00    Years: 7.00    Pack years: 7.00    Types: Cigarettes  . Smokeless tobacco: Never Used  Substance and Sexual Activity  . Alcohol use: No    Alcohol/week: 0.0 standard drinks  . Drug use: No  . Sexual activity: Yes    Partners: Male    Birth control/protection: None  Other Topics Concern  . Not on file  Social History Narrative   Single.  Lives alone, has 1 dog.   Social Determinants of Health   Financial Resource Strain:   . Difficulty of Paying Living Expenses:   Food Insecurity:   . Worried About Charity fundraiser in the Last Year:   . Arboriculturist in the Last Year:   Transportation Needs:   . Film/video editor  (Medical):   Marland Kitchen Lack of Transportation (Non-Medical):   Physical Activity:   . Days of Exercise per Week:   . Minutes of Exercise per Session:   Stress:   .  Feeling of Stress :   Social Connections:   . Frequency of Communication with Friends and Family:   . Frequency of Social Gatherings with Friends and Family:   . Attends Religious Services:   . Active Member of Clubs or Organizations:   . Attends Archivist Meetings:   Marland Kitchen Marital Status:   Intimate Partner Violence:   . Fear of Current or Ex-Partner:   . Emotionally Abused:   Marland Kitchen Physically Abused:   . Sexually Abused:     ROS  PHYSICAL EXAMINATION:    There were no vitals taken for this visit.    General appearance: alert, cooperative and appears stated age  Pelvic: External genitalia:  no lesions              Urethra:  normal appearing urethra with no masses, tenderness or lesions              Bartholins and Skenes: normal                 Vagina: normal appearing vagina with normal color and discharge, no lesions              Cervix: no lesions  Sonohysterogram The procedure and risks of the procedure were reviewed with the patient, consent form was signed. A speculum was placed in the vagina and the cervix was cleansed with betadine. The sonohysterogram catheter was inserted into the uterine cavity without difficulty. Saline was infused under direct observation with the ultrasound. There is a 1.3 x 0.8 cm posterior intracavitary defect, not clear if it is a submucosal myoma or polyp.     Chaperone was present for exam.  ASSESSMENT Intracavitary endometrial mass, either polyp or small submucosal myoma. No abnormal bleeding until she started micronor, since starting the micronor she has had one early cycle, heavier bleeding and severe cramps    PLAN She has stopped the micronor She will calendar her bleeding and f/u in 3 months If she has AUB, would proceed with hysteroscopy, D&C. Otherwise will just  follow

## 2019-09-21 NOTE — Patient Instructions (Signed)
You definitely have a fibroid in the uterine wall. The small mass in your uterus could be a fibroid or a polyp.   Uterine Fibroids  Uterine fibroids (leiomyomas) are noncancerous (benign) tumors that can develop in the uterus. Fibroids may also develop in the fallopian tubes, cervix, or tissues (ligaments) near the uterus. You may have one or many fibroids. Fibroids vary in size, weight, and where they grow in the uterus. Some can become quite large. Most fibroids do not require medical treatment. What are the causes? The cause of this condition is not known. What increases the risk? You are more likely to develop this condition if you:  Are in your 30s or 40s and have not gone through menopause.  Have a family history of this condition.  Are of African-American descent.  Had your first period at an early age (early menarche).  Have not had any children (nulliparity).  Are overweight or obese. What are the signs or symptoms? Many women do not have any symptoms. Symptoms of this condition may include:  Heavy menstrual bleeding.  Bleeding or spotting between periods.  Pain and pressure in the pelvic area, between the hips.  Bladder problems, such as needing to urinate urgently or more often than usual.  Inability to have children (infertility).  Failure to carry pregnancy to term (miscarriage). How is this diagnosed? This condition may be diagnosed based on:  Your symptoms and medical history.  A physical exam.  A pelvic exam that includes feeling for any tumors.  Imaging tests, such as ultrasound or MRI. How is this treated? Treatment for this condition may include:  Seeing your health care provider for follow-up visits to monitor your fibroids for any changes.  Taking NSAIDs such as ibuprofen, naproxen, or aspirin to reduce pain.  Hormone medicines. These may be taken as a pill, given in an injection, or delivered by a T-shaped device that is inserted into the  uterus (intrauterine device, IUD).  Surgery to remove one of the following: ? The fibroids (myomectomy). Your health care provider may recommend this if fibroids affect your fertility and you want to become pregnant. ? The uterus (hysterectomy). ? Blood supply to the fibroids (uterine artery embolization). Follow these instructions at home:  Take over-the-counter and prescription medicines only as told by your health care provider.  Ask your health care provider if you should take iron pills or eat more iron-rich foods, such as dark green, leafy vegetables. Heavy menstrual bleeding can cause low iron levels.  If directed, apply heat to your back or abdomen to reduce pain. Use the heat source that your health care provider recommends, such as a moist heat pack or a heating pad. ? Place a towel between your skin and the heat source. ? Leave the heat on for 20-30 minutes. ? Remove the heat if your skin turns bright red. This is especially important if you are unable to feel pain, heat, or cold. You may have a greater risk of getting burned.  Pay close attention to your menstrual cycle. Tell your health care provider about any changes, such as: ? Increased blood flow that requires you to use more pads or tampons than usual. ? A change in the number of days that your period lasts. ? A change in symptoms that are associated with your period, such as back pain or cramps in your abdomen.  Keep all follow-up visits as told by your health care provider. This is important, especially if your fibroids need to  be monitored for any changes. Contact a health care provider if you:  Have pelvic pain, back pain, or cramps in your abdomen that do not get better with medicine or heat.  Develop new bleeding between periods.  Have increased bleeding during or between periods.  Feel unusually tired or weak.  Feel light-headed. Get help right away if you:  Faint.  Have pelvic pain that suddenly gets  worse.  Have severe vaginal bleeding that soaks a tampon or pad in 30 minutes or less. Summary  Uterine fibroids are noncancerous (benign) tumors that can develop in the uterus.  The exact cause of this condition is not known.  Most fibroids do not require medical treatment unless they affect your ability to have children (fertility).  Contact a health care provider if you have pelvic pain, back pain, or cramps in your abdomen that do not get better with medicines.  Make sure you know what symptoms should cause you to get help right away. This information is not intended to replace advice given to you by your health care provider. Make sure you discuss any questions you have with your health care provider. Document Revised: 01/17/2017 Document Reviewed: 12/31/2016 Elsevier Patient Education  2020 [employer].

## 2019-09-30 ENCOUNTER — Telehealth: Payer: Self-pay | Admitting: Obstetrics and Gynecology

## 2019-09-30 ENCOUNTER — Encounter: Payer: Self-pay | Admitting: Obstetrics and Gynecology

## 2019-09-30 NOTE — Telephone Encounter (Signed)
Sarah Barry, [facility] June 21st-June 24th I had my period   July 20th-July 24th I had my period   8 days later on  August 2nd,3rd,and 4th spotting. On the 5th,6th and 7th  my period, then on the 8th - as of today August 11 still spotting (enough to wear a thin pad) Doesn't seem to be letting up. I am concerned.

## 2019-09-30 NOTE — Telephone Encounter (Signed)
Spoke with pt. Pt sent update to Dr Talbert Nan via [facility]. Pt states having spotting from 8/8-8/11, changed panty liner every 2-3 hours. States today bleeding as thick, dark blood and changing a panty liner every 3-4 hours. Pt states stopped taking Micronor on 09/21/19 after seeing Dr Talbert Nan for PUS. Pt still having cramps that are tolerable.   Advised will review with Dr Talbert Nan and return call with recommendations. Pt agreeable. Pt has scheduled 3 month follow up on 12/23/19.  Routing to Dr Talbert Nan  PLAN -09/21/19 She has stopped the micronor She will calendar her bleeding and f/u in 3 months If she has AUB, would proceed with hysteroscopy, D&C. Otherwise will just follow

## 2019-10-01 NOTE — Telephone Encounter (Signed)
I would recommend she calendar all of her bleeding and f/u in 3 months. I'm not surprised that she is bleeding since she just stopped the micronor.  If she has prolonged or very heavy bleeding she should call back.

## 2019-10-01 NOTE — Telephone Encounter (Signed)
Left message for pt to return call to triage RN. 

## 2019-10-01 NOTE — Telephone Encounter (Signed)
Spoke with pt. Pt given results and recommendations per Dr Talbert Nan. Pt agreeable to calendar bleeding/cycles until 12/23/19 3 month follow up appt or be seen before if sx worsen or has concerns. Pt agreeable.  Encounter closed.

## 2019-10-05 ENCOUNTER — Ambulatory Visit (INDEPENDENT_AMBULATORY_CARE_PROVIDER_SITE_OTHER): Payer: 59 | Admitting: Internal Medicine

## 2019-10-05 ENCOUNTER — Encounter: Payer: Self-pay | Admitting: Internal Medicine

## 2019-10-05 VITALS — BP 120/66 | HR 80 | Ht 59.75 in | Wt 136.0 lb

## 2019-10-05 DIAGNOSIS — K5909 Other constipation: Secondary | ICD-10-CM | POA: Diagnosis not present

## 2019-10-05 DIAGNOSIS — R140 Abdominal distension (gaseous): Secondary | ICD-10-CM | POA: Insufficient documentation

## 2019-10-05 DIAGNOSIS — F488 Other specified nonpsychotic mental disorders: Secondary | ICD-10-CM

## 2019-10-05 DIAGNOSIS — R5383 Other fatigue: Secondary | ICD-10-CM

## 2019-10-05 DIAGNOSIS — R5381 Other malaise: Secondary | ICD-10-CM | POA: Diagnosis not present

## 2019-10-05 DIAGNOSIS — R4189 Other symptoms and signs involving cognitive functions and awareness: Secondary | ICD-10-CM | POA: Insufficient documentation

## 2019-10-05 NOTE — Patient Instructions (Signed)
You have been given a testing kit to check for small intestine bacterial overgrowth (SIBO) which is completed by a company named [facility]. Make sure to return your test in the mail using the return mailing label given to you along with the kit. Your demographic and insurance information have already been sent to the company and they should be in contact with you over the next week regarding this test. [facility] will collect an upfront charge of $99.74 for commercial insurance plans and $209.74 is you are paying cash. Make sure to discuss with [facility] PRIOR to having the test if they have gotten informatoin from your insurance company as to how much your testing will cost out of pocket, if any. Please keep in mind that you will be getting a call from phone number [phone] or a similar number. If you do not hear from them within this time frame, please call our office at [phone].   We are going to request your records from [facility] for review.   I appreciate the opportunity to care for you. Silvano Rusk, MD, [facility]

## 2019-10-05 NOTE — Progress Notes (Addendum)
Sarah Barry 49 y.o. 14-Apr-1970 [id number] Referred by: Sumner Boast MD  Assessment & Plan:   Encounter Diagnoses  Name Primary?  . Bloating Yes  . Chronic constipation   . Brain fog   . Malaise and fatigue     I think she is a likely candidate to have small intestinal bacterial overgrowth.  We will do a lactulose hydrogen breath test.  If that is positive we will treat with antibiotics.  She was wondering about the possibility of sucrase isomaltase deficiency but she does not have diarrhea so I do not think that is the case.  If it is not small intestinal bacterial overgrowth I am not sure what it is at this point that will be a little more difficult to sort through, but I will also request and review 2019-2020 GI records from Dr. Alessandra Bevels at [facility].  I appreciate the opportunity to care for this patient. Copy to Dr. 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  It is quite bothersome she distends and feels like she might be 9 months pregnant if you well.  It subsides overnight and then may start again the next day.  She has chronic constipation issues with successful treatment using a magnesium oxide plus verbs supplement 1 or 2 a day.  No real difficulty with defecating and no problems with genitourinary incontinence.  She has recently seen Dr. Talbert Nan for fibroids and has responded to  treatment for those (medical treatment).  Other associated symptoms are brain fog and fatigue.  And if she has extreme bloating at night sometimes she will have what sounds like nocturnal reflux symptoms.  The bloating at night would be if she eats close to bedtime.  GI history notable for 6 mm adenoma in 2014 (Dr. Fuller Plan) records review of that shows that previously she had had a EGD and esophageal dilation for GERD with stricture in 2011 at [facility].  She was using MiraLAX as needed 2013.  Also some chronic fatigue issues history of iron deficiency anemia.  Was given a trial of Linzess.  It was thought that bloating might be related to weight gain.  She did not really improve regarding bloating with Linzess or Amitiza Gas-X and diet changes.  In 2019 through 2020 she saw Dr. Alessandra Bevels at [facility].  She says she was tested for celiac disease and that was negative.  I do not have those records but we are requesting them.  It sounds like she had a repeat colonoscopy through him as well.  A CT scan demonstrated moderate hiatal hernia otherwise normal GI tract, status post cholecystectomy, globular uterus consistent with leiomyomata.  This is from 06/15/2018.  As far as GERD she says she has been on "all PPIs at some point".  Currently on omeprazole 40 mg daily with good  control of symptoms.  She says she has been on a PPI for about 20 years.  Wt Readings from Last 3 Encounters:  10/05/19 136 lb (61.7 kg)  09/21/19 135 lb 12.8 oz (61.6 kg)  06/08/19 135 lb (61.2 kg)   GI review of systems is otherwise negative no dysphagia at this time.   No Known Allergies Current Meds  Medication Sig  . acyclovir (ZOVIRAX) 400 MG tablet as needed.  Marland Kitchen ampicillin (PRINCIPEN) 500 MG capsule Take 500 mg by mouth 2 (two) times daily as needed (acne).   . Cholecalciferol (VITAMIN D3) 250 MCG (10000 UT) TABS Take 1 tablet by mouth daily.  Marland Kitchen DIGESTIVE ENZYMES PO Take 1 tablet by mouth 3 (three) times daily before  meals.  . Misc Natural Products (COLON CLEANSE) CAPS Take 2 capsules by mouth 3 (three) times a week. Take at night  . Multiple Vitamin (MULTIVITAMIN) tablet Take 1 tablet by mouth daily.  Marland Kitchen omeprazole (PRILOSEC) 40 MG capsule Take 40 mg by mouth daily.  . Turmeric (CURCUMIN 95 PO) Take 1 tablet by mouth daily.   Past Medical History:  Diagnosis Date  . Abnormal Pap smear of cervix   . Allergic rhinitis   . Anemia   . Bipolar disorder ([place])   . Cancer (HCC)    skin ca - buttocks  . Chronic headache    during cycle  . Colon polyp 04/2012   tubular adenoma  . Depression   . Dysmenorrhea 05/19/2013  . Fatigue   . Fibroid   . Folliculitis   . GERD (gastroesophageal reflux disease)   . HSV infection   . Positive H. pylori test   . STD (sexually transmitted disease)    genital warts  . Thrombocytosis ([place])   . Tongue disorder    Past Surgical History:  Procedure Laterality Date  . CHOLECYSTECTOMY  2000  . COLONOSCOPY  04/2012   tubular adenoma  . COLPOSCOPY  2014  . ESOPHAGOGASTRODUODENOSCOPY    . HYSTEROSCOPY WITH D & C N/A 12/01/2013   Procedure: INSERTION OF SEPRAFILM;  Surgeon: Governor Specking, MD;  Location: [facility];  Service: Gynecology;  Laterality: N/A;  . MYOMECTOMY N/A 12/01/2013   Procedure: LAPAROSCOPY/GELPORT ASSISTED MYOMECTOMY/ADENOMYOMECTOMY;  Surgeon: Governor Specking, MD;  Location: [place];  Service: Gynecology;  Laterality: N/A;  . SKIN CANCER EXCISION     buttock area, Dr. Marlou Starks   Social History   Social History Narrative   Single.  No children lives alone, has 1 dog.   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   2 caffeinated beverages daily   She is a smoker of cigarettes but no other tobacco no drug use and no alcohol   family history includes Alcohol abuse in her father; Cancer in her maternal grandfather; Colon cancer in her maternal grandfather;  Dementia in her mother; Depression in her father and maternal grandfather; Diabetes in her paternal grandmother; Heart disease (age of onset: 76) in her father; Hyperlipidemia in her father; Hypertension in her father; Osteoporosis in her maternal grandmother.   Review of Systems As per HPI some sore breasts some back pain all other review of systems negative Objective:   Physical Exam BP 120/66   Pulse 80   Ht 4' 11.75" (1.518 m)   Wt 136 lb (61.7 kg)   LMP 09/21/2019 (Exact Date)   BMI 26.78 kg/m  No acute distress Well-developed well-nourished white woman  Eyes anicteric Lungs are clear Heart sounds are normal Abdomen is soft without organomegaly or mass nontender no hernias.  Some mild abdominal obesity. Alert and oriented x3 appropriate mood and affect  Data reviewed see HPI Normal TSH in February kidney function normal as well electrolytes normal

## 2019-11-15 ENCOUNTER — Telehealth: Payer: Self-pay

## 2019-11-15 NOTE — Telephone Encounter (Signed)
Patient sent the following message via MyChart.  Appointment Request From: Sarah Barry  With Provider: Salvadore Dom, MD [facility]]  Preferred Date Range: Any  Preferred Times: Monday Morning, Monday Afternoon, Tuesday Morning, Tuesday Afternoon, Wednesday Morning, Wednesday Afternoon, Thursday Morning, Thursday Afternoon, Friday Morning, Friday Afternoon  Reason for visit: Request an Appointment  Comments: Start a birth control again.  Something other than the pill.  Appointment earliest in the morning or latest in the evening.  Which ever comes first. Thank you!

## 2019-11-15 NOTE — Telephone Encounter (Signed)
Left message for pt to return call to triage RN. 

## 2019-11-15 NOTE — Telephone Encounter (Signed)
Patient sent the following message via MyChart.  Do I need to call to get this appointment?

## 2019-11-16 NOTE — Telephone Encounter (Signed)
Left message for pt to return call to triage RN. 

## 2019-11-17 NOTE — Telephone Encounter (Signed)
Spoke with pt. Pt states wants to change birth control method, but will wait til OV on 11/4 to discuss with Dr Talbert Nan due to being busy with work schedule and out of town soon.  Encounter closed.

## 2019-11-30 ENCOUNTER — Encounter: Payer: Self-pay | Admitting: Internal Medicine

## 2019-11-30 DIAGNOSIS — K638219 Small intestinal bacterial overgrowth, unspecified: Secondary | ICD-10-CM

## 2019-11-30 DIAGNOSIS — K6389 Other specified diseases of intestine: Secondary | ICD-10-CM

## 2019-11-30 HISTORY — DX: Other specified diseases of intestine: K63.89

## 2019-11-30 HISTORY — DX: Small intestinal bacterial overgrowth, unspecified: K63.8219

## 2019-12-09 ENCOUNTER — Encounter: Payer: Self-pay | Admitting: Obstetrics and Gynecology

## 2019-12-09 ENCOUNTER — Telehealth: Payer: Self-pay

## 2019-12-09 NOTE — Telephone Encounter (Signed)
Pt sent following mychart message:  Sarah Barry, [name] saying [facility] do not have a prescription for my Valacyclovir. Could you please get them to refill it please. [place].  I got it refilled on 08/24/19 so I don't know why they say they don't have it. Thank you!

## 2019-12-09 NOTE — Telephone Encounter (Signed)
Call placed to [facility] to inquire about Rx Valtrex. Valtrex Rx was written 09/15/19 # 30, 2 RF.  Spoke with Rob, states Rx was last filled on 08/24/19. Rob has now placed RF for pt. Will send pt mychart message back with information.  Encounter closed.

## 2019-12-17 ENCOUNTER — Other Ambulatory Visit: Payer: Self-pay | Admitting: Obstetrics and Gynecology

## 2019-12-17 DIAGNOSIS — Z8619 Personal history of other infectious and parasitic diseases: Secondary | ICD-10-CM

## 2019-12-17 NOTE — Telephone Encounter (Signed)
Medication refill request: Valacyclovir 500mg   Last AEX:  03/24/19 Next OV: 12/23/19  Last MMG (if hormonal medication request): NA Refill authorized: 90/0

## 2019-12-23 ENCOUNTER — Encounter: Payer: Self-pay | Admitting: Obstetrics and Gynecology

## 2019-12-23 ENCOUNTER — Ambulatory Visit (INDEPENDENT_AMBULATORY_CARE_PROVIDER_SITE_OTHER): Payer: 59 | Admitting: Obstetrics and Gynecology

## 2019-12-23 ENCOUNTER — Other Ambulatory Visit: Payer: Self-pay

## 2019-12-23 VITALS — BP 130/64 | HR 84 | Ht 60.0 in | Wt 138.0 lb

## 2019-12-23 DIAGNOSIS — Z8742 Personal history of other diseases of the female genital tract: Secondary | ICD-10-CM | POA: Diagnosis not present

## 2019-12-23 DIAGNOSIS — Z3009 Encounter for other general counseling and advice on contraception: Secondary | ICD-10-CM

## 2019-12-23 NOTE — Progress Notes (Signed)
GYNECOLOGY  VISIT   HPI: 49 y.o.   Single White or Caucasian Not Hispanic or Latino  female   [id number] with No LMP recorded.   here for 3 month follow up AUB  She says that her cycles went back to normal in September.    In the spring she was incidentally noted to have a 1.4 endometrial mass on ultrasound. At that time she didn't have any abnormal bleeding. She was started on micronor for contraception and developed some AUB on micronor.  Sonohysterogram in 8/21 showed a 1.3 cm posterior intracavitary defect, not clear if it was a myoma or polyp. She stopped the micronor and is here for f/u.  Normal cycle in 9/21 and 10/21. Cycles were a month apart x 4-5 days. She saturated a super 2-3 x a day. No BTB or cramping.  She is wanting contraception. Has been with her partner x 6 months. He has some ED.   GYNECOLOGIC HISTORY: No LMP recorded. Contraception:none  Menopausal hormone therapy: none         OB History    Gravida  0   Para  0   Term  0   Preterm  0   AB  0   Living  0     SAB  0   TAB  0   Ectopic  0   Multiple  0   Live Births                 Patient Active Problem List   Diagnosis Date Noted  . Small intestinal bacterial overgrowth (SIBO) 11/30/2019  . Bloating 10/05/2019  . Chronic constipation 10/05/2019  . Brain fog 10/05/2019  . History of food allergy 12/27/2014  . Chronic rhinitis 12/27/2014  . Gastritis 12/27/2014  . 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 Excessive tear production of left lacrimal gland 05/10/2014  . Endometrial polyp 07/23/2013    Class: History of  . Enlarged uterus 05/19/2013  . Dysmenorrhea 05/19/2013    Class: History of  . Menorrhagia with regular cycle 05/19/2013    Class: History of  . Tobacco use disorder 04/27/2012  . Iron deficiency anemia 01/24/2012  . Depressive disorder, not elsewhere classified 01/23/2012  . GERD (gastroesophageal reflux  disease) 01/23/2012  . BACK PAIN, UPPER 08/15/2009  . TRIGGER FINGER, RIGHT THUMB 08/15/2009  . BIPOLAR I, MIXED, MOST RECENT EPSD NOS 08/04/2006  . CHOLECYSTECTOMY, HX OF 08/04/2006    Past Medical History:  Diagnosis Date  . Abnormal Pap smear of cervix   . Allergic rhinitis   . Anemia   . Bipolar disorder ([place])   . Cancer (HCC)    skin ca - buttocks  . Chronic headache    during cycle  . Colon polyp 04/2012   tubular adenoma  . Depression   . Dysmenorrhea 05/19/2013  . Fatigue   . Fibroid   . Folliculitis   . GERD (gastroesophageal reflux disease)   . HSV infection   . Positive H. pylori test   . Small intestinal bacterial overgrowth (SIBO) 11/30/2019  . STD (sexually transmitted disease)    genital warts  . Thrombocytosis   . Tongue disorder     Past Surgical History:  Procedure Laterality Date  . CHOLECYSTECTOMY  2000  . COLONOSCOPY  04/2012   tubular adenoma  . COLPOSCOPY  2014  . ESOPHAGOGASTRODUODENOSCOPY    . HYSTEROSCOPY WITH D & C N/A 12/01/2013   Procedure:  INSERTION OF SEPRAFILM;  Surgeon: Governor Specking, MD;  Location: [facility];  Service: Gynecology;  Laterality: N/A;  . MYOMECTOMY N/A 12/01/2013   Procedure: LAPAROSCOPY/GELPORT ASSISTED MYOMECTOMY/ADENOMYOMECTOMY;  Surgeon: Governor Specking, MD;  Location: [facility];  Service: Gynecology;  Laterality: N/A;  . SKIN CANCER EXCISION     buttock area, Dr. Marlou Starks    Current Outpatient Medications  Medication Sig Dispense Refill  . acyclovir (ZOVIRAX) 400 MG tablet as needed.    Marland Kitchen ampicillin (PRINCIPEN) 500 MG capsule Take 500 mg by mouth 2 (two) times daily as needed (acne).     . Cholecalciferol (VITAMIN D3) 250 MCG (10000 UT) TABS Take 1 tablet by mouth daily.    Marland Kitchen DIGESTIVE ENZYMES PO Take 1 tablet by mouth 3 (three) times daily before meals.    . Misc Natural Products (COLON CLEANSE) CAPS Take 2 capsules by mouth 3 (three) times a week. Take at night    . Multiple  Vitamin (MULTIVITAMIN) tablet Take 1 tablet by mouth daily.    Marland Kitchen omeprazole (PRILOSEC) 40 MG capsule Take 40 mg by mouth daily.    . Turmeric (CURCUMIN 95 PO) Take 1 tablet by mouth daily.     No current facility-administered medications for this visit.     ALLERGIES: Patient has no known allergies.  Family History  Problem Relation Age of Onset  . Colon cancer Maternal Grandfather   . Cancer Maternal Grandfather        colon cancer  . Depression Maternal Grandfather   . Diabetes Paternal Grandmother   . Alcohol abuse Father   . Heart disease Father 59       MI  . Hyperlipidemia Father   . Hypertension Father   . Depression Father   . Osteoporosis Maternal Grandmother   . Dementia Mother   . Esophageal cancer Neg Hx   . Rectal cancer Neg Hx   . Stomach cancer Neg Hx     Social History   Socioeconomic History  . Marital status: Single    Spouse name: Not on file  . Number of children: 0  . Years of education: Not on file  . Highest education level: Not on file  Occupational History  . Occupation: CNA at [employer]: [employer]   Tobacco Use  . Smoking status: Current Every Day Smoker    Packs/day: 1.00    Years: 7.00    Pack years: 7.00    Types: Cigarettes  . Smokeless tobacco: Never Used  Substance and Sexual Activity  . Alcohol use: No    Alcohol/week: 0.0 standard drinks  . Drug use: No  . Sexual activity: Yes    Partners: Male    Birth control/protection: None  Other Topics Concern  . Not on file  Social History Narrative   Single.  No children lives alone, has 1 dog.   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   2 caffeinated beverages daily   She is a smoker of cigarettes but no other tobacco no drug use and no alcohol   Social Determinants of Health   Financial Resource Strain:   . Difficulty of Paying Living Expenses: Not on file  Food Insecurity:   .  Worried About Charity fundraiser in the Last Year: Not on file  . Ran Out of Food in the Last Year: Not on file  Transportation Needs:   .  Lack of Transportation (Medical): Not on file  . Lack of Transportation (Non-Medical): Not on file  Physical Activity:   . Days of Exercise per Week: Not on file  . Minutes of Exercise per Session: Not on file  Stress:   . Feeling of Stress : Not on file  Social Connections:   . Frequency of Communication with Friends and Family: Not on file  . Frequency of Social Gatherings with Friends and Family: Not on file  . Attends Religious Services: Not on file  . Active Member of Clubs or Organizations: Not on file  . Attends Archivist Meetings: Not on file  . Marital Status: Not on file  Intimate Partner Violence:   . Fear of Current or Ex-Partner: Not on file  . Emotionally Abused: Not on file  . Physically Abused: Not on file  . Sexually Abused: Not on file    Review of Systems  All other systems reviewed and are negative.   PHYSICAL EXAMINATION:    There were no vitals taken for this visit.    General appearance: alert, cooperative and appears stated age   ASSESSMENT F/U AUB, resolved off of micronor Incidental finding of 1.4 cm intracavitary fibroid or polyp, not symptomatic, treatment not needed Contraception, didn't like micronor, not a candidate for OCP's (smoker), not interested in depo-provera or sterilization, condoms are difficult secondary to ED Partner with ED, printed up information from [facility]    PLAN Discussed possible mirena IUD for contraception, information given Will discuss contraception with her partner as well Information on ED given

## 2019-12-31 ENCOUNTER — Other Ambulatory Visit: Payer: Self-pay | Admitting: Internal Medicine

## 2019-12-31 MED ORDER — NEOMYCIN SULFATE 500 MG PO TABS: 500.0000 mg | ORAL_TABLET | Freq: Two times a day (BID) | ORAL | 0 refills | Status: AC

## 2019-12-31 MED ORDER — AMOXICILLIN-POT CLAVULANATE 875-125 MG PO TABS: 1.0000 | ORAL_TABLET | Freq: Two times a day (BID) | ORAL | 0 refills | Status: DC

## 2020-02-01 ENCOUNTER — Other Ambulatory Visit: Payer: Self-pay | Admitting: Internal Medicine

## 2020-02-01 MED ORDER — METRONIDAZOLE 250 MG PO TABS: 250.0000 mg | ORAL_TABLET | Freq: Three times a day (TID) | ORAL | 0 refills | Status: AC

## 2020-02-01 MED ORDER — DOXYCYCLINE HYCLATE 100 MG PO TABS: 100.0000 mg | ORAL_TABLET | Freq: Two times a day (BID) | ORAL | 0 refills | Status: AC

## 2020-02-23 ENCOUNTER — Other Ambulatory Visit: Payer: Self-pay | Admitting: Internal Medicine

## 2020-02-23 MED ORDER — OMEPRAZOLE 40 MG PO CPDR
DELAYED_RELEASE_CAPSULE | ORAL | 1 refills | Status: DC
Start: 2020-02-23 — End: 2020-10-23

## 2020-03-02 ENCOUNTER — Encounter: Payer: Self-pay | Admitting: Obstetrics and Gynecology

## 2020-03-07 ENCOUNTER — Ambulatory Visit: Payer: 59 | Admitting: Internal Medicine

## 2020-03-15 ENCOUNTER — Other Ambulatory Visit: Payer: Self-pay

## 2020-03-15 ENCOUNTER — Ambulatory Visit (INDEPENDENT_AMBULATORY_CARE_PROVIDER_SITE_OTHER): Payer: 59 | Admitting: Internal Medicine

## 2020-03-15 ENCOUNTER — Encounter: Payer: Self-pay | Admitting: Internal Medicine

## 2020-03-15 VITALS — BP 118/62 | HR 104 | Ht 59.5 in | Wt 144.5 lb

## 2020-03-15 DIAGNOSIS — K6389 Other specified diseases of intestine: Secondary | ICD-10-CM

## 2020-03-15 DIAGNOSIS — K5909 Other constipation: Secondary | ICD-10-CM | POA: Diagnosis not present

## 2020-03-15 DIAGNOSIS — R140 Abdominal distension (gaseous): Secondary | ICD-10-CM | POA: Diagnosis not present

## 2020-03-15 NOTE — Progress Notes (Signed)
Sarah Barry 50 y.o. 12-Jul-1970 [id number]  Assessment & Plan:   Encounter Diagnoses  Name Primary?  . Small intestinal bacterial overgrowth (SIBO) Yes  . Chronic constipation   . Bloating    I have treated her small intestinal bacterial overgrowth 3 separate times and she really is not any better.  Need to determine if the small intestinal bacterial overgrowth test is still abnormal so we will retest.  We will also refer to a dietitian to see what we might come up with to help her there.  Clearly there are some food intolerances.  Could consider testing for fructose malabsorption as well.  Would expect more of a diarrhea issue but still something to consider.  Consider trial of Motegrity samples but I want to work on the SIBO and food/diet angles first. We will also keep pelvic floor physical therapy in mind  I appreciate the opportunity to care for this patient. CC: Sarah Shanks, MD    Subjective:   Chief Complaint: Bloating, SIBO  HPI Sarah Barry is here for follow-up of bloating and chronic constipation.  She had an abnormal lactulose hydrogen breath test indicative of small intestinal bacterial overgrowth and in October she was treated with 14 days of Xifaxan 550 mg 3 times daily.  That did not help her at all.  I then tried doxycycline and metronidazole in December taken for 10 days.  Still no benefit though one time she was able to drink a Pepsi and did not feel terribly bloated as typical but otherwise no real help.  As far as lactose is concerned milk products like ice cream can bother her the most of what she thinks causes problems is linked to sugar.  She is not able to eat pineapple mangoes and papaya without symptoms of severe bloating.  She can tolerate berries.  She does eat beans but does not seem to be a particular trigger.  She typically avoids eating while she is at work so she is not so bloated.   Wt Readings from Last 3 Encounters:  03/15/20 144 lb 8 oz  (65.5 kg)  12/23/19 138 lb (62.6 kg)  10/05/19 136 lb (61.7 kg)    No Known Allergies Current Meds  Medication Sig  . acyclovir (ZOVIRAX) 400 MG tablet as needed.  . Cholecalciferol (VITAMIN D3) 250 MCG (10000 UT) TABS Take 1 tablet by mouth daily.  . Misc Natural Products (COLON CLEANSE) CAPS Take 2 capsules by mouth 3 (three) times a week. Take at night  . Multiple Vitamin (MULTIVITAMIN) tablet Take 1 tablet by mouth daily.  Marland Kitchen omeprazole (PRILOSEC) 40 MG capsule 1 TABLET 30 MINUTES BEFORE MORNING MEAL ONCE A DAY  . Turmeric (CURCUMIN 95 PO) Take 1 tablet by mouth daily.   Past Medical History:  Diagnosis Date  . Abnormal Pap smear of cervix   . Allergic rhinitis   . Anemia   . Bipolar disorder ([place])   . Cancer (HCC)    skin ca - buttocks  . Chronic headache    during cycle  . Colon polyp 04/2012   tubular adenoma  . Depression   . Dysmenorrhea 05/19/2013  . Fatigue   . Fibroid   . Folliculitis   . GERD (gastroesophageal reflux disease)   . HSV infection   . Positive H. pylori test   . Small intestinal bacterial overgrowth (SIBO) 11/30/2019  . STD (sexually transmitted disease)    genital warts  . Thrombocytosis   . Tongue disorder  Past Surgical History:  Procedure Laterality Date  . CHOLECYSTECTOMY  2000  . COLONOSCOPY  04/2012   tubular adenoma  . COLPOSCOPY  2014  . ESOPHAGOGASTRODUODENOSCOPY    . HYSTEROSCOPY WITH D & C N/A 12/01/2013   Procedure: INSERTION OF SEPRAFILM;  Surgeon: Governor Specking, MD;  Location: [facility];  Service: Gynecology;  Laterality: N/A;  . MYOMECTOMY N/A 12/01/2013   Procedure: LAPAROSCOPY/GELPORT ASSISTED MYOMECTOMY/ADENOMYOMECTOMY;  Surgeon: Governor Specking, MD;  Location: [facility];  Service: Gynecology;  Laterality: N/A;  . SKIN CANCER EXCISION     buttock area, Dr. Marlou Starks   Social History   Social History Narrative   Single.  No children lives alone, has 1 dog.   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   2 caffeinated beverages daily   She is a smoker of cigarettes but no other tobacco no drug use and no alcohol   family history includes Alcohol abuse in her father; Cancer in her maternal grandfather; Colon cancer in her maternal grandfather; Dementia in her mother; Depression in her father and maternal grandfather; Diabetes in her paternal grandmother; Heart disease (age of onset: 70) in her father; Hyperlipidemia in her father; Hypertension in her father; Osteoporosis in her maternal grandmother.   Review of Systems As per HPI  Objective:   Physical Exam BP 118/62 (BP Location: Left Arm, Patient Position: Sitting, Cuff Size: Normal)   Pulse (!) 104   Ht 4' 11.5" (1.511 m) Comment: height measured without shoes  Wt 144 lb 8 oz (65.5 kg)   LMP 02/25/2020   BMI 28.70 kg/m  Well-developed well-nourished no acute distress moderate abdominal distention soft and nontender

## 2020-03-15 NOTE — Patient Instructions (Signed)
You have been given a testing kit to check for small intestine bacterial overgrowth (SIBO) which is completed by a company named [facility]. Make sure to return your test in the mail using the return mailing label given to you along with the kit. Your demographic and insurance information have already been sent to the company and they should be in contact with you over the next week regarding this test. [facility] will collect an upfront charge of $99.74 for commercial insurance plans and $209.74 is you are paying cash. Make sure to discuss with [facility] PRIOR to having the test if they have gotten informatoin from your insurance company as to how much your testing will cost out of pocket, if any. Please keep in mind that you will be getting a call from phone number [phone] or a similar number. If you do not hear from them within this time frame, please call our office at [phone].   Dr Carlean Purl will be in touch with regards to a Dietitian name.    I appreciate the opportunity to care for you. Silvano Rusk, MD, [facility]

## 2020-03-27 ENCOUNTER — Ambulatory Visit: Payer: 59 | Admitting: Certified Nurse Midwife

## 2020-05-24 ENCOUNTER — Encounter: Payer: Self-pay | Admitting: Endocrinology

## 2020-06-15 ENCOUNTER — Encounter: Payer: Self-pay | Admitting: Obstetrics and Gynecology

## 2020-06-19 MED ORDER — VALACYCLOVIR HCL 500 MG PO TABS: ORAL_TABLET | ORAL | 0 refills | Status: DC

## 2020-06-19 NOTE — Telephone Encounter (Signed)
Patient has history of taking Valtrex 500 mg tablet Rx sent.

## 2020-08-29 ENCOUNTER — Other Ambulatory Visit (HOSPITAL_COMMUNITY): Payer: Self-pay | Admitting: Family Medicine

## 2020-08-29 DIAGNOSIS — M4722 Other spondylosis with radiculopathy, cervical region: Secondary | ICD-10-CM

## 2020-09-07 ENCOUNTER — Ambulatory Visit (HOSPITAL_COMMUNITY)
Admission: RE | Admit: 2020-09-07 | Discharge: 2020-09-07 | Disposition: A | Discharge status: Home / Self Care | Payer: 59 | Source: Ambulatory Visit | Attending: Family Medicine | Admitting: Family Medicine

## 2020-09-07 ENCOUNTER — Ambulatory Visit (HOSPITAL_COMMUNITY): Payer: Medicaid Other

## 2020-09-07 DIAGNOSIS — M4722 Other spondylosis with radiculopathy, cervical region: Secondary | ICD-10-CM | POA: Diagnosis present

## 2020-10-18 ENCOUNTER — Encounter: Payer: Self-pay | Admitting: Orthopaedic Surgery

## 2020-10-18 ENCOUNTER — Other Ambulatory Visit: Payer: Self-pay

## 2020-10-18 ENCOUNTER — Ambulatory Visit (INDEPENDENT_AMBULATORY_CARE_PROVIDER_SITE_OTHER): Payer: 59 | Admitting: Orthopaedic Surgery

## 2020-10-18 VITALS — BP 117/86 | HR 80 | Ht 60.0 in | Wt 149.8 lb

## 2020-10-18 DIAGNOSIS — M542 Cervicalgia: Secondary | ICD-10-CM | POA: Diagnosis not present

## 2020-10-18 DIAGNOSIS — M4802 Spinal stenosis, cervical region: Secondary | ICD-10-CM

## 2020-10-18 MED ORDER — TRAMADOL HCL 50 MG PO TABS
50.0000 mg | ORAL_TABLET | Freq: Four times a day (QID) | ORAL | 0 refills | Status: DC | PRN
Start: 2020-10-18 — End: 2020-11-22

## 2020-10-18 NOTE — Progress Notes (Signed)
Office Visit Note   Patient: Sarah Barry           Date of Birth: 03/25/1970           MRN: [id number] Visit Date: 10/18/2020              Requested by: Vernie Shanks, MD [place],  [place] [id number] PCP: Vernie Shanks, MD   Assessment & Plan: Visit Diagnoses:  1. Cervical pain (neck)   2. Spinal stenosis of cervical region   3. Foraminal stenosis of cervical region     Plan: We discussed two-level cervical fusion C5-6, C6-7 with allograft and plate.  6 weeks collar immobilization.  She understands she be restricted from driving.  Operative technique discussed with the discussed dysphagia, dysphonia, possible pseudoarthrosis, possible need for second operation of this occurred although this is unlikely at less than 5%.  Questions were elicited and answered.  She will look at her schedule and call when she is ready to proceed.  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  Procedure discussed she understands request to proceed.  She will call if she has further questions.  We discussed not a good idea to take narcotic medication through December and then try to have surgery to have it fixed and sometimes patient cannot get off the narcotic medication.  She has to foot cervical epidural could be ordered we will set this up hopefully this will give her some relief I can get her through until she schedule surgery.  Follow-Up Instructions: No follow-ups on file.   Orders:  Orders Placed This Encounter  Procedures   Ambulatory referral to Physical Medicine Rehab   Meds ordered this encounter  Medications   traMADol (ULTRAM) 50 MG tablet    Sig: Take 1 tablet (50 mg total) by mouth every 6 (six) hours as needed.    Dispense:  30 tablet    Refill:  0      Procedures: No procedures performed   Clinical Data: No additional findings.   Subjective: Chief  Complaint  Patient presents with   Neck - New Patient (Initial Visit)    HPI 50 year old female new patient with chronic neck pain she states is severe rated 10 out of 10 greater than a year.  Gradually gotten worse in the last 2 months.  Patient has tried chiropractor, Motrin, meloxicam, acupuncture, Skelaxin.  She did note tramadol helped some but is concerned about having to take that on a regular basis which is appropriate.  Pain starts in the middle posteriorly in her neck radiates into her shoulders.  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  Patient states she feels like she needs to have her neck surgery done but states she is has many scheduled appointments all the way till December.  Past problems with bipolar disorder, GERD, gastritis.  Dr. Yaakov Guthrie obtained an MRI on 09/08/2020 which showed severe foraminal stenosis on the left at C6-7 and on the right at [facility].  Moderate on the right at C6-7.  Some mild changes at C3-4.  Patient has two-level cervical severe foraminal stenosis C5-6 C6-7.  Patient had prominent disc osteophyte complex at C5-6 and at C6-7 with some central narrowing both levels.  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  Review of Systems negative for stroke positive smoking history negative  cardiovascular.  All of systems noncontributory to HPI other than as mentioned above.   Objective: Vital Signs: BP 117/86 (BP Location: Left Arm, Patient Position: Sitting, Cuff Size: Normal)   Pulse 80   Ht 5' (1.524 m)   Wt 149 lb 12.8 oz (67.9 kg)   SpO2 98%   BMI 29.26 kg/m   Physical Exam Constitutional:      Appearance: She is well-developed.  HENT:     Head: Normocephalic.     Right Ear: External ear normal.     Left Ear: External ear normal. There is no impacted cerumen.  Eyes:     Pupils: Pupils are equal, round, and reactive to light.  Neck:     Thyroid: No  thyromegaly.     Trachea: No tracheal deviation.  Cardiovascular:     Rate and Rhythm: Normal rate.  Pulmonary:     Effort: Pulmonary effort is normal.  Abdominal:     Palpations: Abdomen is soft.  Musculoskeletal:     Cervical back: No rigidity.  Skin:    General: Skin is warm and dry.  Neurological:     Mental Status: She is alert and oriented to person, place, and time.  Psychiatric:        Behavior: Behavior normal.    Ortho Exam patient has bilateral brachial plexus tenderness increased pain with cervical compression no relief with distraction.  Negative Lhermitte, no lower extremity hyperreflexia no myelopathic gait changes.  Negative impingement of the shoulders.  Specialty Comments:  No specialty comments available.  Imaging: No results found.   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   Excessive tear production of left lacrimal gland 05/10/2014   Endometrial polyp 07/23/2013    Class: History of   Enlarged uterus 05/19/2013   Dysmenorrhea 05/19/2013    Class: History of   Menorrhagia with regular cycle 05/19/2013    Class: History of   Tobacco use disorder 04/27/2012   Iron deficiency anemia 01/24/2012   Depressive disorder, not elsewhere classified 01/23/2012   GERD (gastroesophageal reflux disease) 01/23/2012   BACK PAIN, UPPER 08/15/2009   TRIGGER FINGER, RIGHT THUMB 08/15/2009   BIPOLAR I, MIXED, MOST RECENT EPSD NOS 08/04/2006   CHOLECYSTECTOMY, HX OF 08/04/2006   Past Medical History:  Diagnosis Date    Abnormal Pap smear of cervix    Allergic rhinitis    Anemia    Bipolar disorder ([place])    Cancer ([place])    skin ca - buttocks   Chronic headache    during cycle   Colon polyp 04/2012   tubular adenoma   Depression    Dysmenorrhea 05/19/2013   Fatigue    Fibroid    Folliculitis    GERD (gastroesophageal reflux disease)    HSV infection    Positive H. pylori test    Small intestinal bacterial overgrowth (SIBO) 11/30/2019   STD (sexually transmitted disease)    genital warts   Thrombocytosis    Tongue  disorder     Family History  Problem Relation Age of Onset   Colon cancer Maternal Grandfather    Cancer Maternal Grandfather        colon cancer   Depression Maternal Grandfather    Diabetes Paternal Grandmother    Alcohol abuse Father    Heart disease Father 38       MI   Hyperlipidemia Father    Hypertension Father    Depression Father    Osteoporosis Maternal Grandmother    Dementia Mother    Esophageal cancer Neg Hx    Rectal cancer Neg Hx    Stomach cancer Neg Hx     Past Surgical History:  Procedure Laterality Date   CHOLECYSTECTOMY  2000   COLONOSCOPY  04/2012   tubular adenoma   COLPOSCOPY  2014   ESOPHAGOGASTRODUODENOSCOPY     HYSTEROSCOPY WITH D & C N/A 12/01/2013   Procedure: INSERTION OF SEPRAFILM;  Surgeon: Governor Specking, MD;  Location: [facility];  Service: Gynecology;  Laterality: N/A;   MYOMECTOMY N/A 12/01/2013   Procedure: LAPAROSCOPY/GELPORT ASSISTED MYOMECTOMY/ADENOMYOMECTOMY;  Surgeon: Governor Specking, MD;  Location: [facility];  Service: Gynecology;  Laterality: N/A;   SKIN CANCER EXCISION     buttock area, Dr. Marlou Starks   Social History   Occupational History   Occupation: CNA at [employer]: [employer]   Tobacco Use   Smoking status: Every Day    Packs/day: 1.00    Years: 7.00    Pack years: 7.00    Types: Cigarettes   Smokeless tobacco: Never  Substance and Sexual  Activity   Alcohol use: No    Alcohol/week: 0.0 standard drinks   Drug use: No   Sexual activity: Yes    Partners: Male    Birth control/protection: None

## 2020-10-20 DIAGNOSIS — M4802 Spinal stenosis, cervical region: Secondary | ICD-10-CM | POA: Insufficient documentation

## 2020-10-23 ENCOUNTER — Other Ambulatory Visit: Payer: Self-pay | Admitting: Internal Medicine

## 2020-10-24 ENCOUNTER — Ambulatory Visit (INDEPENDENT_AMBULATORY_CARE_PROVIDER_SITE_OTHER): Payer: 59 | Admitting: Obstetrics and Gynecology

## 2020-10-24 ENCOUNTER — Encounter: Payer: Self-pay | Admitting: Obstetrics and Gynecology

## 2020-10-24 VITALS — BP 130/78 | Ht 60.0 in | Wt 151.0 lb

## 2020-10-24 DIAGNOSIS — Z01419 Encounter for gynecological examination (general) (routine) without abnormal findings: Secondary | ICD-10-CM

## 2020-10-24 DIAGNOSIS — A6000 Herpesviral infection of urogenital system, unspecified: Secondary | ICD-10-CM

## 2020-10-24 DIAGNOSIS — N951 Menopausal and female climacteric states: Secondary | ICD-10-CM | POA: Diagnosis not present

## 2020-10-24 MED ORDER — VALACYCLOVIR HCL 500 MG PO TABS: ORAL_TABLET | ORAL | 1 refills | Status: DC

## 2020-10-24 NOTE — Patient Instructions (Signed)
EXERCISE   We recommended that you start or continue a regular exercise program for good health. Physical activity is anything that gets your body moving, some is better than none. The CDC recommends 150 minutes per week of Moderate-Intensity Aerobic Activity and 2 or more days of Muscle Strengthening Activity.  Benefits of exercise are limitless: helps weight loss/weight maintenance, improves mood and energy, helps with depression and anxiety, improves sleep, tones and strengthens muscles, improves balance, improves bone density, protects from chronic conditions such as heart disease, high blood pressure and diabetes and so much more. To learn more visit: https://www.cdc.gov/physicalactivity/index.html  DIET: Good nutrition starts with a healthy diet of fruits, vegetables, whole grains, and lean protein sources. Drink plenty of water for hydration. Minimize empty calories, sodium, sweets. For more information about dietary recommendations visit: https://health.gov/our-work/nutrition-physical-activity/dietary-guidelines and https://www.myplate.gov/  ALCOHOL:  Women should limit their alcohol intake to no more than 7 drinks/beers/glasses of wine (combined, not each!) per week. Moderation of alcohol intake to this level decreases your risk of breast cancer and liver damage.  If you are concerned that you may have a problem, or your friends have told you they are concerned about your drinking, there are many resources to help. A well-known program that is free, effective, and available to all people all over the nation is Alcoholics Anonymous.  Check out this site to learn more: https://www.aa.org/   CALCIUM AND VITAMIN D:  Adequate intake of calcium and Vitamin D are recommended for bone health.  You should be getting between 1000-1200 mg of calcium and 800 units of Vitamin D daily between diet and supplements  PAP SMEARS:  Pap smears, to check for cervical cancer or precancers,  have traditionally been  done yearly, scientific advances have shown that most women can have pap smears less often.  However, every woman still should have a physical exam from her gynecologist every year. It will include a breast check, inspection of the vulva and vagina to check for abnormal growths or skin changes, a visual exam of the cervix, and then an exam to evaluate the size and shape of the uterus and ovaries. We will also provide age appropriate advice regarding health maintenance, like when you should have certain vaccines, screening for sexually transmitted diseases, bone density testing, colonoscopy, mammograms, etc.   MAMMOGRAMS:  All women over 40 years old should have a routine mammogram.   COLON CANCER SCREENING: Now recommend starting at age 45. At this time colonoscopy is not covered for routine screening until 50. There are take home tests that can be done between 45-49.   COLONOSCOPY:  Colonoscopy to screen for colon cancer is recommended for all women at age 50.  We know, you hate the idea of the prep.  We agree, BUT, having colon cancer and not knowing it is worse!!  Colon cancer so often starts as a polyp that can be seen and removed at colonscopy, which can quite literally save your life!  And if your first colonoscopy is normal and you have no family history of colon cancer, most women don't have to have it again for 10 years.  Once every ten years, you can do something that may end up saving your life, right?  We will be happy to help you get it scheduled when you are ready.  Be sure to check your insurance coverage so you understand how much it will cost.  It may be covered as a preventative service at no cost, but you should check   your particular policy.      Breast Self-Awareness Breast self-awareness means being familiar with how your breasts look and feel. It involves checking your breasts regularly and reporting any changes to your health care provider. Practicing breast self-awareness is  important. A change in your breasts can be a sign of a serious medical problem. Being familiar with how your breasts look and feel allows you to find any problems early, when treatment is more likely to be successful. All women should practice breast self-awareness, including women who have had breast implants. How to do a breast self-exam One way to learn what is normal for your breasts and whether your breasts are changing is to do a breast self-exam. To do a breast self-exam: Look for Changes  Remove all the clothing above your waist. Stand in front of a mirror in a room with good lighting. Put your hands on your hips. Push your hands firmly downward. Compare your breasts in the mirror. Look for differences between them (asymmetry), such as: Differences in shape. Differences in size. Puckers, dips, and bumps in one breast and not the other. Look at each breast for changes in your skin, such as: Redness. Scaly areas. Look for changes in your nipples, such as: Discharge. Bleeding. Dimpling. Redness. A change in position. Feel for Changes Carefully feel your breasts for lumps and changes. It is best to do this while lying on your back on the floor and again while sitting or standing in the shower or tub with soapy water on your skin. Feel each breast in the following way: Place the arm on the side of the breast you are examining above your head. Feel your breast with the other hand. Start in the nipple area and make  inch (2 cm) overlapping circles to feel your breast. Use the pads of your three middle fingers to do this. Apply light pressure, then medium pressure, then firm pressure. The light pressure will allow you to feel the tissue closest to the skin. The medium pressure will allow you to feel the tissue that is a little deeper. The firm pressure will allow you to feel the tissue close to the ribs. Continue the overlapping circles, moving downward over the breast until you feel your  ribs below your breast. Move one finger-width toward the center of the body. Continue to use the  inch (2 cm) overlapping circles to feel your breast as you move slowly up toward your collarbone. Continue the up and down exam using all three pressures until you reach your armpit.  Write Down What You Find  Write down what is normal for each breast and any changes that you find. Keep a written record with breast changes or normal findings for each breast. By writing this information down, you do not need to depend only on memory for size, tenderness, or location. Write down where you are in your menstrual cycle, if you are still menstruating. If you are having trouble noticing differences in your breasts, do not get discouraged. With time you will become more familiar with the variations in your breasts and more comfortable with the exam. How often should I examine my breasts? Examine your breasts every month. If you are breastfeeding, the best time to examine your breasts is after a feeding or after using a breast pump. If you menstruate, the best time to examine your breasts is 5-7 days after your period is over. During your period, your breasts are lumpier, and it may be more   difficult to notice changes. When should I see my health care provider? See your health care provider if you notice: A change in shape or size of your breasts or nipples. A change in the skin of your breast or nipples, such as a reddened or scaly area. Unusual discharge from your nipples. A lump or thick area that was not there before. Pain in your breasts. Anything that concerns you. Perimenopause Perimenopause is the normal time of a woman's life when the levels of estrogen, the female hormone produced by the ovaries, begin to decrease. This leads to changes in menstrual periods before they stop completely (menopause). Perimenopause can begin 2-8 years before menopause. During perimenopause, the ovaries may or may not  produce an egg and a woman can still become pregnant. What are the causes? This condition is caused by a natural change in hormone levels that happens as you get older. What increases the risk? This condition is more likely to start at an earlier age if you have certain medical conditions or have undergone treatments, including: A tumor of the pituitary gland in the brain. A disease that affects the ovaries and hormone production. Certain cancer treatments, such as chemotherapy or hormone therapy, or radiation therapy on the pelvis. Heavy smoking and excessive alcohol use. Family history of early menopause. What are the signs or symptoms? Perimenopausal changes affect each woman differently. Symptoms of this condition may include: Hot flashes. Irregular menstrual periods. Night sweats. Changes in feelings about sex. This could be a decrease in sex drive or an increased discomfort around your sexuality. Vaginal dryness. Headaches. Mood swings. Depression. Problems sleeping (insomnia). Memory problems or trouble concentrating. Irritability. Tiredness. Weight gain. Anxiety. Trouble getting pregnant. How is this diagnosed? This condition is diagnosed based on your medical history, a physical exam, your age, your menstrual history, and your symptoms. Hormone tests may also be done. How is this treated? In some cases, no treatment is needed. You and your health care provider should make a decision together about whether treatment is necessary. Treatment will be based on your individual condition and preferences. Various treatments are available, such as: Menopausal hormone therapy (MHT). Medicines to treat specific symptoms. Acupuncture. Vitamin or herbal supplements. Before starting treatment, make sure to let your health care provider know if you have a personal or family history of: Heart disease. Breast cancer. Blood clots. Diabetes. Osteoporosis. Follow these instructions at  home: Medicines Take over-the-counter and prescription medicines only as told by your health care provider. Take vitamin supplements only as told by your health care provider. Talk with your health care provider before starting any herbal supplements. Lifestyle  Do not use any products that contain nicotine or tobacco, such as cigarettes, e-cigarettes, and chewing tobacco. If you need help quitting, ask your health care provider. Get at least 30 minutes of physical activity on 5 or more days each week. Eat a balanced diet that includes fresh fruits and vegetables, whole grains, soybeans, eggs, lean meat, and low-fat dairy. Avoid alcoholic and caffeinated beverages, as well as spicy foods. This may help prevent hot flashes. Get 7-8 hours of sleep each night. Dress in layers that can be removed to help you manage hot flashes. Find ways to manage stress, such as deep breathing, meditation, or journaling. General instructions  Keep track of your menstrual periods, including: When they occur. How heavy they are and how long they last. How much time passes between periods. Keep track of your symptoms, noting when they start, how often  you have them, and how long they last. Use vaginal lubricants or moisturizers to help with vaginal dryness and improve comfort during sex. You can still become pregnant if you are having irregular periods. Make sure you use contraception during perimenopause if you do not want to get pregnant. Keep all follow-up visits. This is important. This includes any group therapy or counseling. Contact a health care provider if: You have heavy vaginal bleeding or pass blood clots. Your period lasts more than 2 days longer than normal. Your periods are recurring sooner than 21 days. You bleed after having sex. You have pain during sex. Get help right away if you have: Chest pain, trouble breathing, or trouble talking. Severe depression. Pain when you urinate. Severe  headaches. Vision problems. Summary Perimenopause is the time when a woman's body begins to move into menopause. This may happen naturally or as a result of other health problems or medical treatments. Perimenopause can begin 2-8 years before menopause, and it can last for several years. Perimenopausal symptoms can be managed through medicines, lifestyle changes, and complementary therapies such as acupuncture. This information is not intended to replace advice given to you by your health care provider. Make sure you discuss any questions you have with your health care provider. Document Revised: 07/22/2019 Document Reviewed: 07/22/2019 Elsevier Patient Education  [place].

## 2020-10-24 NOTE — Progress Notes (Signed)
50 y.o. G0P0000 Single White or Caucasian Not Hispanic or Latino female here for annual exam.   She is still having cycles. Typically has one cycle a month. In July she bleed 7/4-7/7 and then again 7/22-7/28. Mostly cycles are monthly x 5 day. She can saturate 3 x a day. The second cycle in July was very heavy. She was changing a super tampon every couple of hours. She had pain in her pelvis after her second July cycle. Resolved on 09/20/20. Normal cycle on August 18.  Same long term partner, live together. Occasionally sexually active, no pain.   No bowel or bladder issues.   H/O HSV, occasional outbreaks. Needs Valtrex refill.   She is having a lot of neck pain, Orthopedic MD wants to do surgery. She is looking for other options.     No LMP recorded.          Sexually active: Yes.    The current method of family planning is none.    Exercising: No.   Smoker:  no, quit May 2022!  Health Maintenance: Pap:  03-24-19 normal, negative hpv History of abnormal Pap:  no MMG:  07-25-17 norm BMD:   never Colonoscopy: 2014 sessile polyp, overdue, she will reach out to her GI.  TDaP:  2013 Gardasil: N/A   reports that she quit smoking about 3 months ago. Her smoking use included cigarettes. She has never used smokeless tobacco. She reports that she does not drink alcohol and does not use drugs.No ETOH. She does nail care for the elderly.   Past Medical History:  Diagnosis Date   Abnormal Pap smear of cervix    Allergic rhinitis    Anemia    Bipolar disorder ([place])    Cancer ([facility])    skin ca - buttocks   Chronic headache    during cycle   Colon polyp 04/2012   tubular adenoma   Depression    Dysmenorrhea 05/19/2013   Fatigue    Fibroid    Folliculitis    GERD (gastroesophageal reflux disease)    HSV infection    Positive H. pylori test    Small intestinal bacterial overgrowth (SIBO) 11/30/2019   STD (sexually transmitted disease)    genital warts   Thrombocytosis    Tongue disorder      Past Surgical History:  Procedure Laterality Date   CHOLECYSTECTOMY  2000   COLONOSCOPY  04/2012   tubular adenoma   COLPOSCOPY  2014   ESOPHAGOGASTRODUODENOSCOPY     HYSTEROSCOPY WITH D & C N/A 12/01/2013   Procedure: INSERTION OF SEPRAFILM;  Surgeon: Governor Specking, MD;  Location: [facility];  Service: Gynecology;  Laterality: N/A;   MYOMECTOMY N/A 12/01/2013   Procedure: LAPAROSCOPY/GELPORT ASSISTED MYOMECTOMY/ADENOMYOMECTOMY;  Surgeon: Governor Specking, MD;  Location: [facility];  Service: Gynecology;  Laterality: N/A;   SKIN CANCER EXCISION     buttock area, Dr. Marlou Starks    Current Outpatient Medications  Medication Sig Dispense Refill   Cholecalciferol (VITAMIN D3) 250 MCG (10000 UT) TABS Take 1 tablet by mouth daily.     Misc Natural Products (COLON CLEANSE) CAPS Take 2 capsules by mouth 3 (three) times a week. Take at night     Multiple Vitamin (MULTIVITAMIN) tablet Take 1 tablet by mouth daily.     omeprazole (PRILOSEC) 40 MG capsule TAKE 1 CAPSULE BY MOUTH EVERY MORNING 30 MINUTES BEFORE BREAKFAST 90 capsule 0   traMADol (ULTRAM) 50 MG tablet Take 1 tablet (50 mg  total) by mouth every 6 (six) hours as needed. 30 tablet 0   valACYclovir (VALTREX) 500 MG tablet Take one tablet by mouth twice daily for 3 days for onset outbreak. 30 tablet 0   No current facility-administered medications for this visit.    Family History  Problem Relation Age of Onset   Colon cancer Maternal Grandfather    Cancer Maternal Grandfather        colon cancer   Depression Maternal Grandfather    Diabetes Paternal Grandmother    Alcohol abuse Father    Heart disease Father 62       MI   Hyperlipidemia Father    Hypertension Father    Depression Father    Osteoporosis Maternal Grandmother    Dementia Mother    Esophageal cancer Neg Hx    Rectal cancer Neg Hx    Stomach cancer Neg Hx     Review of Systems  Exam:   BP 130/78   Ht 5' (1.524 m)   Wt 151  lb (68.5 kg)   BMI 29.49 kg/m   Weight change: '@WEIGHTCHANGE'$ @ Height:   Height: 5' (152.4 cm)  Ht Readings from Last 3 Encounters:  10/24/20 5' (1.524 m)  10/18/20 5' (1.524 m)  03/15/20 4' 11.5" (1.511 m)    General appearance: alert, cooperative and appears stated age Head: Normocephalic, without obvious abnormality, atraumatic Neck: no adenopathy, supple, symmetrical, trachea midline and thyroid normal to inspection and palpation Lungs: clear to auscultation bilaterally Cardiovascular: regular rate and rhythm Breasts: normal appearance, no masses or tenderness Abdomen: soft, non-tender; non distended,  no masses,  no organomegaly Extremities: extremities normal, atraumatic, no cyanosis or edema Skin: Skin color, texture, turgor normal. No rashes or lesions Lymph nodes: Cervical, supraclavicular, and axillary nodes normal. No abnormal inguinal nodes palpated Neurologic: Grossly normal   Pelvic: External genitalia:  no lesions              Urethra:  normal appearing urethra with no masses, tenderness or lesions              Bartholins and Skenes: normal                 Vagina: normal appearing vagina with normal color and discharge, no lesions              Cervix: no lesions               Bimanual Exam:  Uterus:  normal size, contour, position, consistency, mobility, non-tender              Adnexa: no mass, fullness, tenderness               Rectovaginal: Confirms               Anus:  normal sphincter tone, no lesions  Britt Bottom, CMA chaperoned for the exam.  1. Well woman exam Discussed breast self exam Discussed calcium and vit D intake Mammogram overdue, she will schedule She will reach out to GI, think she is overdue for a colonoscopy  2. Perimenopause Discussed One abnormal cycle, she will call if her cycles are persistently abnormal. Would further evaluate  3. Herpes simplex infection of genitourinary system - valACYclovir (VALTREX) 500 MG tablet; Take one  tablet by mouth twice daily for 3 days for onset outbreak.  Dispense: 30 tablet; Refill: 1

## 2020-11-01 ENCOUNTER — Telehealth: Payer: Self-pay | Admitting: Physical Medicine and Rehabilitation

## 2020-11-01 ENCOUNTER — Telehealth: Payer: Self-pay

## 2020-11-01 NOTE — Telephone Encounter (Signed)
Scheduled

## 2020-11-01 NOTE — Telephone Encounter (Signed)
Pt is returning Courtneys missed call to set an appt for an inj.   [phone]

## 2020-11-01 NOTE — Telephone Encounter (Signed)
Pt called back to speak with courtney to make an appt with Dr. Ernestina Patches.

## 2020-11-02 ENCOUNTER — Other Ambulatory Visit (HOSPITAL_BASED_OUTPATIENT_CLINIC_OR_DEPARTMENT_OTHER): Payer: Self-pay | Admitting: Obstetrics and Gynecology

## 2020-11-02 ENCOUNTER — Encounter: Payer: Self-pay | Admitting: Orthopaedic Surgery

## 2020-11-02 DIAGNOSIS — M4802 Spinal stenosis, cervical region: Secondary | ICD-10-CM

## 2020-11-02 DIAGNOSIS — Z1231 Encounter for screening mammogram for malignant neoplasm of breast: Secondary | ICD-10-CM

## 2020-11-02 DIAGNOSIS — M542 Cervicalgia: Secondary | ICD-10-CM

## 2020-11-07 ENCOUNTER — Other Ambulatory Visit: Payer: Self-pay | Admitting: Obstetrics and Gynecology

## 2020-11-07 ENCOUNTER — Ambulatory Visit (HOSPITAL_BASED_OUTPATIENT_CLINIC_OR_DEPARTMENT_OTHER)
Admission: RE | Admit: 2020-11-07 | Discharge: 2020-11-07 | Disposition: A | Discharge status: Home / Self Care | Payer: 59 | Source: Ambulatory Visit | Attending: Obstetrics and Gynecology | Admitting: Obstetrics and Gynecology

## 2020-11-07 ENCOUNTER — Other Ambulatory Visit: Payer: Self-pay

## 2020-11-07 DIAGNOSIS — A6000 Herpesviral infection of urogenital system, unspecified: Secondary | ICD-10-CM

## 2020-11-07 DIAGNOSIS — Z1231 Encounter for screening mammogram for malignant neoplasm of breast: Secondary | ICD-10-CM | POA: Insufficient documentation

## 2020-11-20 ENCOUNTER — Ambulatory Visit: Payer: 59 | Admitting: Physical Medicine and Rehabilitation

## 2020-11-22 ENCOUNTER — Ambulatory Visit (INDEPENDENT_AMBULATORY_CARE_PROVIDER_SITE_OTHER): Payer: 59 | Admitting: Physical Medicine and Rehabilitation

## 2020-11-22 ENCOUNTER — Encounter: Payer: Self-pay | Admitting: Physical Medicine and Rehabilitation

## 2020-11-22 DIAGNOSIS — R202 Paresthesia of skin: Secondary | ICD-10-CM | POA: Diagnosis not present

## 2020-11-22 DIAGNOSIS — M542 Cervicalgia: Secondary | ICD-10-CM

## 2020-11-22 NOTE — Progress Notes (Signed)
Sarah Barry - 50 y.o. female MRN [id number]  Date of birth: 19-Jun-1970  Office Visit Note: Visit Date: 11/22/2020 PCP: Vernie Shanks, MD Referred by: Vernie Shanks, MD  Subjective: Chief Complaint  Patient presents with   Neck - Pain   HPI:  Sarah Barry is a 50 y.o. female who comes in today at the request of Dr. Rodell Perna for electrodiagnostic study of the Bilateral upper extremities.  Patient is Right hand dominant.  Her biggest complaint is chronic severe worsening bilateral neck pain.  This is mainly at the base or lower part of the cervical spine referring into the upper shoulders.  Not much into the arms in general.  History of off-and-on paresthesias into the hands.  She feels like some of the hand complaints were better after chiropractic care.  She has had all manner of other conservative care without much relief.  She has not had focused physical therapy or dry needling or manual treatment.  She has had acupuncture and massage however.  MRI is reviewed with findings of significant foraminal stenosis and mild to moderate canal stenosis at C6-7.  ROS Otherwise per HPI.  Assessment & Plan: Visit Diagnoses:    ICD-10-CM   1. Paresthesia of skin  R20.2 NCV with EMG (electromyography)    2. Cervicalgia  M54.2       Plan: mpression: The above electrodiagnostic study is ABNORMAL and reveals evidence of a moderate to severe bilateral R>Lmedian nerve entrapment at the wrists (carpal tunnel syndrome) affecting sensory and motor components.  She has had some history of off-and-on numbness in the hands.  Clearly this would not explain the pain at the base of her neck.   There is no significant electrodiagnostic evidence of any other focal nerve entrapment, brachial plexopathy or cervical radiculopathy.  As you know, this particular electrodiagnostic study cannot rule out chemical radiculitis or sensory only radiculopathy.  Recommendations: 1.  Follow-up with referring  physician. 2.  Continue current management of symptoms.  She does have myofascial pain with trigger points in the levator scapula and trapezius at the base of the cervical spine.  She may do well to try physical therapy with dry needling.  Meds & Orders: No orders of the defined types were placed in this encounter.   Orders Placed This Encounter  Procedures   NCV with EMG (electromyography)    Follow-up: Return in about 2 weeks (around 12/06/2020) for Rodell Perna, MD.   Procedures: No procedures performed  EMG & NCV Findings: Evaluation of the left median motor and the right median motor nerves showed prolonged distal onset latency (L4.4, R4.4 ms), reduced amplitude (L4.9, R2.3 mV), and decreased conduction velocity (Elbow-Wrist, L41, R42 m/s).  The left median (across palm) sensory nerve showed prolonged distal peak latency (Wrist, 4.1 ms) and prolonged distal peak latency (Palm, 4.3 ms).  The right median (across palm) sensory nerve showed prolonged distal peak latency (Wrist, 3.8 ms).  All remaining nerves (as indicated in the following tables) were within normal limits.  All left vs. right side differences were within normal limits.    All examined muscles (as indicated in the following table) showed no evidence of electrical instability.    Impression: The above electrodiagnostic study is ABNORMAL and reveals evidence of a moderate to severe bilateral R>Lmedian nerve entrapment at the wrists (carpal tunnel syndrome) affecting sensory and motor components.  She has had some history of off-and-on numbness in the hands.  Clearly this would not  explain the pain at the base of her neck.   There is no significant electrodiagnostic evidence of any other focal nerve entrapment, brachial plexopathy or cervical radiculopathy.  As you know, this particular electrodiagnostic study cannot rule out chemical radiculitis or sensory only radiculopathy.  Recommendations: 1.  Follow-up with referring  physician. 2.  Continue current management of symptoms.  She does have myofascial pain with trigger points in the levator scapula and trapezius at the base of the cervical spine.  She may do well to try physical therapy with dry needling.  ___________________________ Laurence Spates [facility] Board Certified, American Board of Physical Medicine and Rehabilitation    Nerve Conduction Studies Anti Sensory Summary Table   Stim Site NR Peak (ms) Norm Peak (ms) P-T Amp (V) Norm P-T Amp Site1 Site2 Delta-P (ms) Dist (cm) Vel (m/s) Norm Vel (m/s)  Left Median Acr Palm Anti Sensory (2nd Digit)  31.3C  Wrist    *4.1 <3.6 18.0 >10 Wrist Palm 0.2 0.0    Palm    *4.3 <2.0 11.8         Right Median Acr Palm Anti Sensory (2nd Digit)  30.8C  Wrist    *3.8 <3.6 32.0 >10 Wrist Palm 2.0 0.0    Palm    1.8 <2.0 15.0         Left Radial Anti Sensory (Base 1st Digit)  31.3C  Wrist    2.0 <3.1 52.5  Wrist Base 1st Digit 2.0 0.0    Right Radial Anti Sensory (Base 1st Digit)  31C  Wrist    2.1 <3.1 16.7  Wrist Base 1st Digit 2.1 0.0    Left Ulnar Anti Sensory (5th Digit)  31.5C  Wrist    3.0 <3.7 25.7 >15.0 Wrist 5th Digit 3.0 14.0 47 >38  Right Ulnar Anti Sensory (5th Digit)  31.1C  Wrist    3.0 <3.7 25.8 >15.0 Wrist 5th Digit 3.0 14.0 47 >38   Motor Summary Table   Stim Site NR Onset (ms) Norm Onset (ms) O-P Amp (mV) Norm O-P Amp Site1 Site2 Delta-0 (ms) Dist (cm) Vel (m/s) Norm Vel (m/s)  Left Median Motor (Abd Poll Brev)  31.5C    Martin-Gruber  Wrist    *4.4 <4.2 *4.9 >5 Elbow Wrist 4.4 18.0 *41 >50  Elbow    8.8  5.1         Right Median Motor (Abd Poll Brev)  31C    Martin-Gruber  Wrist    *4.4 <4.2 *2.3 >5 Elbow Wrist 4.3 18.0 *42 >50  Elbow    8.7  4.0         Left Ulnar Motor (Abd Dig Min)  31.5C  Wrist    2.5 <4.2 9.7 >3 B Elbow Wrist 2.6 17.0 65 >53  B Elbow    5.1  10.1  A Elbow B Elbow 1.3 10.0 77 >53  A Elbow    6.4  10.3         Right Ulnar Motor (Abd Dig Min)  31.2C  Wrist     2.7 <4.2 10.2 >3 B Elbow Wrist 2.4 16.0 67 >53  B Elbow    5.1  9.9  A Elbow B Elbow 1.1 10.0 91 >53  A Elbow    6.2  10.1          EMG   Side Muscle Nerve Root Ins Act Fibs Psw Amp Dur Poly Recrt Int Fraser Din Comment  Right Abd Poll Brev Median C8-T1 Nml Nml Nml Nml  Nml 0 Nml Nml   Right 1stDorInt Ulnar C8-T1 Nml Nml Nml Nml Nml 0 Nml Nml   Right PronatorTeres Median C6-7 Nml Nml Nml Nml Nml 0 Nml Nml   Right Biceps Musculocut C5-6 Nml Nml Nml Nml Nml 0 Nml Nml   Right Deltoid Axillary C5-6 Nml Nml Nml Nml Nml 0 Nml Nml   Left Abd Poll Brev Median C8-T1 Nml Nml Nml Nml Nml 0 Nml Nml   Left 1stDorInt Ulnar C8-T1 Nml Nml Nml Nml Nml 0 Nml Nml   Left PronatorTeres Median C6-7 Nml Nml Nml Nml Nml 0 Nml Nml   Left Biceps Musculocut C5-6 Nml Nml Nml Nml Nml 0 Nml Nml   Left Deltoid Axillary C5-6 Nml Nml Nml Nml Nml 0 Nml Nml     Nerve Conduction Studies Anti Sensory Left/Right Comparison   Stim Site L Lat (ms) R Lat (ms) L-R Lat (ms) L Amp (V) R Amp (V) L-R Amp (%) Site1 Site2 L Vel (m/s) R Vel (m/s) L-R Vel (m/s)  Median Acr Palm Anti Sensory (2nd Digit)  31.3C  Wrist *4.1 *3.8 0.3 18.0 32.0 43.8 Wrist Palm     Palm *4.3 1.8 2.5 11.8 15.0 21.3       Radial Anti Sensory (Base 1st Digit)  31.3C  Wrist 2.0 2.1 0.1 52.5 16.7 68.2 Wrist Base 1st Digit     Ulnar Anti Sensory (5th Digit)  31.5C  Wrist 3.0 3.0 0.0 25.7 25.8 0.4 Wrist 5th Digit 47 47 0   Motor Left/Right Comparison   Stim Site L Lat (ms) R Lat (ms) L-R Lat (ms) L Amp (mV) R Amp (mV) L-R Amp (%) Site1 Site2 L Vel (m/s) R Vel (m/s) L-R Vel (m/s)  Median Motor (Abd Poll Brev)  31.5C    Martin-Gruber  Wrist *4.4 *4.4 0.0 *4.9 *2.3 53.1 Elbow Wrist *41 *42 1  Elbow 8.8 8.7 0.1 5.1 4.0 21.6       Ulnar Motor (Abd Dig Min)  31.5C  Wrist 2.5 2.7 0.2 9.7 10.2 4.9 B Elbow Wrist 65 67 2  B Elbow 5.1 5.1 0.0 10.1 9.9 2.0 A Elbow B Elbow 77 91 14  A Elbow 6.4 6.2 0.2 10.3 10.1 1.9          Waveforms:                      Clinical History: MRI CERVICAL SPINE WITHOUT CONTRAST   TECHNIQUE: Multiplanar, multisequence MR imaging of the cervical spine was performed. No intravenous contrast was administered.   COMPARISON:  Cervical radiographs March 25, 2020.   FINDINGS: Alignment: Slight anterolisthesis of C3 on C4. Otherwise, no substantial sagittal subluxation.   Vertebrae: No evidence of acute fracture, discitis/osteomyelitis, or suspicious bone lesion.   Cord: Normal cord signal.   Posterior Fossa, vertebral arteries, paraspinal tissues: Visualized vertebral artery flow voids are maintained. Unremarkable visualized posterior fossa.   Disc levels:   C2-C3: No significant disc protrusion, foraminal stenosis, or canal stenosis.   C3-C4: Posterior disc osteophyte complex with left greater than right facet and uncovertebral hypertrophy. Resulting mild left foraminal stenosis and mild canal stenosis.   C4-C5: Posterior disc osteophyte complex with mild bilateral facet and uncovertebral hypertrophy. No significant canal or foraminal stenosis.   C5-C6: Posterior disc osteophyte complex with moderate right facet and uncovertebral hypertrophy. Resulting moderate to severe right foraminal stenosis. Mild canal stenosis.   C6-C7: Disc height loss. Posterior disc osteophyte complex, eccentric to the left. Left greater  than right facet and uncovertebral hypertrophy. Resulting moderate to severe left and mild-to-moderate right foraminal stenosis. Mild to moderate canal stenosis.   C7-T1: No significant disc protrusion, foraminal stenosis, or canal stenosis.   IMPRESSION: 1. 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 Mild to moderate foraminal stenosis on the right at C6-C7 and mild foraminal stenosis on the left at C3-C4. 2. Mild to moderate canal stenosis at C6-C7 and mild canal stenosis at C5-C6.     Electronically Signed   By: Margaretha Sheffield  MD   On: 09/08/2020 18:59     Objective:  VS:  HT:    WT:   BMI:     BP:   HR: bpm  TEMP: ( )  RESP:  Physical Exam Musculoskeletal:        General: No swelling, tenderness or deformity.     Comments: Inspection reveals no atrophy of the bilateral APB or FDI or hand intrinsics. There is no swelling, color changes, allodynia or dystrophic changes. There is 5 out of 5 strength in the bilateral wrist extension, finger abduction and long finger flexion. There is intact sensation to light touch in all dermatomal and peripheral nerve distributions. There is a positive Phalen's test bilaterally. There is a negative Hoffmann's test bilaterally.  Trigger points palpated in the bilateral levator scapula and trapezius which do reproduce some of her pain.  Skin:    General: Skin is warm and dry.     Findings: No erythema or rash.  Neurological:     General: No focal deficit present.     Mental Status: She is alert and oriented to person, place, and time.     Motor: No weakness or abnormal muscle tone.     Coordination: Coordination normal.  Psychiatric:        Mood and Affect: Mood normal.        Behavior: Behavior normal.     Imaging: No results found.

## 2020-11-22 NOTE — Progress Notes (Signed)
Severe neck pain equal on both sides.  No lotion per patient

## 2020-11-23 NOTE — Procedures (Signed)
EMG & NCV Findings: Evaluation of the left median motor and the right median motor nerves showed prolonged distal onset latency (L4.4, R4.4 ms), reduced amplitude (L4.9, R2.3 mV), and decreased conduction velocity (Elbow-Wrist, L41, R42 m/s).  The left median (across palm) sensory nerve showed prolonged distal peak latency (Wrist, 4.1 ms) and prolonged distal peak latency (Palm, 4.3 ms).  The right median (across palm) sensory nerve showed prolonged distal peak latency (Wrist, 3.8 ms).  All remaining nerves (as indicated in the following tables) were within normal limits.  All left vs. right side differences were within normal limits.    All examined muscles (as indicated in the following table) showed no evidence of electrical instability.    Impression: The above electrodiagnostic study is ABNORMAL and reveals evidence of a moderate to severe bilateral R>Lmedian nerve entrapment at the wrists (carpal tunnel syndrome) affecting sensory and motor components.  She has had some history of off-and-on numbness in the hands.  Clearly this would not explain the pain at the base of her neck.   There is no significant electrodiagnostic evidence of any other focal nerve entrapment, brachial plexopathy or cervical radiculopathy.  As you know, this particular electrodiagnostic study cannot rule out chemical radiculitis or sensory only radiculopathy.  Recommendations: 1.  Follow-up with referring physician. 2.  Continue current management of symptoms.  She does have myofascial pain with trigger points in the levator scapula and trapezius at the base of the cervical spine.  She may do well to try physical therapy with dry needling.  ___________________________ Laurence Spates [facility] Board Certified, American Board of Physical Medicine and Rehabilitation    Nerve Conduction Studies Anti Sensory Summary Table   Stim Site NR Peak (ms) Norm Peak (ms) P-T Amp (V) Norm P-T Amp Site1 Site2 Delta-P (ms) Dist (cm)  Vel (m/s) Norm Vel (m/s)  Left Median Acr Palm Anti Sensory (2nd Digit)  31.3C  Wrist    *4.1 <3.6 18.0 >10 Wrist Palm 0.2 0.0    Palm    *4.3 <2.0 11.8         Right Median Acr Palm Anti Sensory (2nd Digit)  30.8C  Wrist    *3.8 <3.6 32.0 >10 Wrist Palm 2.0 0.0    Palm    1.8 <2.0 15.0         Left Radial Anti Sensory (Base 1st Digit)  31.3C  Wrist    2.0 <3.1 52.5  Wrist Base 1st Digit 2.0 0.0    Right Radial Anti Sensory (Base 1st Digit)  31C  Wrist    2.1 <3.1 16.7  Wrist Base 1st Digit 2.1 0.0    Left Ulnar Anti Sensory (5th Digit)  31.5C  Wrist    3.0 <3.7 25.7 >15.0 Wrist 5th Digit 3.0 14.0 47 >38  Right Ulnar Anti Sensory (5th Digit)  31.1C  Wrist    3.0 <3.7 25.8 >15.0 Wrist 5th Digit 3.0 14.0 47 >38   Motor Summary Table   Stim Site NR Onset (ms) Norm Onset (ms) O-P Amp (mV) Norm O-P Amp Site1 Site2 Delta-0 (ms) Dist (cm) Vel (m/s) Norm Vel (m/s)  Left Median Motor (Abd Poll Brev)  31.5C    Martin-Gruber  Wrist    *4.4 <4.2 *4.9 >5 Elbow Wrist 4.4 18.0 *41 >50  Elbow    8.8  5.1         Right Median Motor (Abd Poll Brev)  31C    Martin-Gruber  Wrist    *4.4 <4.2 *2.3 >5 Elbow  Wrist 4.3 18.0 *42 >50  Elbow    8.7  4.0         Left Ulnar Motor (Abd Dig Min)  31.5C  Wrist    2.5 <4.2 9.7 >3 B Elbow Wrist 2.6 17.0 65 >53  B Elbow    5.1  10.1  A Elbow B Elbow 1.3 10.0 77 >53  A Elbow    6.4  10.3         Right Ulnar Motor (Abd Dig Min)  31.2C  Wrist    2.7 <4.2 10.2 >3 B Elbow Wrist 2.4 16.0 67 >53  B Elbow    5.1  9.9  A Elbow B Elbow 1.1 10.0 91 >53  A Elbow    6.2  10.1          EMG   Side Muscle Nerve Root Ins Act Fibs Psw Amp Dur Poly Recrt Int Fraser Din Comment  Right Abd Poll Brev Median C8-T1 Nml Nml Nml Nml Nml 0 Nml Nml   Right 1stDorInt Ulnar C8-T1 Nml Nml Nml Nml Nml 0 Nml Nml   Right PronatorTeres Median C6-7 Nml Nml Nml Nml Nml 0 Nml Nml   Right Biceps Musculocut C5-6 Nml Nml Nml Nml Nml 0 Nml Nml   Right Deltoid Axillary C5-6 Nml Nml Nml Nml Nml 0 Nml  Nml   Left Abd Poll Brev Median C8-T1 Nml Nml Nml Nml Nml 0 Nml Nml   Left 1stDorInt Ulnar C8-T1 Nml Nml Nml Nml Nml 0 Nml Nml   Left PronatorTeres Median C6-7 Nml Nml Nml Nml Nml 0 Nml Nml   Left Biceps Musculocut C5-6 Nml Nml Nml Nml Nml 0 Nml Nml   Left Deltoid Axillary C5-6 Nml Nml Nml Nml Nml 0 Nml Nml     Nerve Conduction Studies Anti Sensory Left/Right Comparison   Stim Site L Lat (ms) R Lat (ms) L-R Lat (ms) L Amp (V) R Amp (V) L-R Amp (%) Site1 Site2 L Vel (m/s) R Vel (m/s) L-R Vel (m/s)  Median Acr Palm Anti Sensory (2nd Digit)  31.3C  Wrist *4.1 *3.8 0.3 18.0 32.0 43.8 Wrist Palm     Palm *4.3 1.8 2.5 11.8 15.0 21.3       Radial Anti Sensory (Base 1st Digit)  31.3C  Wrist 2.0 2.1 0.1 52.5 16.7 68.2 Wrist Base 1st Digit     Ulnar Anti Sensory (5th Digit)  31.5C  Wrist 3.0 3.0 0.0 25.7 25.8 0.4 Wrist 5th Digit 47 47 0   Motor Left/Right Comparison   Stim Site L Lat (ms) R Lat (ms) L-R Lat (ms) L Amp (mV) R Amp (mV) L-R Amp (%) Site1 Site2 L Vel (m/s) R Vel (m/s) L-R Vel (m/s)  Median Motor (Abd Poll Brev)  31.5C    Martin-Gruber  Wrist *4.4 *4.4 0.0 *4.9 *2.3 53.1 Elbow Wrist *41 *42 1  Elbow 8.8 8.7 0.1 5.1 4.0 21.6       Ulnar Motor (Abd Dig Min)  31.5C  Wrist 2.5 2.7 0.2 9.7 10.2 4.9 B Elbow Wrist 65 67 2  B Elbow 5.1 5.1 0.0 10.1 9.9 2.0 A Elbow B Elbow 77 91 14  A Elbow 6.4 6.2 0.2 10.3 10.1 1.9          Waveforms:

## 2020-12-31 ENCOUNTER — Encounter: Payer: Self-pay | Admitting: Physical Medicine and Rehabilitation

## 2021-01-02 ENCOUNTER — Ambulatory Visit: Payer: 59 | Admitting: Physical Medicine and Rehabilitation

## 2021-01-03 ENCOUNTER — Telehealth: Payer: Self-pay | Admitting: Physical Medicine and Rehabilitation

## 2021-01-03 DIAGNOSIS — M542 Cervicalgia: Secondary | ICD-10-CM

## 2021-01-03 DIAGNOSIS — M7918 Myalgia, other site: Secondary | ICD-10-CM

## 2021-01-03 NOTE — Telephone Encounter (Signed)
Referral placed.

## 2021-01-03 NOTE — Telephone Encounter (Signed)
Pt called requesting a call back. Pt states Dr. Ernestina Patches suggested dry needling and need an appt. Please call pt about this matter at [phone].

## 2021-01-04 ENCOUNTER — Other Ambulatory Visit: Payer: Self-pay | Admitting: Internal Medicine

## 2021-01-15 ENCOUNTER — Ambulatory Visit (INDEPENDENT_AMBULATORY_CARE_PROVIDER_SITE_OTHER): Payer: 59 | Admitting: Physical Therapy

## 2021-01-15 ENCOUNTER — Other Ambulatory Visit: Payer: Self-pay

## 2021-01-15 DIAGNOSIS — M542 Cervicalgia: Secondary | ICD-10-CM | POA: Diagnosis not present

## 2021-01-15 DIAGNOSIS — M6281 Muscle weakness (generalized): Secondary | ICD-10-CM

## 2021-01-15 DIAGNOSIS — R252 Cramp and spasm: Secondary | ICD-10-CM

## 2021-01-15 NOTE — Therapy (Signed)
[facility] Physical Therapy [address] [place], [place], [address] Phone: [phone]   Fax:  [phone]  Physical Therapy Evaluation  Patient Details  Name: Sarah Barry MRN: [id number] Date of Birth: 1970/04/04 Referring Provider (PT): Magnus Sinning MD   Encounter Date: 01/15/2021   PT End of Session - 01/15/21 0755     Visit Number 1    Date for PT Re-Evaluation 02/26/21    Authorization Type [employer]    PT Start Time 0800    PT Stop Time 0841    PT Time Calculation (min) 41 min    Activity Tolerance Patient tolerated treatment well    Behavior During Therapy [facility] for tasks assessed/performed             Past Medical History:  Diagnosis Date   Abnormal Pap smear of cervix    Allergic rhinitis    Anemia    Bipolar disorder ([place])    Cancer ([place])    skin ca - buttocks   Chronic headache    during cycle   Colon polyp 04/2012   tubular adenoma   Depression    Dysmenorrhea 05/19/2013   Fatigue    Fibroid    Folliculitis    GERD (gastroesophageal reflux disease)    HSV infection    Positive H. pylori test    Small intestinal bacterial overgrowth (SIBO) 11/30/2019   STD (sexually transmitted disease)    genital warts   Thrombocytosis    Tongue disorder     Past Surgical History:  Procedure Laterality Date   CHOLECYSTECTOMY  2000   COLONOSCOPY  04/2012   tubular adenoma   COLPOSCOPY  2014   ESOPHAGOGASTRODUODENOSCOPY     HYSTEROSCOPY WITH D & C N/A 12/01/2013   Procedure: INSERTION OF SEPRAFILM;  Surgeon: Governor Specking, MD;  Location: [place];  Service: Gynecology;  Laterality: N/A;   MYOMECTOMY N/A 12/01/2013   Procedure: LAPAROSCOPY/GELPORT ASSISTED MYOMECTOMY/ADENOMYOMECTOMY;  Surgeon: Governor Specking, MD;  Location: [place];  Service: Gynecology;  Laterality: N/A;   SKIN CANCER EXCISION     buttock area, Dr. Marlou Starks    There were no vitals filed for this visit.     Subjective Assessment - 01/15/21 0801     Subjective My neck feels like it's been hit by a baseball constantly. Pain started in neck and now is spreading down into upper back. She had some relief from chiropractor (8 wks x 3/wk), but it's still there. 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 Pain started about a year ago and the past six months has worsened.    Diagnostic tests MRI: sever stenosis: Lt C6/7, Rt C5/6; mild to mod Lt C3/4 and Rt C6/7; canal stenosis: mod C6/7, mild C5/6; NCV negative    Patient Stated Goals get rid of pain.    Currently in Pain? Yes    Pain Score 8     Pain Location Neck    Pain Orientation Right;Left    Pain Descriptors / Indicators Aching;Spasm    Pain Type Chronic pain    Pain Radiating Towards into shoulders/upper back    Pain Onset More than a month ago    Pain Frequency Constant    Aggravating Factors  no    Pain Relieving Factors massage gun; ibuprofen                [facility] PT Assessment - 01/15/21 0001  Assessment   Medical Diagnosis cervicalgia, myofascial pain    Referring Provider (PT) Magnus Sinning MD    Onset Date/Surgical Date 07/15/20    Hand Dominance Left    Prior Therapy no      Precautions   Precautions None      Restrictions   Weight Bearing Restrictions No      Balance Screen   Has the patient fallen in the past 6 months No    Has the patient had a decrease in activity level because of a fear of falling?  No    Is the patient reluctant to leave their home because of a fear of falling?  No      Home Ecologist residence      Prior Function   Level of Independence Independent    Vocation Full time employment    Vocation Requirements cutting nails      Posture/Postural Control   Posture/Postural Control No significant limitations      ROM / Strength   AROM / PROM / Strength AROM;Strength;PROM      AROM   Overall AROM  Comments LSB hurts on left bil    AROM Assessment Site Cervical    Cervical Flexion 27    Cervical Extension 43    Cervical - Right Side Bend 14    Cervical - Left Side Bend 28    Cervical - Right Rotation 67    Cervical - Left Rotation 60      PROM   Overall PROM Comments full PROM cervcical      Strength   Overall Strength Comments flex, left SB 4/5, else 5/5      Palpation   Spinal mobility decreased mid thoracic CPA; let cervcial PA decreased vs Rt no pain reported    Palpation comment marked tightness bil UT with TPs, tightness in cervical and suboccipital muscles      Special Tests   Other special tests ULTT: ulnar neg; med neg; radial + left (mild)                        Objective measurements completed on examination: See above findings.       [facility] PT Treatment/Exercise - 01/15/21 0001       Manual Therapy   Manual therapy comments skilled palpation and monitoring of soft tissues during DN              Trigger Point Dry Needling - 01/15/21 0001     Consent Given? Yes    Education Handout Provided Yes    Muscles Treated Head and Neck Upper trapezius;Suboccipitals;Cervical multifidi    Dry Needling Comments bil    Upper Trapezius Response Twitch reponse elicited;Palpable increased muscle length    Suboccipitals Response Palpable increased muscle length    Cervical multifidi Response Palpable increased muscle length                   PT Education - 01/15/21 1140     Education Details HEP; DN education and aftercare    Person(s) Educated Patient    Methods Explanation;Demonstration;Verbal cues;Handout    Comprehension Verbalized understanding;Returned demonstration                 PT Long Term Goals - 01/15/21 1151       PT LONG TERM GOAL #1   Title Pt to demo full functional neck ROM with 75% less pain with ADLS.  Time 6    Period Weeks    Status New    Target Date 02/26/21      PT LONG TERM GOAL #2    Title Pt able to sleep without UE N/T.    Time 6    Period Weeks    Status New      PT LONG TERM GOAL #3   Title Ind with HEP to maintain gains made in therapy.    Time 6    Period Weeks    Status New                    Plan - 01/15/21 1140     Clinical Impression Statement Patient presents with c/o of constant neck pain beginning about one year ago and worsening in the past 6 months. The pain affects her sleeping and normal ADLS. She works as a Scientist, forensic for homebound elderly. She has full PROM, but is limited actively in all planes. She has marked tightness of cervcial musculature. Cervical mobility is decreased left >right as is mid thoracic bil. She had mild +ULTT of the radial nerve on the left and reports intermittent N/T in bil hands with sleeping. Overall good posture, but some weakness in cervical stabilizers especially with resisted flex and Left SB. Initial trial of DN to bil UT, cervical multifidi and suboccipitals per MD order with excellent response. She will benefit from continued maual therapy and DN to the cspine as well as therex to address above deficits.    Personal Factors and Comorbidities Time since onset of injury/illness/exacerbation    Stability/Clinical Decision Making Stable/Uncomplicated    Clinical Decision Making Low    Rehab Potential Excellent    PT Frequency 2x / week    PT Duration 6 weeks    PT Treatment/Interventions ADLs/Self Care Home Management;Cryotherapy;Electrical Stimulation;Moist Heat;Traction;Therapeutic activities;Therapeutic exercise;Neuromuscular re-education;Manual techniques;Patient/family education;Dry needling;Spinal Manipulations    PT Next Visit Plan assess DN and continue as indicated; cervcial stabilization; cervical/thoracic mobility             Patient will benefit from skilled therapeutic intervention in order to improve the following deficits and impairments:  Decreased range of motion, Pain, Increased muscle  spasms, Decreased strength, Impaired flexibility, Hypomobility  Visit Diagnosis: Cervicalgia  Cramp and spasm  Muscle weakness (generalized)     Problem List Patient Active Problem List   Diagnosis Date Noted   Spinal stenosis of cervical region 10/20/2020   Foraminal stenosis of cervical region 10/20/2020   Small intestinal bacterial overgrowth (SIBO) 11/30/2019   Bloating 10/05/2019   Chronic constipation 10/05/2019   Brain fog 10/05/2019   Gastritis 12/27/2014   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 [place], [place], [address] Phone: [phone]   Fax:  [phone]  Name: Sarah Barry MRN: [id number] Date of Birth: 12-30-1970

## 2021-01-15 NOTE — Patient Instructions (Signed)
Access Code: [email or web address] URL: [email or web address] Date: 01/15/2021 Prepared by: Almyra Free  Exercises Seated Cervical Retraction - 1 x daily - 7 x weekly - 3 sets - 10 reps Seated Levator Scapulae Stretch - 1 x daily - 7 x weekly - 3 sets - 10 reps Seated Cervical Sidebending Stretch - 1 x daily - 7 x weekly - 3 sets - 10 reps  Patient Education Trigger Point Dry Needling

## 2021-01-17 ENCOUNTER — Encounter: Payer: Self-pay | Admitting: Internal Medicine

## 2021-01-18 ENCOUNTER — Other Ambulatory Visit: Payer: Self-pay

## 2021-01-18 DIAGNOSIS — K219 Gastro-esophageal reflux disease without esophagitis: Secondary | ICD-10-CM

## 2021-01-18 MED ORDER — PANTOPRAZOLE SODIUM 40 MG PO TBEC
40.0000 mg | DELAYED_RELEASE_TABLET | Freq: Every day | ORAL | 0 refills | Status: DC
Start: 2021-01-18 — End: 2021-02-01

## 2021-01-18 NOTE — Telephone Encounter (Signed)
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 Pt states that recently she has started to have an increase in heartburn over the last several months. Pt states that on some days that she is taking the medication in the morning and evenings. Pt states that after taking the medication twice a day sometimes the heartburn is still there. Pt states that she does not feel that the medication is working anymore. Please advise.

## 2021-01-18 NOTE — Telephone Encounter (Signed)
Pt notified of Dr. Carlean Purl recommendations.  Prescription sent to pharmacy: Pt made aware Pt scheduled for a follow Up appointment on 02/28/2021 @ 8:30 with Dr. Carlean Purl  Pt made aware Pt verbalized understanding with all questions answered

## 2021-01-23 ENCOUNTER — Other Ambulatory Visit: Payer: Self-pay

## 2021-01-23 ENCOUNTER — Encounter: Payer: Self-pay | Admitting: Rehabilitative and Restorative Service Providers"

## 2021-01-23 ENCOUNTER — Ambulatory Visit (INDEPENDENT_AMBULATORY_CARE_PROVIDER_SITE_OTHER): Payer: 59 | Admitting: Rehabilitative and Restorative Service Providers"

## 2021-01-23 DIAGNOSIS — R252 Cramp and spasm: Secondary | ICD-10-CM | POA: Diagnosis not present

## 2021-01-23 DIAGNOSIS — M6281 Muscle weakness (generalized): Secondary | ICD-10-CM | POA: Diagnosis not present

## 2021-01-23 DIAGNOSIS — M542 Cervicalgia: Secondary | ICD-10-CM | POA: Diagnosis not present

## 2021-01-23 NOTE — Therapy (Addendum)
[facility] Physical Therapy [address] [place], [place], [address] Phone: [phone]   Fax:  [phone]  Physical Therapy Treatment /Discharge   Patient Details  Name: Sarah Barry MRN: [id number] Date of Birth: 01/12/1971 Referring Provider (PT): Magnus Sinning MD   Encounter Date: 01/23/2021   PT End of Session - 01/23/21 0843     Visit Number 2    Date for PT Re-Evaluation 02/26/21    Authorization Type [employer]    PT Start Time 0800    PT Stop Time [id number]    PT Time Calculation (min) 38 min    Activity Tolerance Patient tolerated treatment well    Behavior During Therapy [facility] for tasks assessed/performed             Past Medical History:  Diagnosis Date   Abnormal Pap smear of cervix    Allergic rhinitis    Anemia    Bipolar disorder ([place])    Cancer ([place])    skin ca - buttocks   Chronic headache    during cycle   Colon polyp 04/2012   tubular adenoma   Depression    Dysmenorrhea 05/19/2013   Fatigue    Fibroid    Folliculitis    GERD (gastroesophageal reflux disease)    HSV infection    Positive H. pylori test    Small intestinal bacterial overgrowth (SIBO) 11/30/2019   STD (sexually transmitted disease)    genital warts   Thrombocytosis    Tongue disorder     Past Surgical History:  Procedure Laterality Date   CHOLECYSTECTOMY  2000   COLONOSCOPY  04/2012   tubular adenoma   COLPOSCOPY  2014   ESOPHAGOGASTRODUODENOSCOPY     HYSTEROSCOPY WITH D & C N/A 12/01/2013   Procedure: INSERTION OF SEPRAFILM;  Surgeon: Governor Specking, MD;  Location: [place];  Service: Gynecology;  Laterality: N/A;   MYOMECTOMY N/A 12/01/2013   Procedure: LAPAROSCOPY/GELPORT ASSISTED MYOMECTOMY/ADENOMYOMECTOMY;  Surgeon: Governor Specking, MD;  Location: [place];  Service: Gynecology;  Laterality: N/A;   SKIN CANCER EXCISION     buttock area, Dr. Marlou Starks    There were no vitals filed for this visit.    Subjective Assessment - 01/23/21 0805     Subjective Pt. indicated she was sore for several days and ultimately felt similar pain upon arrival today.  Pt. indicated maybe a short term relief in time since visit.  Pain noted about 95% of the time.    Diagnostic tests MRI: sever stenosis: Lt C6/7, Rt C5/6; mild to mod Lt C3/4 and Rt C6/7; canal stenosis: mod C6/7, mild C5/6; NCV negative    Patient Stated Goals get rid of pain.    Currently in Pain? Yes    Pain Score 7     Pain Location Neck    Pain Orientation Left;Right    Pain Descriptors / Indicators Aching;Spasm;Burning    Pain Type Chronic pain    Pain Onset More than a month ago    Pain Frequency Constant    Aggravating Factors  constant symptoms    Pain Relieving Factors short term maybe after last visit                [facility] PT Assessment - 01/23/21 0001       Assessment   Medical Diagnosis cervicalgia, myofascial pain    Referring Provider (PT) Magnus Sinning MD    Onset Date/Surgical Date 07/15/20      AROM  Cervical - Right Rotation 70    Cervical - Left Rotation 74                           [facility] Adult PT Treatment/Exercise - 01/23/21 0001       Exercises   Exercises Neck;Other Exercises    Other Exercises  Review of existing HEP      Neck Exercises: Theraband   Shoulder Extension Green;20 reps    Rows 20 reps;Green      Neck Exercises: Seated   Other Seated Exercise seated scapular retraction 5 sec hold x 10, self upper trap stretch Lt 15 sec x 5      Manual Therapy   Manual therapy comments skilled palpation and monitoring of soft tissues during DN.  Percussive device to Lt upper trap region( cues for home use)              Trigger Point Dry Needling - 01/23/21 0001     Consent Given? Yes    Education Handout Provided Previously provided    Muscles Treated Head and Neck Upper trapezius   Lt   Upper Trapezius Response Twitch reponse elicited                         PT Long Term Goals - 01/15/21 1151       PT LONG TERM GOAL #1   Title Pt to demo full functional neck ROM with 75% less pain with ADLS.    Time 6    Period Weeks    Status New    Target Date 02/26/21      PT LONG TERM GOAL #2   Title Pt able to sleep without UE N/T.    Time 6    Period Weeks    Status New      PT LONG TERM GOAL #3   Title Ind with HEP to maintain gains made in therapy.    Time 6    Period Weeks    Status New                   Plan - 01/23/21 0825     Clinical Impression Statement Continued presentation of increased mobility restriction c myofascial trigger point/tightness related to concordant symptoms.  Pt. tolerated intervention fair to good today in manual and dry needling.  Progression to include posterior scapular strengthening.  Continued skilled PT services indicated at this time.    Personal Factors and Comorbidities Time since onset of injury/illness/exacerbation    Stability/Clinical Decision Making Stable/Uncomplicated    Rehab Potential Excellent    PT Frequency 2x / week    PT Duration 6 weeks    PT Treatment/Interventions ADLs/Self Care Home Management;Cryotherapy;Electrical Stimulation;Moist Heat;Traction;Therapeutic activities;Therapeutic exercise;Neuromuscular re-education;Manual techniques;Patient/family education;Dry needling;Spinal Manipulations    PT Next Visit Plan DN as indicated, transition to include compression c movement techniques if desired.    PT Home Exercise Plan [email or web address]    Consulted and Agree with Plan of Care Patient             Patient will benefit from skilled therapeutic intervention in order to improve the following deficits and impairments:  Decreased range of motion, Pain, Increased muscle spasms, Decreased strength, Impaired flexibility, Hypomobility  Visit Diagnosis: Cervicalgia  Cramp and spasm  Muscle weakness (generalized)     Problem List Patient Active Problem  List   Diagnosis Date Noted   Spinal stenosis  of cervical region 10/20/2020   Foraminal stenosis of cervical region 10/20/2020   Small intestinal bacterial overgrowth (SIBO) 11/30/2019   Bloating 10/05/2019   Chronic constipation 10/05/2019   Brain fog 10/05/2019   Gastritis 12/27/2014   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   Anterior embryotoxon 05/10/2014   Excessive tear production of left lacrimal gland 05/10/2014   Endometrial polyp 07/23/2013    Class: History of   Enlarged uterus 05/19/2013   Dysmenorrhea 05/19/2013    Class: History of   Menorrhagia with regular cycle 05/19/2013    Class: History of   Iron deficiency anemia 01/24/2012   GERD (gastroesophageal reflux disease) 01/23/2012   TRIGGER FINGER, RIGHT THUMB 08/15/2009   CHOLECYSTECTOMY, HX OF 08/04/2006    Scot Jun, PT, DPT, OCS, ATC 01/23/21  8:44 AM  PHYSICAL THERAPY DISCHARGE SUMMARY  Visits from Start of Care: 2  Current functional level related to goals / functional outcomes: See note   Remaining deficits: See note   Education / Equipment: HEP   Patient agrees to discharge. Patient goals were not met. Patient is being discharged due to not returning since the last visit.  Scot Jun, PT, DPT, OCS, ATC 03/29/21  7:52 AM     [facility] Physical Therapy [address] [place], [place], [address] Phone: [phone]   Fax:  [phone]  Name: Sarah Barry MRN: [id number] Date of Birth: 29-Jan-1971

## 2021-01-23 NOTE — Patient Instructions (Signed)
Access Code: [email or web address] URL: [email or web address] Date: 01/23/2021 Prepared by: Scot Jun  Exercises Seated Cervical Retraction - 1 x daily - 7 x weekly - 3 sets - 10 reps Seated Levator Scapulae Stretch - 1 x daily - 7 x weekly - 3 sets - 10 reps Seated Cervical Sidebending Stretch - 1 x daily - 7 x weekly - 3 sets - 10 reps Standing Shoulder Row with Anchored Resistance - 1 x daily - 7 x weekly - 3 sets - 10 reps Shoulder Extension with Resistance - 2 x daily - 7 x weekly - 10 reps - 3 sets Seated Scapular Retraction - 2 x daily - 7 x weekly - 10 reps - 2 sets - 5 hold  Patient Education Trigger Point Dry Needling

## 2021-01-29 ENCOUNTER — Encounter: Payer: 59 | Admitting: Physical Therapy

## 2021-01-30 ENCOUNTER — Encounter: Payer: Self-pay | Admitting: Internal Medicine

## 2021-01-31 ENCOUNTER — Encounter: Payer: 59 | Admitting: Physical Therapy

## 2021-02-01 ENCOUNTER — Other Ambulatory Visit: Payer: Self-pay | Admitting: Internal Medicine

## 2021-02-01 MED ORDER — FAMOTIDINE 40 MG PO TABS
40.0000 mg | ORAL_TABLET | Freq: Two times a day (BID) | ORAL | 1 refills | Status: DC
Start: 2021-02-01 — End: 2021-02-26

## 2021-02-05 ENCOUNTER — Encounter: Payer: 59 | Admitting: Physical Therapy

## 2021-02-07 ENCOUNTER — Encounter: Payer: 59 | Admitting: Physical Therapy

## 2021-02-13 ENCOUNTER — Encounter: Payer: 59 | Admitting: Physical Therapy

## 2021-02-15 ENCOUNTER — Encounter: Payer: 59 | Admitting: Rehabilitative and Restorative Service Providers"

## 2021-02-26 ENCOUNTER — Other Ambulatory Visit: Payer: Self-pay | Admitting: Internal Medicine

## 2021-02-28 ENCOUNTER — Ambulatory Visit: Payer: Managed Care, Other (non HMO) | Admitting: Internal Medicine

## 2021-02-28 ENCOUNTER — Encounter: Payer: Self-pay | Admitting: Internal Medicine

## 2021-02-28 VITALS — BP 112/68 | HR 62 | Ht 59.5 in | Wt 149.0 lb

## 2021-02-28 DIAGNOSIS — R140 Abdominal distension (gaseous): Secondary | ICD-10-CM

## 2021-02-28 DIAGNOSIS — K5909 Other constipation: Secondary | ICD-10-CM

## 2021-02-28 DIAGNOSIS — K219 Gastro-esophageal reflux disease without esophagitis: Secondary | ICD-10-CM

## 2021-02-28 DIAGNOSIS — Z86010 Personal history of colonic polyps: Secondary | ICD-10-CM

## 2021-02-28 DIAGNOSIS — Z860101 Personal history of adenomatous and serrated colon polyps: Secondary | ICD-10-CM

## 2021-02-28 DIAGNOSIS — K6389 Other specified diseases of intestine: Secondary | ICD-10-CM

## 2021-02-28 DIAGNOSIS — K638219 Small intestinal bacterial overgrowth, unspecified: Secondary | ICD-10-CM

## 2021-02-28 NOTE — Patient Instructions (Addendum)
° °  Check out these websites re: help with bloating SIBO and FODMAPS.  You may want to sign up for some online help. [employer] does have a free app that can help.   RightWingLunacy.co.za   SkinPromotion.no   http://www.anderson.info/    It has been recommended to you by your physician that you have a(n) colonoscopy completed. Per your request, we did not schedule the procedure(s) today. Please contact our office at [phone] should you decide to have the procedure completed. You will be scheduled for a pre-visit and procedure at that time.  Call [facility] about the bill you have [phone]   I appreciate the opportunity to care for you. Silvano Rusk, MD, [facility]

## 2021-02-28 NOTE — Progress Notes (Signed)
Sarah Barry 51 y.o. 09-Oct-1970 [id number]  Assessment & Plan:   Encounter Diagnoses  Name Primary?   Gastroesophageal reflux disease, unspecified whether esophagitis present Yes   Hx of adenomatous polyp of colon    Small intestinal bacterial overgrowth (SIBO)    Chronic constipation    Bloating     Schedule colonoscopy for history of adenomatous polyp  She can call [facility] and try to negotiate her bill downward I believe  Investigate FODMAPS dietitians, see AVS for references see if she can work on a FODMAPs diet to see if that helps with her SIBO and bloating symptoms  Remain off immune support supplement  I appreciate the opportunity to care for this patient. CC: Vernie Shanks, MD     Subjective:   Chief Complaint: Heartburn, schedule colonoscopy?  HPI The patient is a 51 year old white woman with GERD and SIBO problems last seen by me 1 year ago.  At that point we were going to retest to see if SIBO was eradicated but she was unable to complete that test "I messed it up and I gave up".  She continues to have symptoms for 20 years of bloating and difficult defecation she will use an herbal laxative about 3 times a week maximum.  She does move her bowels every day.  She constantly feels bloated.  See other notes for full details.  She had been messaging about heartburn issues and got an appointment, she was taking some sort of immune supplement pill once she stopped that her heartburn is better.  She remains on omeprazole.  She has a history of a 6 mm adenomatous polyp on colonoscopy in 2014 and is concerned about family history of colon cancer in a grandparent as well. Wt Readings from Last 3 Encounters:  02/28/21 149 lb (67.6 kg)  10/24/20 151 lb (68.5 kg)  10/18/20 149 lb 12.8 oz (67.9 kg)    No Known Allergies Current Meds  Medication Sig   Ferrous Sulfate (IRON) 325 (65 Fe) MG TABS Take 1 tablet by mouth daily.   Misc Natural Products (COLON  CLEANSE) CAPS Take 2 capsules by mouth 3 (three) times a week. Take at night   Multiple Vitamin (MULTIVITAMIN) tablet Take 1 tablet by mouth daily.   omeprazole (PRILOSEC) 40 MG capsule Take 40 mg by mouth daily.   valACYclovir (VALTREX) 500 MG tablet Take one tablet by mouth twice daily for 3 days for onset outbreak.   Past Medical History:  Diagnosis Date   Abnormal Pap smear of cervix    Allergic rhinitis    Anemia    Bipolar disorder ([place])    Cancer ([facility])    skin ca - buttocks   Chronic headache    during cycle   Colon polyp 04/2012   tubular adenoma   Depression    Dysmenorrhea 05/19/2013   Fatigue    Fibroid    Folliculitis    GERD (gastroesophageal reflux disease)    HSV infection    Positive H. pylori test    Small intestinal bacterial overgrowth (SIBO) 11/30/2019   STD (sexually transmitted disease)    genital warts   Thrombocytosis    Tongue disorder    Past Surgical History:  Procedure Laterality Date   CHOLECYSTECTOMY  2000   COLONOSCOPY  04/2012   tubular adenoma   COLPOSCOPY  2014   ESOPHAGOGASTRODUODENOSCOPY     HYSTEROSCOPY WITH D & C N/A 12/01/2013   Procedure: INSERTION OF SEPRAFILM;  Surgeon: Temple Pacini  Kerin Perna, MD;  Location: [facility];  Service: Gynecology;  Laterality: N/A;   MYOMECTOMY N/A 12/01/2013   Procedure: LAPAROSCOPY/GELPORT ASSISTED MYOMECTOMY/ADENOMYOMECTOMY;  Surgeon: Governor Specking, MD;  Location: [place];  Service: Gynecology;  Laterality: N/A;   SKIN CANCER EXCISION     buttock area, Dr. Marlou Starks   Social History   Social History Narrative   Single.  No children lives alone, has 1 dog.   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   2 caffeinated beverages daily   She is a smoker of cigarettes but no other tobacco no drug use and no alcohol   family history includes Alcohol abuse in her father; Cancer in her maternal grandfather; Colon cancer  in her maternal grandfather; Dementia in her mother; Depression in her father and maternal grandfather; Diabetes in her paternal grandmother; Heart disease (age of onset: 8) in her father; Hyperlipidemia in her father; Hypertension in her father; Osteoporosis in her maternal grandmother.   Review of Systems As per HPI  Objective:   Physical Exam @BP  112/68    Pulse 62    Ht 4' 11.5" (1.511 m)    Wt 149 lb (67.6 kg)    BMI 29.59 kg/m @  General:  NAD Eyes:   anicteric Lungs:  clear Heart::  S1S2 no rubs, murmurs or gallops Abdomen:  soft and nontender, BS+ Ext:   no edema, cyanosis or clubbing

## 2021-03-16 ENCOUNTER — Telehealth: Payer: Self-pay

## 2021-03-16 DIAGNOSIS — Z86010 Personal history of colonic polyps: Secondary | ICD-10-CM

## 2021-03-16 MED ORDER — NA SULFATE-K SULFATE-MG SULF 17.5-3.13-1.6 GM/177ML PO SOLN: 1.0000 | Freq: Once | ORAL | 0 refills | Status: AC

## 2021-03-16 NOTE — Telephone Encounter (Signed)
Sarah Barry was unable to book her colon when she was here in the office on 02/28/2021 as we didn't have our schedule out. I spoke with her and set her up for 05/04/2021 at 11:30 am. Confirmed address and will mail her instructions.She will call with questions.

## 2021-03-28 ENCOUNTER — Other Ambulatory Visit: Payer: Self-pay | Admitting: Internal Medicine

## 2021-03-28 DIAGNOSIS — K219 Gastro-esophageal reflux disease without esophagitis: Secondary | ICD-10-CM

## 2021-04-30 ENCOUNTER — Encounter: Payer: Self-pay | Admitting: Internal Medicine

## 2021-05-02 ENCOUNTER — Encounter: Payer: Self-pay | Admitting: Internal Medicine

## 2021-05-04 ENCOUNTER — Encounter: Payer: Self-pay | Admitting: Internal Medicine

## 2021-05-04 ENCOUNTER — Ambulatory Visit (AMBULATORY_SURGERY_CENTER): Payer: Managed Care, Other (non HMO) | Admitting: Internal Medicine

## 2021-05-04 ENCOUNTER — Other Ambulatory Visit: Payer: Self-pay | Admitting: Internal Medicine

## 2021-05-04 VITALS — BP 97/62 | HR 68 | Temp 98.0°F | Resp 13 | Ht 59.0 in | Wt 149.0 lb

## 2021-05-04 DIAGNOSIS — D125 Benign neoplasm of sigmoid colon: Secondary | ICD-10-CM

## 2021-05-04 DIAGNOSIS — D120 Benign neoplasm of cecum: Secondary | ICD-10-CM

## 2021-05-04 DIAGNOSIS — D122 Benign neoplasm of ascending colon: Secondary | ICD-10-CM

## 2021-05-04 DIAGNOSIS — D124 Benign neoplasm of descending colon: Secondary | ICD-10-CM

## 2021-05-04 DIAGNOSIS — Z86010 Personal history of colonic polyps: Secondary | ICD-10-CM | POA: Diagnosis present

## 2021-05-04 MED ORDER — SODIUM CHLORIDE 0.9 % IV SOLN: 500.0000 mL | Freq: Once | INTRAVENOUS | Status: DC

## 2021-05-04 NOTE — Progress Notes (Signed)
[facility] History and Physical ? ? ?Primary Care Physician:  Vernie Shanks, MD ? ? ?Reason for Procedure:   Hx adenomatous colon polyp ? ?Plan:    colonoscopy ? ? ? ? ?HPI: Sarah Barry is a 51 y.o. female w/ hx adenomatous colon polyp ? ? ?Past Medical History:  ?Diagnosis Date  ? Abnormal Pap smear of cervix   ? Allergic rhinitis   ? Anemia   ? Arthritis   ? Bipolar disorder ([place])   ? Cancer [facility])   ? skin ca - buttocks  ? Chronic headache   ? during cycle  ? Colon polyp 04/2012  ? tubular adenoma  ? Depression   ? Dysmenorrhea 05/19/2013  ? Fatigue   ? Fibroid   ? Folliculitis   ? GERD (gastroesophageal reflux disease)   ? HSV infection   ? Positive H. pylori test   ? Small intestinal bacterial overgrowth (SIBO) 11/30/2019  ? STD (sexually transmitted disease)   ? genital warts  ? Thrombocytosis   ? Tongue disorder   ? ? ?Past Surgical History:  ?Procedure Laterality Date  ? CHOLECYSTECTOMY  2000  ? COLONOSCOPY  04/2012  ? tubular adenoma  ? COLPOSCOPY  2014  ? ESOPHAGOGASTRODUODENOSCOPY    ? HYSTEROSCOPY WITH D & C N/A 12/01/2013  ? Procedure: INSERTION OF SEPRAFILM;  Surgeon: Governor Specking, MD;  Location: [facility];  Service: Gynecology;  Laterality: N/A;  ? MYOMECTOMY N/A 12/01/2013  ? Procedure: LAPAROSCOPY/GELPORT ASSISTED MYOMECTOMY/ADENOMYOMECTOMY;  Surgeon: Governor Specking, MD;  Location: [facility];  Service: Gynecology;  Laterality: N/A;  ? SKIN CANCER EXCISION    ? buttock area, Dr. Marlou Starks  ? ? ?Prior to Admission medications   ?Medication Sig Start Date End Date Taking? Authorizing Provider  ?Cholecalciferol (VITAMIN D3) 250 MCG (10000 UT) capsule 1 capsule   Yes [provider]  ?Misc Natural Products (COLON CLEANSE) CAPS Take 2 capsules by mouth 3 (three) times a week. Take at night   Yes [provider]  ?Multiple Vitamin (MULTIVITAMIN) tablet Take 1 tablet by mouth daily.   Yes [provider]  ?omeprazole (PRILOSEC) 40  MG capsule TAKE 1 CAPSULE BY MOUTH EVERY MORNING 30 MINUTES BEFORE BREAKFAST 03/28/21  Yes Gatha Mayer, MD  ?oxyCODONE-acetaminophen (PERCOCET/ROXICET) 5-325 MG tablet Take 1 tablet by mouth every 6 (six) hours as needed. 04/19/21  Yes [provider]  ?Ferrous Sulfate (IRON) 325 (65 Fe) MG TABS Take 1 tablet by mouth daily. ?Patient not taking: Reported on 05/04/2021    [provider]  ?valACYclovir (VALTREX) 500 MG tablet Take one tablet by mouth twice daily for 3 days for onset outbreak. 10/24/20   Salvadore Dom, MD  ?QUEtiapine (SEROQUEL) 100 MG tablet Take 150 mg by mouth at bedtime.    04/25/11  [provider]  ? ? ?Current Outpatient Medications  ?Medication Sig Dispense Refill  ? Cholecalciferol (VITAMIN D3) 250 MCG (10000 UT) capsule 1 capsule    ? Misc Natural Products (COLON CLEANSE) CAPS Take 2 capsules by mouth 3 (three) times a week. Take at night    ? Multiple Vitamin (MULTIVITAMIN) tablet Take 1 tablet by mouth daily.    ? omeprazole (PRILOSEC) 40 MG capsule TAKE 1 CAPSULE BY MOUTH EVERY MORNING 30 MINUTES BEFORE BREAKFAST 90 capsule 1  ? oxyCODONE-acetaminophen (PERCOCET/ROXICET) 5-325 MG tablet Take 1 tablet by mouth every 6 (six) hours as needed.    ? Ferrous Sulfate (IRON) 325 (65 Fe) MG  TABS Take 1 tablet by mouth daily. (Patient not taking: Reported on 05/04/2021)    ? valACYclovir (VALTREX) 500 MG tablet Take one tablet by mouth twice daily for 3 days for onset outbreak. 30 tablet 1  ? ?Current Facility-Administered Medications  ?Medication Dose Route Frequency Provider Last Rate Last Admin  ? 0.9 %  sodium chloride infusion  500 mL Intravenous Once Gatha Mayer, MD      ? ? ?Allergies as of 05/04/2021  ? (No Known Allergies)  ? ? ?Family History  ?Problem Relation Age of Onset  ? Colon cancer Maternal Grandfather   ? Cancer Maternal Grandfather   ?     colon cancer  ? Depression Maternal Grandfather   ? Diabetes Paternal Grandmother   ? Alcohol abuse Father    ? Heart disease Father 78  ?     MI  ? Hyperlipidemia Father   ? Hypertension Father   ? Depression Father   ? Osteoporosis Maternal Grandmother   ? Dementia Mother   ? Esophageal cancer Neg Hx   ? Rectal cancer Neg Hx   ? Stomach cancer Neg Hx   ? ? ?Social History  ? ?Socioeconomic History  ? Marital status: Single  ?  Spouse name: Not on file  ? Number of children: 0  ? Years of education: Not on file  ? Highest education level: Not on file  ?Occupational History  ? Occupation: CNA at [employer]  ?  Employer: [employer]   ?Tobacco Use  ? Smoking status: Every Day  ?  Packs/day: 1.00  ?  Years: 7.00  ?  Pack years: 7.00  ?  Types: Cigarettes  ? Smokeless tobacco: Never  ?Vaping Use  ? Vaping Use: Never used  ?Substance and Sexual Activity  ? Alcohol use: No  ?  Alcohol/week: 0.0 standard drinks  ? Drug use: No  ? Sexual activity: Yes  ?  Partners: Male  ?  Birth control/protection: None  ?Other Topics Concern  ? Not on file  ?Social History Narrative  ? Single.  No children lives alone, has 1 dog.  ? 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  ? 2 caffeinated beverages daily  ? She is a smoker of cigarettes but no other tobacco no drug use and no alcohol  ? ? of Systems: ? ?All other review of systems negative except as mentioned in the HPI. ? ?Physical Exam: ?Vital signs ?BP (!) 100/59   Pulse 83   Temp 98 ?F (36.7 ?C) (Temporal)   Ht '4\' 11"'$  (1.499 m)   Wt 149 lb (67.6 kg)   LMP 03/26/2021 (Approximate)   SpO2 97%   BMI 30.09 kg/m?  ? ?General:   Alert,  Well-developed, well-nourished, pleasant and cooperative in NAD ?Lungs:  Clear throughout to auscultation.   ?Heart:  Regular rate and rhythm; no murmurs, clicks, rubs,  or gallops. ?Abdomen:  Soft, nontender and nondistended. Normal bowel sounds.   ?Neuro/Psych:  Alert and cooperative. Normal mood and affect. A and O x 3 ? ? ?'@Laban Orourke'$  Simonne Maffucci, MD, [facility] ?[facility] ?[phone] (pager) ?05/04/2021 12:25 PM@ ? ?

## 2021-05-04 NOTE — Progress Notes (Signed)
Sedate, gd SR, tolerated procedure well, VSS, report to RN 

## 2021-05-04 NOTE — Progress Notes (Signed)
Called to room to assist during endoscopic procedure.  Patient ID and intended procedure confirmed with present staff. Received instructions for my participation in the procedure from the performing physician.  

## 2021-05-04 NOTE — Progress Notes (Signed)
Sarah Valadez CRNA relieves Berry CRNA 

## 2021-05-04 NOTE — Op Note (Signed)
[facility] ?Patient Name: Sarah Barry ?Procedure Date: 05/04/2021 12:24 PM ?MRN: [id number] ?Endoscopist: Gatha Mayer , MD ?Age: 51 ?Referring MD:  ?Date of Birth: 07/03/1970 ?Gender: Female ?Account #: [id number] ?Procedure:                Colonoscopy ?Indications:              Surveillance: Personal history of adenomatous  ?                          polyps on last colonoscopy > 5 years ago, Last  ?                          colonoscopy: 2014 ?Medicines:                Propofol per Anesthesia, Monitored Anesthesia Care ?Procedure:                Pre-Anesthesia Assessment: ?                          - Prior to the procedure, a History and Physical  ?                          was performed, and patient medications and  ?                          allergies were reviewed. The patient's tolerance of  ?                          previous anesthesia was also reviewed. The risks  ?                          and benefits of the procedure and the sedation  ?                          options and risks were discussed with the patient.  ?                          All questions were answered, and informed consent  ?                          was obtained. Prior Anticoagulants: The patient has  ?                          taken no previous anticoagulant or antiplatelet  ?                          agents. ASA Grade Assessment: II - A patient with  ?                          mild systemic disease. After reviewing the risks  ?                          and benefits, the patient was deemed in  ?  satisfactory condition to undergo the procedure. ?                          After obtaining informed consent, the colonoscope  ?                          was passed under direct vision. Throughout the  ?                          procedure, the patient's blood pressure, pulse, and  ?                          oxygen saturations were monitored continuously. The  ?                          CF [id number] #[id number] was  introduced through the anus  ?                          and advanced to the the cecum, identified by  ?                          appendiceal orifice and ileocecal valve. The  ?                          colonoscopy was performed without difficulty. The  ?                          patient tolerated the procedure well. The quality  ?                          of the bowel preparation was good. The ileocecal  ?                          valve, appendiceal orifice, and rectum were  ?                          photographed. The bowel preparation used was SUPREP  ?                          via split dose instruction. ?Scope In: 12:34:34 PM ?Scope Out: 76:19:50 PM ?Scope Withdrawal Time: 0 hours 10 minutes 8 seconds  ?Total Procedure Duration: 0 hours 11 minutes 37 seconds  ?Findings:                 The perianal and digital rectal examinations were  ?                          normal. ?                          Three sessile polyps were found in the sigmoid  ?                          colon, transverse colon and cecum. The polyps were  ?  diminutive in size. These polyps were removed with  ?                          a cold snare. Resection and retrieval were  ?                          complete. Verification of patient identification  ?                          for the specimen was done. Estimated blood loss was  ?                          minimal. ?                          A 1 mm polyp was found in the descending colon. The  ?                          polyp was sessile. Biopsies were taken with a cold  ?                          forceps for histology. Verification of patient  ?                          identification for the specimen was done. Estimated  ?                          blood loss was minimal. ?                          A diffuse area of melanotic mucosa was found in the  ?                          entire colon. ?                          The exam was otherwise without abnormality on  ?                           direct and retroflexion views. ?Complications:            No immediate complications. ?Estimated Blood Loss:     Estimated blood loss was minimal. ?Impression:               - Three diminutive polyps in the sigmoid colon, in  ?                          the transverse colon and in the cecum, removed with  ?                          a cold snare. Resected and retrieved. ?                          - One 1 mm polyp in the descending colon. Biopsied. ?                          -  Melanotic mucosa in the entire examined colon. ?                          - The examination was otherwise normal on direct  ?                          and retroflexion views. ?Recommendation:           - Patient has a contact number available for  ?                          emergencies. The signs and symptoms of potential  ?                          delayed complications were discussed with the  ?                          patient. Return to normal activities tomorrow.  ?                          Written discharge instructions were provided to the  ?                          patient. ?                          - Resume previous diet. ?                          - Continue present medications. ?                          - Await pathology results. ?                          - Repeat colonoscopy is recommended for  ?                          surveillance. The colonoscopy date will be  ?                          determined after pathology results from today's  ?                          exam become available for review. ?Gatha Mayer, MD ?05/04/2021 12:52:57 PM ?This report has been signed electronically. ?

## 2021-05-04 NOTE — Patient Instructions (Addendum)
I removed 4 tiny polyps today. ?I will let you know pathology results and when to have another routine colonoscopy by mail and/or My Chart. ? ?I appreciate the opportunity to care for you. ?Gatha Mayer, MD, [facility] ? ?Please read handouts provided. ?Continue present medications. ?Await pathology results. ? ? ?YOU HAD AN ENDOSCOPIC PROCEDURE TODAY AT [facility]:   Refer to the procedure report that was given to you for any specific questions about what was found during the examination.  If the procedure report does not answer your questions, please call your gastroenterologist to clarify.  If you requested that your care partner not be given the details of your procedure findings, then the procedure report has been included in a sealed envelope for you to review at your convenience later. ? ?YOU SHOULD EXPECT: Some feelings of bloating in the abdomen. Passage of more gas than usual.  Walking can help get rid of the air that was put into your GI tract during the procedure and reduce the bloating. If you had a lower endoscopy (such as a colonoscopy or flexible sigmoidoscopy) you may notice spotting of blood in your stool or on the toilet paper. If you underwent a bowel prep for your procedure, you may not have a normal bowel movement for a few days. ? ?Please Note:  You might notice some irritation and congestion in your nose or some drainage.  This is from the oxygen used during your procedure.  There is no need for concern and it should clear up in a day or so. ? ?SYMPTOMS TO REPORT IMMEDIATELY: ? ?Following lower endoscopy (colonoscopy or flexible sigmoidoscopy): ? Excessive amounts of blood in the stool ? Significant tenderness or worsening of abdominal pains ? Swelling of the abdomen that is new, acute ? Fever of 100?F or higher ? ? ?For urgent or emergent issues, a gastroenterologist can be reached at any hour by calling [phone]. ?Do not use MyChart messaging for urgent concerns.   ? ? ?DIET:  We do recommend a small meal at first, but then you may proceed to your regular diet.  Drink plenty of fluids but you should avoid alcoholic beverages for 24 hours. ? ?ACTIVITY:  You should plan to take it easy for the rest of today and you should NOT DRIVE or use heavy machinery until tomorrow (because of the sedation medicines used during the test).   ? ?FOLLOW UP: ?Our staff will call the number listed on your records 48-72 hours following your procedure to check on you and address any questions or concerns that you may have regarding the information given to you following your procedure. If we do not reach you, we will leave a message.  We will attempt to reach you two times.  During this call, we will ask if you have developed any symptoms of COVID 19. If you develop any symptoms (ie: fever, flu-like symptoms, shortness of breath, cough etc.) before then, please call [phone].  If you test positive for Covid 19 in the 2 weeks post procedure, please call and report this information to [place].   ? ?If any biopsies were taken you will be contacted by phone or by letter within the next 1-3 weeks.  Please call us at [phone] if you have not heard about the biopsies in 3 weeks.  ? ? ?SIGNATURES/CONFIDENTIALITY: ?You and/or your care partner have signed paperwork which will be entered into your electronic medical record.  These signatures attest to the  fact that that the information above on your After Visit Summary has been reviewed and is understood.  Full responsibility of the confidentiality of this discharge information lies with you and/or your care-partner.  ?

## 2021-05-04 NOTE — Progress Notes (Signed)
[facility]-DT 

## 2021-05-08 ENCOUNTER — Telehealth: Payer: Self-pay

## 2021-05-08 ENCOUNTER — Telehealth: Payer: Self-pay | Admitting: *Deleted

## 2021-05-08 NOTE — Telephone Encounter (Signed)
First attempt follow up call to pt, no answer. 

## 2021-05-08 NOTE — Telephone Encounter (Signed)
?  Follow up Call- ? ?Call back number 05/04/2021  ?Post procedure Call Back phone  # [phone]  ?Permission to leave phone message Yes  ?Some recent data might be hidden  ?  ? ?Patient questions: ? ?Do you have a fever, pain , or abdominal swelling? No. ?Pain Score  0 * ? ?Have you tolerated food without any problems? Yes.   ? ?Have you been able to return to your normal activities? Yes.   ? ?Do you have any questions about your discharge instructions: ?Diet   No. ?Medications  No. ?Follow up visit  No. ? ?Do you have questions or concerns about your Care? No. ? ?Actions: ?* If pain score is 4 or above: ?No action needed, pain <4. ? ? ?

## 2021-05-14 ENCOUNTER — Encounter: Payer: Self-pay | Admitting: Internal Medicine

## 2021-06-01 ENCOUNTER — Other Ambulatory Visit: Payer: Self-pay | Admitting: Sports Medicine

## 2021-06-01 ENCOUNTER — Ambulatory Visit
Admission: RE | Admit: 2021-06-01 | Discharge: 2021-06-01 | Disposition: A | Discharge status: Home / Self Care | Payer: Managed Care, Other (non HMO) | Source: Ambulatory Visit | Attending: Sports Medicine | Admitting: Sports Medicine

## 2021-06-01 DIAGNOSIS — M25511 Pain in right shoulder: Secondary | ICD-10-CM

## 2021-09-07 ENCOUNTER — Other Ambulatory Visit: Payer: Self-pay | Admitting: Internal Medicine

## 2021-09-13 ENCOUNTER — Other Ambulatory Visit: Payer: Self-pay | Admitting: Obstetrics and Gynecology

## 2021-09-13 DIAGNOSIS — A6000 Herpesviral infection of urogenital system, unspecified: Secondary | ICD-10-CM

## 2021-09-13 NOTE — Telephone Encounter (Signed)
Last annual exam was 10/2020

## 2021-10-23 NOTE — Progress Notes (Signed)
51 y.o. G0P0000 Single White or Caucasian Not Hispanic or Latino female here for annual exam.  She has questions about ovarian cancer. She has a 20 year h/o bloating and constipation. She has had lots of testing, everything has been negative. She has to take a colon cleans 3-4 x a weeks, then she will have a BM every day. She has tried metamucil, prunes, magnesium. She gets so bloated that she is miserable. Feels like she looks 9 months pregnant. No help with gas-ex or beano. Stays well hydrated.  Period Cycle (Days): 28 Period Duration (Days): 5 Period Pattern: Regular Menstrual Flow: Moderate Menstrual Control: Tampon Menstrual Control Change Freq (Hours): 4 Dysmenorrhea: (!) Mild Dysmenorrhea Symptoms: Cramping, Headache  No vasomotor symptoms.   Not currently sexually active, live in partner has ED.   H/O HSV, occasional outbreaks, she will reach out when she needs a refill.   Patient's last menstrual period was 10/25/2021.          Sexually active: No.  The current method of family planning is abstinence.    Exercising: Yes.     Cardio  Smoker:  yes, 1 PPD. She is planning to quit.   Health Maintenance: Pap:  03-24-19 normal, negative hpv; 06/27/14 WNL History of abnormal Pap:  no MMG:  11/13/20  BMD:   n/a Colonoscopy: 05/04/21 polyps f/u 5years  TDaP:  02/02/12 Gardasil: n/a   reports that she has been smoking cigarettes. She has a 7.00 pack-year smoking history. She has never used smokeless tobacco. She reports that she does not drink alcohol and does not use drugs. She does nail care for the elderly. She lives with her partner of 2 years, they are very happy.  Past Medical History:  Diagnosis Date   Abnormal Pap smear of cervix    Allergic rhinitis    Anemia    Arthritis    Bipolar disorder ([place])    Cancer ([facility])    skin ca - buttocks   Chronic headache    during cycle   Colon polyp 04/2012   tubular adenoma   Depression    Dysmenorrhea 05/19/2013   Fatigue     Fibroid    Folliculitis    GERD (gastroesophageal reflux disease)    HSV infection    Positive H. pylori test    Small intestinal bacterial overgrowth (SIBO) 11/30/2019   STD (sexually transmitted disease)    genital warts   Thrombocytosis    Tongue disorder     Past Surgical History:  Procedure Laterality Date   CHOLECYSTECTOMY  2000   COLONOSCOPY  04/2012   tubular adenoma   COLPOSCOPY  2014   ESOPHAGOGASTRODUODENOSCOPY     HYSTEROSCOPY WITH D & C N/A 12/01/2013   Procedure: INSERTION OF SEPRAFILM;  Surgeon: Governor Specking, MD;  Location: [facility];  Service: Gynecology;  Laterality: N/A;   MYOMECTOMY N/A 12/01/2013   Procedure: LAPAROSCOPY/GELPORT ASSISTED MYOMECTOMY/ADENOMYOMECTOMY;  Surgeon: Governor Specking, MD;  Location: [facility];  Service: Gynecology;  Laterality: N/A;   SKIN CANCER EXCISION     buttock area, Dr. Marlou Starks    Current Outpatient Medications  Medication Sig Dispense Refill   B-COMPLEX-C PO Take by mouth.     Cholecalciferol (VITAMIN D-3) 25 MCG (1000 UT) CAPS Take by mouth.     magnesium gluconate (MAGONATE) 500 MG tablet Take 500 mg by mouth 2 (two) times daily.     Misc Natural Products (COLON CLEANSE) CAPS Take 2 capsules by mouth 3 (three)  times a week. Take at night     Multiple Vitamin (MULTIVITAMIN) tablet Take 1 tablet by mouth daily.     Omega-3 Fatty Acids (OMEGA-3 2100 PO) Take by mouth.     omeprazole (PRILOSEC) 40 MG capsule TAKE 1 CAPSULE BY MOUTH EVERY MORNING 30 MINUTES BEFORE BREAKFAST 30 capsule 5   oxyCODONE-acetaminophen (PERCOCET/ROXICET) 5-325 MG tablet Take 1 tablet by mouth every 6 (six) hours as needed.     valACYclovir (VALTREX) 500 MG tablet TAKE 1 TABLET (500 MG TOTAL) BY MOUTH 2 (TWO) TIMES DAILY FOR 3 DAYS as needed 30 tablet 1   No current facility-administered medications for this visit.    Family History  Problem Relation Age of Onset   Colon cancer Maternal Grandfather    Cancer  Maternal Grandfather        colon cancer   Depression Maternal Grandfather    Diabetes Paternal Grandmother    Alcohol abuse Father    Heart disease Father 63       MI   Hyperlipidemia Father    Hypertension Father    Depression Father    Osteoporosis Maternal Grandmother    Dementia Mother    Esophageal cancer Neg Hx    Rectal cancer Neg Hx    Stomach cancer Neg Hx     Review of Systems  All other systems reviewed and are negative.   Exam:   BP 118/72   Pulse 76   Ht 5' (1.524 m)   Wt 148 lb (67.1 kg)   LMP 10/25/2021   SpO2 100%   BMI 28.90 kg/m   Weight change: '@WEIGHTCHANGE'$ @ Height:   Height: 5' (152.4 cm)  Ht Readings from Last 3 Encounters:  10/31/21 5' (1.524 m)  05/04/21 '4\' 11"'$  (1.499 m)  02/28/21 4' 11.5" (1.511 m)    General appearance: alert, cooperative and appears stated age Head: Normocephalic, without obvious abnormality, atraumatic Neck: no adenopathy, supple, symmetrical, trachea midline and thyroid normal to inspection and palpation Lungs: clear to auscultation bilaterally Cardiovascular: regular rate and rhythm Breasts: normal appearance, no masses or tenderness Abdomen: soft, non-tender; non distended,  no masses,  no organomegaly Extremities: extremities normal, atraumatic, no cyanosis or edema Skin: Skin color, texture, turgor normal. No rashes or lesions Lymph nodes: Cervical, supraclavicular, and axillary nodes normal. No abnormal inguinal nodes palpated Neurologic: Grossly normal   Pelvic: External genitalia:  no lesions              Urethra:  normal appearing urethra with no masses, tenderness or lesions              Bartholins and Skenes: normal                 Vagina: normal appearing vagina with normal color and discharge, no lesions              Cervix: no lesions               Bimanual Exam:  Uterus:  normal size, contour, position, consistency, mobility, non-tender and anteverted              Adnexa: no mass, fullness,  tenderness               Rectovaginal: Confirms               Anus:  normal sphincter tone, no lesions  Gae Dry, CMA chaperoned for the exam.  1. Well woman exam Discussed breast self exam Discussed calcium and vit  D intake Labs with primary No pap this year Mammogram due, she will schedule  2. Immunization due - Tdap vaccine greater than or equal to 7yo IM  3. Herpes simplex infection of genitourinary system She will call when needs a refill.   4. Abdominal bloating She has had bloating for 20 years. Has seen multiple specialists. Given reading information from [facility], discussed.

## 2021-10-31 ENCOUNTER — Encounter: Payer: Self-pay | Admitting: Obstetrics and Gynecology

## 2021-10-31 ENCOUNTER — Ambulatory Visit (INDEPENDENT_AMBULATORY_CARE_PROVIDER_SITE_OTHER): Payer: Commercial Managed Care - HMO | Admitting: Obstetrics and Gynecology

## 2021-10-31 VITALS — BP 118/72 | HR 76 | Ht 60.0 in | Wt 148.0 lb

## 2021-10-31 DIAGNOSIS — A6000 Herpesviral infection of urogenital system, unspecified: Secondary | ICD-10-CM | POA: Diagnosis not present

## 2021-10-31 DIAGNOSIS — R140 Abdominal distension (gaseous): Secondary | ICD-10-CM | POA: Diagnosis not present

## 2021-10-31 DIAGNOSIS — Z23 Encounter for immunization: Secondary | ICD-10-CM | POA: Diagnosis not present

## 2021-10-31 DIAGNOSIS — Z01419 Encounter for gynecological examination (general) (routine) without abnormal findings: Secondary | ICD-10-CM

## 2021-10-31 NOTE — Patient Instructions (Addendum)
EXERCISE   We recommended that you start or continue a regular exercise program for good health. Physical activity is anything that gets your body moving, some is better than none. The CDC recommends 150 minutes per week of Moderate-Intensity Aerobic Activity and 2 or more days of Muscle Strengthening Activity.  Benefits of exercise are limitless: helps weight loss/weight maintenance, improves mood and energy, helps with depression and anxiety, improves sleep, tones and strengthens muscles, improves balance, improves bone density, protects from chronic conditions such as heart disease, high blood pressure and diabetes and so much more. To learn more visit: https://www.cdc.gov/physicalactivity/index.html  DIET: Good nutrition starts with a healthy diet of fruits, vegetables, whole grains, and lean protein sources. Drink plenty of water for hydration. Minimize empty calories, sodium, sweets. For more information about dietary recommendations visit: https://health.gov/our-work/nutrition-physical-activity/dietary-guidelines and https://www.myplate.gov/  ALCOHOL:  Women should limit their alcohol intake to no more than 7 drinks/beers/glasses of wine (combined, not each!) per week. Moderation of alcohol intake to this level decreases your risk of breast cancer and liver damage.  If you are concerned that you may have a problem, or your friends have told you they are concerned about your drinking, there are many resources to help. A well-known program that is free, effective, and available to all people all over the nation is Alcoholics Anonymous.  Check out this site to learn more: https://www.aa.org/   CALCIUM AND VITAMIN D:  Adequate intake of calcium and Vitamin D are recommended for bone health.  You should be getting between 1000-1200 mg of calcium and 800 units of Vitamin D daily between diet and supplements  PAP SMEARS:  Pap smears, to check for cervical cancer or precancers,  have traditionally been  done yearly, scientific advances have shown that most women can have pap smears less often.  However, every woman still should have a physical exam from her gynecologist every year. It will include a breast check, inspection of the vulva and vagina to check for abnormal growths or skin changes, a visual exam of the cervix, and then an exam to evaluate the size and shape of the uterus and ovaries. We will also provide age appropriate advice regarding health maintenance, like when you should have certain vaccines, screening for sexually transmitted diseases, bone density testing, colonoscopy, mammograms, etc.   MAMMOGRAMS:  All women over 40 years old should have a routine mammogram.   COLON CANCER SCREENING: Now recommend starting at age 45. At this time colonoscopy is not covered for routine screening until 50. There are take home tests that can be done between 45-49.   COLONOSCOPY:  Colonoscopy to screen for colon cancer is recommended for all women at age 50.  We know, you hate the idea of the prep.  We agree, BUT, having colon cancer and not knowing it is worse!!  Colon cancer so often starts as a polyp that can be seen and removed at colonscopy, which can quite literally save your life!  And if your first colonoscopy is normal and you have no family history of colon cancer, most women don't have to have it again for 10 years.  Once every ten years, you can do something that may end up saving your life, right?  We will be happy to help you get it scheduled when you are ready.  Be sure to check your insurance coverage so you understand how much it will cost.  It may be covered as a preventative service at no cost, but you should check   your particular policy.      Breast Self-Awareness Breast self-awareness means being familiar with how your breasts look and feel. It involves checking your breasts regularly and reporting any changes to your health care provider. Practicing breast self-awareness is  important. A change in your breasts can be a sign of a serious medical problem. Being familiar with how your breasts look and feel allows you to find any problems early, when treatment is more likely to be successful. All women should practice breast self-awareness, including women who have had breast implants. How to do a breast self-exam One way to learn what is normal for your breasts and whether your breasts are changing is to do a breast self-exam. To do a breast self-exam: Look for Changes  Remove all the clothing above your waist. Stand in front of a mirror in a room with good lighting. Put your hands on your hips. Push your hands firmly downward. Compare your breasts in the mirror. Look for differences between them (asymmetry), such as: Differences in shape. Differences in size. Puckers, dips, and bumps in one breast and not the other. Look at each breast for changes in your skin, such as: Redness. Scaly areas. Look for changes in your nipples, such as: Discharge. Bleeding. Dimpling. Redness. A change in position. Feel for Changes Carefully feel your breasts for lumps and changes. It is best to do this while lying on your back on the floor and again while sitting or standing in the shower or tub with soapy water on your skin. Feel each breast in the following way: Place the arm on the side of the breast you are examining above your head. Feel your breast with the other hand. Start in the nipple area and make  inch (2 cm) overlapping circles to feel your breast. Use the pads of your three middle fingers to do this. Apply light pressure, then medium pressure, then firm pressure. The light pressure will allow you to feel the tissue closest to the skin. The medium pressure will allow you to feel the tissue that is a little deeper. The firm pressure will allow you to feel the tissue close to the ribs. Continue the overlapping circles, moving downward over the breast until you feel your  ribs below your breast. Move one finger-width toward the center of the body. Continue to use the  inch (2 cm) overlapping circles to feel your breast as you move slowly up toward your collarbone. Continue the up and down exam using all three pressures until you reach your armpit.  Write Down What You Find  Write down what is normal for each breast and any changes that you find. Keep a written record with breast changes or normal findings for each breast. By writing this information down, you do not need to depend only on memory for size, tenderness, or location. Write down where you are in your menstrual cycle, if you are still menstruating. If you are having trouble noticing differences in your breasts, do not get discouraged. With time you will become more familiar with the variations in your breasts and more comfortable with the exam. How often should I examine my breasts? Examine your breasts every month. If you are breastfeeding, the best time to examine your breasts is after a feeding or after using a breast pump. If you menstruate, the best time to examine your breasts is 5-7 days after your period is over. During your period, your breasts are lumpier, and it may be more   difficult to notice changes. When should I see my health care provider? See your health care provider if you notice: A change in shape or size of your breasts or nipples. A change in the skin of your breast or nipples, such as a reddened or scaly area. Unusual discharge from your nipples. A lump or thick area that was not there before. Pain in your breasts. Anything that concerns you. Abdominal Bloating When you have abdominal bloating, your abdomen may feel full, tight, or painful. It may also look bigger than normal or swollen (distended). Common causes of abdominal bloating include: Swallowing air. Constipation. Problems digesting food. Eating too much. Irritable bowel syndrome. This is a condition that affects the  large intestine. Lactose intolerance. This is an inability to digest lactose, a natural sugar in dairy products. Celiac disease. This is a condition that affects the ability to digest gluten, a protein found in some grains. Gastroparesis. This is a condition that slows down the movement of food in the stomach and small intestine. It is more common in people with diabetes mellitus. Gastroesophageal reflux disease (GERD). This is a condition that makes stomach acid flow back into the esophagus. Urinary retention. This means that the body is holding onto urine, and the bladder cannot be emptied all the way. Follow these instructions at home: Eating and drinking Avoid eating too much. Try not to swallow air while talking or eating. Avoid eating while lying down. Avoid these foods and drinks: Foods that cause gas, such as broccoli, cabbage, cauliflower, and baked beans. Carbonated drinks. Hard candy. Chewing gum. Medicines Take over-the-counter and prescription medicines only as told by your health care provider. Take probiotic medicines. These medicines contain live bacteria or yeasts that can help digestion. Take coated peppermint oil capsules. General instructions Try to exercise regularly. Exercise may help to relieve bloating that is caused by gas and relieve constipation. Keep all follow-up visits. This is important. Contact a health care provider if: You have nausea and vomiting. You have diarrhea. You have abdominal pain. You have unusual weight loss or weight gain. You have severe pain, and medicines do not help. Get help right away if: You have chest pain. You have trouble breathing. You have shortness of breath. You have trouble urinating. You have darker urine than normal. You have blood in your stools or have dark, tarry stools. These symptoms may represent a serious problem that is an emergency. Do not wait to see if the symptoms will go away. Get medical help right away.  Call your local emergency services (911 in the [place] Do not drive yourself to the hospital. Summary Abdominal bloating means that the abdomen is swollen. Common causes of abdominal bloating are swallowing air, constipation, and problems digesting food. Avoid eating too much and avoid swallowing air. Avoid foods that cause gas, carbonated drinks, hard candy, and chewing gum. This information is not intended to replace advice given to you by your health care provider. Make sure you discuss any questions you have with your health care provider. Document Revised: 09/07/2019 Document Reviewed: 09/07/2019 Elsevier Patient Education  [place].

## 2021-11-12 ENCOUNTER — Other Ambulatory Visit (HOSPITAL_BASED_OUTPATIENT_CLINIC_OR_DEPARTMENT_OTHER): Payer: Self-pay | Admitting: Obstetrics and Gynecology

## 2021-11-12 DIAGNOSIS — Z1231 Encounter for screening mammogram for malignant neoplasm of breast: Secondary | ICD-10-CM

## 2021-11-13 ENCOUNTER — Ambulatory Visit (HOSPITAL_BASED_OUTPATIENT_CLINIC_OR_DEPARTMENT_OTHER)
Admission: RE | Admit: 2021-11-13 | Discharge: 2021-11-13 | Disposition: A | Discharge status: Home / Self Care | Payer: Commercial Managed Care - HMO | Source: Ambulatory Visit | Attending: Obstetrics and Gynecology | Admitting: Obstetrics and Gynecology

## 2021-11-13 DIAGNOSIS — Z1231 Encounter for screening mammogram for malignant neoplasm of breast: Secondary | ICD-10-CM | POA: Insufficient documentation

## 2022-03-01 ENCOUNTER — Other Ambulatory Visit: Payer: Self-pay | Admitting: Internal Medicine

## 2022-04-18 ENCOUNTER — Other Ambulatory Visit: Payer: Self-pay | Admitting: Sports Medicine

## 2022-04-18 DIAGNOSIS — M25511 Pain in right shoulder: Secondary | ICD-10-CM

## 2022-05-16 ENCOUNTER — Ambulatory Visit
Admission: RE | Admit: 2022-05-16 | Discharge: 2022-05-16 | Disposition: A | Discharge status: Home / Self Care | Payer: Commercial Managed Care - HMO | Source: Ambulatory Visit | Attending: Sports Medicine | Admitting: Sports Medicine

## 2022-05-16 DIAGNOSIS — M25511 Pain in right shoulder: Secondary | ICD-10-CM

## 2022-05-24 ENCOUNTER — Other Ambulatory Visit: Payer: Self-pay | Admitting: Internal Medicine

## 2022-07-25 ENCOUNTER — Other Ambulatory Visit: Payer: Self-pay | Admitting: Internal Medicine

## 2022-10-16 ENCOUNTER — Encounter: Payer: Self-pay | Admitting: Obstetrics and Gynecology

## 2022-10-16 DIAGNOSIS — A6000 Herpesviral infection of urogenital system, unspecified: Secondary | ICD-10-CM

## 2022-10-17 MED ORDER — VALACYCLOVIR HCL 500 MG PO TABS: ORAL_TABLET | ORAL | 0 refills | Status: DC

## 2022-10-17 NOTE — Telephone Encounter (Signed)
Med refill request: valacyclovir 500 mg tab PO BID x 3 days PRN  Last AEX: 10/31/21 -JJ Next AEX: 11/08/22 -EB Last MMG (if hormonal med) N/A  Hx HSV 2  Rx pended #30/0RF   Refill authorized: Please Advise?

## 2022-10-18 ENCOUNTER — Other Ambulatory Visit: Payer: Self-pay | Admitting: Internal Medicine

## 2022-11-08 ENCOUNTER — Other Ambulatory Visit (HOSPITAL_COMMUNITY)
Admission: RE | Admit: 2022-11-08 | Discharge: 2022-11-08 | Disposition: A | Discharge status: Home / Self Care | Payer: Commercial Managed Care - HMO | Source: Ambulatory Visit | Attending: Obstetrics and Gynecology | Admitting: Obstetrics and Gynecology

## 2022-11-08 ENCOUNTER — Encounter: Payer: Self-pay | Admitting: Obstetrics and Gynecology

## 2022-11-08 ENCOUNTER — Ambulatory Visit (INDEPENDENT_AMBULATORY_CARE_PROVIDER_SITE_OTHER): Payer: Commercial Managed Care - HMO | Admitting: Obstetrics and Gynecology

## 2022-11-08 VITALS — BP 126/80 | HR 81 | Ht 60.5 in | Wt 146.0 lb

## 2022-11-08 DIAGNOSIS — L918 Other hypertrophic disorders of the skin: Secondary | ICD-10-CM | POA: Diagnosis not present

## 2022-11-08 DIAGNOSIS — N938 Other specified abnormal uterine and vaginal bleeding: Secondary | ICD-10-CM

## 2022-11-08 DIAGNOSIS — Z1231 Encounter for screening mammogram for malignant neoplasm of breast: Secondary | ICD-10-CM

## 2022-11-08 DIAGNOSIS — Z01419 Encounter for gynecological examination (general) (routine) without abnormal findings: Secondary | ICD-10-CM

## 2022-11-08 NOTE — Progress Notes (Signed)
52 y.o. y.o. female here for annual exam. Reports regular periods q month up to 08/25/22 then had unusual string like discharge with clotting daily for a month. The next month she had a period for one day  HRTdenies History of fibroids with myomectomy  Blood pressure 126/80, pulse 81, height 5' 0.5" (1.537 m), weight 146 lb (66.2 kg), last menstrual period 10/03/2022, SpO2 97%.     Component Value Date/Time   DIAGPAP  03/24/2019 1535    - Negative for intraepithelial lesion or malignancy (NILM)   HPVHIGH Negative 03/24/2019 1535   ADEQPAP  03/24/2019 1535    Satisfactory for evaluation; transformation zone component PRESENT.    GYN HISTORY:    Component Value Date/Time   DIAGPAP  03/24/2019 1535    - Negative for intraepithelial lesion or malignancy (NILM)   HPVHIGH Negative 03/24/2019 1535   ADEQPAP  03/24/2019 1535    Satisfactory for evaluation; transformation zone component PRESENT.    OB History  Gravida Para Term Preterm AB Living  0 0 0 0 0 0  SAB IAB Ectopic Multiple Live Births  0 0 0 0      Past Medical History:  Diagnosis Date   Abnormal Pap smear of cervix    Allergic rhinitis    Anemia    Arthritis    Bipolar disorder (HCC)    Cancer (HCC)    skin ca - buttocks   Chronic headache    during cycle   Colon polyp 04/2012   tubular adenoma   Depression    Dysmenorrhea 05/19/2013   Fatigue    Fibroid    Folliculitis    GERD (gastroesophageal reflux disease)    HSV infection    Positive H. pylori test    Small intestinal bacterial overgrowth (SIBO) 11/30/2019   STD (sexually transmitted disease)    genital warts   Thrombocytosis    Tongue disorder     Past Surgical History:  Procedure Laterality Date   CHOLECYSTECTOMY  2000   COLONOSCOPY  04/2012   tubular adenoma   COLPOSCOPY  2014   ESOPHAGOGASTRODUODENOSCOPY     HYSTEROSCOPY WITH D & C N/A 12/01/2013   Procedure: INSERTION OF SEPRAFILM;  Surgeon: Fermin Schwab, MD;  Location: [facility];  Service: Gynecology;  Laterality: N/A;   MYOMECTOMY N/A 12/01/2013   Procedure: LAPAROSCOPY/GELPORT ASSISTED MYOMECTOMY/ADENOMYOMECTOMY;  Surgeon: Fermin Schwab, MD;  Location: [facility];  Service: Gynecology;  Laterality: N/A;   SKIN CANCER EXCISION     buttock area, Dr. Carolynne Edouard    Current Outpatient Medications on File Prior to Visit  Medication Sig Dispense Refill   B-COMPLEX-C PO Take by mouth.     Cholecalciferol (VITAMIN D-3) 25 MCG (1000 UT) CAPS Take by mouth.     magnesium gluconate (MAGONATE) 500 MG tablet Take 500 mg by mouth 2 (two) times daily.     meloxicam (MOBIC) 15 MG tablet Take 15 mg by mouth daily.     mirtazapine (REMERON) 30 MG tablet      Multiple Vitamin (MULTIVITAMIN) tablet Take 1 tablet by mouth daily.     Omega-3 Fatty Acids (OMEGA-3 2100 PO) Take by mouth.     omeprazole (PRILOSEC) 40 MG capsule TAKE 1 CAPSULE BY MOUTH EVERY MORNING 30 MINUTES BEFORE BREAKFAST 30 capsule 2   oxyCODONE-acetaminophen (PERCOCET) 10-325 MG tablet Take 0.5-1 tablets by mouth every 4 (four) hours as needed.     tretinoin (RETIN-A) 0.025 % cream Apply 1 a  small amount to affected area at bedtime     valACYclovir (VALTREX) 500 MG tablet TAKE 1 TABLET (500 MG TOTAL) BY MOUTH 2 (TWO) TIMES DAILY FOR 3 DAYS as needed 30 tablet 0   cyclobenzaprine (FLEXERIL) 5 MG tablet Take 5 mg by mouth every 8 (eight) hours as needed. (Patient not taking: Reported on 11/08/2022)     [DISCONTINUED] QUEtiapine (SEROQUEL) 100 MG tablet Take 150 mg by mouth at bedtime.       No current facility-administered medications on file prior to visit.    Social History   Socioeconomic History   Marital status: Single    Spouse name: Not on file   Number of children: 0   Years of education: Not on file   Highest education level: Not on file  Occupational History   Occupation: CNA at [employer]    Employer: [employer]   Tobacco Use   Smoking  status: Every Day    Current packs/day: 1.00    Average packs/day: 1 pack/day for 7.0 years (7.0 ttl pk-yrs)    Types: Cigarettes   Smokeless tobacco: Never  Vaping Use   Vaping status: Never Used  Substance and Sexual Activity   Alcohol use: No    Alcohol/week: 0.0 standard drinks of alcohol   Drug use: No   Sexual activity: Not Currently    Partners: Male    Birth control/protection: None  Other Topics Concern   Not on file  Social History Narrative   Single.  No children lives alone, has 1 dog.   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   2 caffeinated beverages daily   She is a smoker of cigarettes but no other tobacco no drug use and no alcohol   Social Determinants of Corporate investment banker Strain: Not on file  Food Insecurity: Not on file  Transportation Needs: Not on file  Physical Activity: Not on file  Stress: Not on file  Social Connections: Not on file  Intimate Partner Violence: Not on file    Family History  Problem Relation Age of Onset   Colon cancer Maternal Grandfather    Cancer Maternal Grandfather        colon cancer   Depression Maternal Grandfather    Diabetes Paternal Grandmother    Alcohol abuse Father    Heart disease Father 59       MI   Hyperlipidemia Father    Hypertension Father    Depression Father    Osteoporosis Maternal Grandmother    Dementia Mother    Esophageal cancer Neg Hx    Rectal cancer Neg Hx    Stomach cancer Neg Hx      No Known Allergies    Patient's last menstrual period was Patient's last menstrual period was 10/03/2022.Marland Kitchen            Review of Systems Alls systems reviewed and are negative.     PE General appearance: alert, cooperative and appears stated age Head: Normocephalic, without obvious abnormality, atraumatic Neck: no adenopathy, supple, symmetrical, trachea midline and thyroid normal to inspection and palpation Lungs: clear to  auscultation bilaterally Breasts: normal appearance, no masses or tenderness Heart: regular rate and rhythm Abdomen: soft, non-tender; bowel sounds normal; no masses,  no organomegaly Extremities: extremities normal, atraumatic, no cyanosis or edema Skin: Skin color, texture, turgor normal. No rashes or lesions Lymph nodes: Cervical, supraclavicular, and axillary nodes normal. No  abnormal inguinal nodes palpated Neurologic: Grossly normal     Pelvic: External genitalia:  no lesions              Urethra:  normal appearing urethra with no masses, tenderness or lesions              Bartholins and Skenes: normal                 Vagina: normal appearing vagina with normal color and discharge, no lesions.               Cervix: no lesions, no cervical motion tenderness               Bimanual Exam:  Uterus:  increased size,  irregular contour, position, consistency, mobility, non-tender              Adnexa: no mass, fullness, tenderness Left lateral to annal region small skin tag          Chaperone was present for exam.    US PELVIS LIMITED (TRANSABDOMINAL ONLY) (Accession [id number]) (Order [id number]) Imaging Date: 09/21/2019 Department: [facility] Released By: Serita Sheller Authorizing: Romualdo Bolk, MD   Exam Status  Status  Final [99]   PACS Intelerad Image Link   Show images for US PELVIS LIMITED (TRANSABDOMINAL ONLY) Related Results   [place] Sonohysterogram Final result 09/21/2019 1:40 PM    Narrative  SEE PROGRESS NOTES FOR RESULTS       Result Narrative  SEE PROGRESS NOTES FOR RESULTS   Result History  US PELVIS LIMITED (TRANSABDOMINAL ONLY) (Order #[id number]) on 09/21/2019 - Order Result History Report A:         [facility] GYN exam with DUB, likely recurrent fibroids on exam today, reports bothersome skin tag                             P:        Pap smear collected             Encouraged annual mammogram screening                        Labs and immunizations with her primary             Discussed breast self exams             Encouraged safe sexual practices and enouraged healthy lifestyle practices with diet and exercise RTC for EMB and skin tag removal.  Counseled on the importance of the EMB to rule out precancerous or cancerous cells with DUB over the age of 53.  May need RLH in the future with persistent bleeding.  Offered aygestin for now as well. To also complete TV US before next visit as well. Earley Favor

## 2022-11-08 NOTE — Patient Instructions (Addendum)
The vaginal ultrasound order has been placed, all you need to do is call the scheduling desk at [phone] to make the appointment at one of our outpatient imaging centers at your earliest convenience. This is to evaluate your uterus and ovaries.  The report will go directly to epic after.  PLEASE also call the office with the date you have the ultrasound scheduled, so we can schedule a follow-up to discuss results with you.  Dr. Karma Greaser   Take motrin an hour before your next visit for the EMB and skin tag removal. There is also a hormone panel to complete to verify you are in peri or menopause

## 2022-11-14 LAB — CYTOLOGY - PAP
Adequacy: ABSENT
Diagnosis: NEGATIVE
Diagnosis: REACTIVE

## 2022-11-19 HISTORY — PX: SHOULDER SURGERY: SHX246

## 2022-11-26 ENCOUNTER — Ambulatory Visit (HOSPITAL_BASED_OUTPATIENT_CLINIC_OR_DEPARTMENT_OTHER): Admit: 2022-11-26 | Payer: Commercial Managed Care - HMO | Admitting: Orthopedic Surgery

## 2022-11-26 ENCOUNTER — Encounter (HOSPITAL_BASED_OUTPATIENT_CLINIC_OR_DEPARTMENT_OTHER): Payer: Self-pay

## 2022-11-26 SURGERY — SHOULDER ARTHROSCOPY WITH SUBACROMIAL DECOMPRESSION, ROTATOR CUFF REPAIR AND BICEP TENDON REPAIR
Anesthesia: Choice | Laterality: Right

## 2022-12-19 ENCOUNTER — Encounter (HOSPITAL_BASED_OUTPATIENT_CLINIC_OR_DEPARTMENT_OTHER): Payer: Commercial Managed Care - HMO | Admitting: Radiology

## 2022-12-19 DIAGNOSIS — Z1231 Encounter for screening mammogram for malignant neoplasm of breast: Secondary | ICD-10-CM

## 2023-01-13 ENCOUNTER — Other Ambulatory Visit: Payer: Self-pay | Admitting: Internal Medicine

## 2023-03-07 ENCOUNTER — Ambulatory Visit (HOSPITAL_BASED_OUTPATIENT_CLINIC_OR_DEPARTMENT_OTHER)
Admission: RE | Admit: 2023-03-07 | Discharge: 2023-03-07 | Disposition: A | Discharge status: Home / Self Care | Payer: Medicaid Other | Source: Ambulatory Visit | Attending: Family Medicine | Admitting: Family Medicine

## 2023-03-07 ENCOUNTER — Encounter (HOSPITAL_BASED_OUTPATIENT_CLINIC_OR_DEPARTMENT_OTHER): Payer: Self-pay | Admitting: Radiology

## 2023-03-07 DIAGNOSIS — Z1231 Encounter for screening mammogram for malignant neoplasm of breast: Secondary | ICD-10-CM | POA: Insufficient documentation

## 2023-04-18 ENCOUNTER — Other Ambulatory Visit: Payer: Self-pay | Admitting: Obstetrics and Gynecology

## 2023-04-18 ENCOUNTER — Other Ambulatory Visit: Payer: Self-pay

## 2023-04-18 DIAGNOSIS — A6000 Herpesviral infection of urogenital system, unspecified: Secondary | ICD-10-CM

## 2023-04-18 MED ORDER — VALACYCLOVIR HCL 500 MG PO TABS
500.0000 mg | ORAL_TABLET | ORAL | 2 refills | Status: DC
  Filled 2023-04-18: qty 17, 14d supply, fill #0
  Filled 2023-11-02: qty 17, 14d supply, fill #1
  Filled 2023-12-23: qty 17, 14d supply, fill #2

## 2023-04-18 NOTE — Telephone Encounter (Signed)
 Medication refill request: valtrex 500mg  Last AEX:  11-08-22 Next AEX: not scheduled Last MMG (if hormonal medication request): n/a Refill authorized: please approve if appropriate

## 2023-04-21 ENCOUNTER — Other Ambulatory Visit: Payer: Self-pay

## 2023-08-04 ENCOUNTER — Other Ambulatory Visit: Payer: Self-pay | Admitting: Internal Medicine

## 2023-09-12 ENCOUNTER — Encounter (HOSPITAL_BASED_OUTPATIENT_CLINIC_OR_DEPARTMENT_OTHER): Payer: Self-pay

## 2023-09-17 ENCOUNTER — Encounter (HOSPITAL_BASED_OUTPATIENT_CLINIC_OR_DEPARTMENT_OTHER): Payer: Self-pay | Admitting: Certified Nurse Midwife

## 2023-09-17 ENCOUNTER — Ambulatory Visit (INDEPENDENT_AMBULATORY_CARE_PROVIDER_SITE_OTHER): Admitting: Certified Nurse Midwife

## 2023-09-17 ENCOUNTER — Other Ambulatory Visit (HOSPITAL_COMMUNITY)
Admission: RE | Admit: 2023-09-17 | Discharge: 2023-09-17 | Disposition: A | Discharge status: Home / Self Care | Source: Ambulatory Visit | Attending: Certified Nurse Midwife | Admitting: Certified Nurse Midwife

## 2023-09-17 VITALS — BP 130/85 | HR 74 | Ht 59.5 in | Wt 138.2 lb

## 2023-09-17 DIAGNOSIS — N923 Ovulation bleeding: Secondary | ICD-10-CM

## 2023-09-17 DIAGNOSIS — A6000 Herpesviral infection of urogenital system, unspecified: Secondary | ICD-10-CM

## 2023-09-17 NOTE — Progress Notes (Signed)
 GYNECOLOGY  VISIT  CC:   Pt transferring care  HPI: 53 y.o. G0P0000 Single White or Caucasian female here to establish care and discuss pap smear from 10/2022. Pap 11/08/22 was NILM with endometrial cells present.  Sarah Barry has a very supportive boyfriend and a daughter (adopted) and 3 grand-daughters that live in [place] . Due to female issues, they are not sexually active. She works full time as a Catering manager.  She reports her periods are typically monthly and regular. No hot flashes, occasional night sweats. Pt states that between her June and July period she noticed some intermenstrual spotting. Has since resolved.    Past Medical History:  Diagnosis Date   Abnormal Pap smear of cervix    Allergic rhinitis    Anemia    Arthritis    Bipolar disorder (HCC)    Cancer (HCC)    skin ca - buttocks   Chronic headache    during cycle   Colon polyp 04/2012   tubular adenoma   Depression    Dysmenorrhea 05/19/2013   Fatigue    Fibroid    Folliculitis    GERD (gastroesophageal reflux disease)    HSV infection    Positive H. pylori test    Small intestinal bacterial overgrowth (SIBO) 11/30/2019   STD (sexually transmitted disease)    genital warts   Thrombocytosis    Tongue disorder     MEDS:   Current Outpatient Medications on File Prior to Visit  Medication Sig Dispense Refill   B-COMPLEX-C PO Take by mouth.     Cholecalciferol (VITAMIN D -3) 25 MCG (1000 UT) CAPS Take by mouth.     cyclobenzaprine (FLEXERIL) 5 MG tablet Take 5 mg by mouth every 8 (eight) hours as needed. (Patient not taking: Reported on 11/08/2022)     magnesium  gluconate (MAGONATE) 500 MG tablet Take 500 mg by mouth 2 (two) times daily.     meloxicam (MOBIC) 15 MG tablet Take 15 mg by mouth daily.     mirtazapine (REMERON) 30 MG tablet      Multiple Vitamin (MULTIVITAMIN) tablet Take 1 tablet by mouth daily.     Omega-3 Fatty Acids (OMEGA-3 2100 PO) Take by mouth.     omeprazole  (PRILOSEC) 40 MG  capsule TAKE 1 CAPSULE BY MOUTH EVERY MORNING 30 MINUTES BEFORE BREAKFAST 30 capsule 2   oxyCODONE -acetaminophen  (PERCOCET) 10-325 MG tablet Take 0.5-1 tablets by mouth every 4 (four) hours as needed.     tretinoin (RETIN-A) 0.025 % cream Apply 1 a small amount to affected area at bedtime     valACYclovir  (VALTREX ) 500 MG tablet TAKE 1 TABLET (500 MG TOTAL) BY MOUTH 2 (TWO) TIMES DAILY FOR 3 DAYS as needed 30 tablet 2   [DISCONTINUED] QUEtiapine (SEROQUEL) 100 MG tablet Take 150 mg by mouth at bedtime.       No current facility-administered medications on file prior to visit.    ALLERGIES: Patient has no known allergies.  SH:  Mobile Nail Tech  Review of Systems  Constitutional:  Negative for chills and fever.  Genitourinary:  Negative for dysuria, frequency, hematuria and urgency.    PHYSICAL EXAMINATION:    BP 130/85   Pulse 74   Ht 4' 11.5 (1.511 m)   Wt 138 lb 3.2 oz (62.7 kg)   LMP 08/20/2023   BMI 27.45 kg/m     General appearance: alert, cooperative and appears stated age   Pelvic: External genitalia:  no lesions  Urethra:  normal appearing urethra with no masses, tenderness or lesions              Bartholins and Skenes: normal                 Vagina: normal without tenderness, induration or masses              Cervix: no cervical motion tenderness, no lesions, and nulliparous appearance              Bimanual Exam:  Uterus:  normal size, contour, position, consistency, mobility, non-tender              Adnexa: no mass, fullness, tenderness  Chaperone,  CMA, was present for exam.  Assessment/Plan: 1. Intermenstrual bleeding (Primary) - Discussed recommendation for endometrial biopsy given recent intermenstrual spotting. - If endometrial biopsy negative, schedule patient for US  to evaluate uterus/ovaries/endometrial lining after next menstrual period. - Surgical pathology( [place]/ [facility])  Arland POUR Hulon Ferron

## 2023-09-18 LAB — SURGICAL PATHOLOGY

## 2023-09-22 ENCOUNTER — Ambulatory Visit (HOSPITAL_BASED_OUTPATIENT_CLINIC_OR_DEPARTMENT_OTHER): Payer: Self-pay | Admitting: Certified Nurse Midwife

## 2023-09-24 ENCOUNTER — Other Ambulatory Visit (HOSPITAL_BASED_OUTPATIENT_CLINIC_OR_DEPARTMENT_OTHER)

## 2023-10-09 ENCOUNTER — Other Ambulatory Visit: Payer: Self-pay | Admitting: Internal Medicine

## 2023-11-03 ENCOUNTER — Other Ambulatory Visit: Payer: Self-pay

## 2023-11-30 ENCOUNTER — Encounter (HOSPITAL_BASED_OUTPATIENT_CLINIC_OR_DEPARTMENT_OTHER): Payer: Self-pay | Admitting: Certified Nurse Midwife

## 2023-12-01 ENCOUNTER — Other Ambulatory Visit (HOSPITAL_BASED_OUTPATIENT_CLINIC_OR_DEPARTMENT_OTHER): Payer: Self-pay | Admitting: Certified Nurse Midwife

## 2023-12-01 DIAGNOSIS — Z8742 Personal history of other diseases of the female genital tract: Secondary | ICD-10-CM

## 2023-12-03 ENCOUNTER — Other Ambulatory Visit (HOSPITAL_BASED_OUTPATIENT_CLINIC_OR_DEPARTMENT_OTHER)

## 2023-12-04 ENCOUNTER — Other Ambulatory Visit: Payer: Self-pay | Admitting: Internal Medicine

## 2023-12-26 ENCOUNTER — Other Ambulatory Visit: Payer: Self-pay

## 2023-12-29 ENCOUNTER — Other Ambulatory Visit (HOSPITAL_BASED_OUTPATIENT_CLINIC_OR_DEPARTMENT_OTHER): Payer: Self-pay

## 2023-12-29 DIAGNOSIS — A6000 Herpesviral infection of urogenital system, unspecified: Secondary | ICD-10-CM

## 2023-12-29 MED ORDER — VALACYCLOVIR HCL 500 MG PO TABS: 500.0000 mg | ORAL_TABLET | ORAL | 0 refills | Status: AC

## 2023-12-31 ENCOUNTER — Other Ambulatory Visit (HOSPITAL_BASED_OUTPATIENT_CLINIC_OR_DEPARTMENT_OTHER)

## 2023-12-31 ENCOUNTER — Other Ambulatory Visit (HOSPITAL_BASED_OUTPATIENT_CLINIC_OR_DEPARTMENT_OTHER): Admitting: Obstetrics & Gynecology

## 2023-12-31 ENCOUNTER — Other Ambulatory Visit (HOSPITAL_BASED_OUTPATIENT_CLINIC_OR_DEPARTMENT_OTHER): Payer: Self-pay | Admitting: Obstetrics & Gynecology

## 2023-12-31 DIAGNOSIS — N926 Irregular menstruation, unspecified: Secondary | ICD-10-CM

## 2023-12-31 DIAGNOSIS — N951 Menopausal and female climacteric states: Secondary | ICD-10-CM

## 2023-12-31 NOTE — Progress Notes (Deleted)
   GYNECOLOGY  VISIT  CC:   No chief complaint on file.   HPI: 53 y.o. G0P0000 Single White or Caucasian female here for ultrasound follow up.  No LMP recorded.  Past Medical History:  Diagnosis Date   Abnormal Pap smear of cervix    Allergic rhinitis    Anemia    Arthritis    Bipolar disorder (HCC)    Cancer (HCC)    skin ca - buttocks   Chronic headache    during cycle   Colon polyp 04/2012   tubular adenoma   Depression    Dysmenorrhea 05/19/2013   Fatigue    Fibroid    Folliculitis    GERD (gastroesophageal reflux disease)    HSV infection    Positive H. pylori test    Small intestinal bacterial overgrowth (SIBO) 11/30/2019   STD (sexually transmitted disease)    genital warts   Thrombocytosis    Tongue disorder     MEDS:  Reviewed in EPIC  ALLERGIES: Patient has no known allergies.  SH:  ***  ROS  PHYSICAL EXAMINATION:    There were no vitals taken for this visit.    General appearance: alert, cooperative and appears stated age Neck: no adenopathy, supple, symmetrical, trachea midline and thyroid {CHL AMB PHY EX THYROID NORM DEFAULT:[phone]::normal to inspection and palpation} CV:  {Exam; heart brief:31539} Lungs:  {pe lungs ob:314451} Breasts: {Exam; breast:13139::normal appearance, no masses or tenderness} Abdomen: soft, non-tender; bowel sounds normal; no masses,  no organomegaly Lymph:  no inguinal LAD noted  Pelvic: External genitalia:  no lesions              Urethra:  normal appearing urethra with no masses, tenderness or lesions              Bartholins and Skenes: normal                 Vagina: {exam; pelvic vaginal:30846}              Cervix: {CHL AMB PHY EX CERVIX NORM [phone]::no lesions}              Bimanual Exam:  Uterus:  {CHL AMB PHY EX UTERUS NORM DEFAULT:[phone]::normal size, contour, position, consistency, mobility, non-tender}              Adnexa: {CHL AMB PHY EX ADNEXA NO MASS DEFAULT:[phone]::no  mass, fullness, tenderness}              Rectovaginal: {yes no:314532}.  Confirms.              Anus:  normal sphincter tone, no lesions  Chaperone was present for exam.  Assessment/Plan: There are no diagnoses linked to this encounter.

## 2024-01-05 ENCOUNTER — Other Ambulatory Visit: Payer: Self-pay | Admitting: Internal Medicine

## 2024-01-07 ENCOUNTER — Other Ambulatory Visit (HOSPITAL_BASED_OUTPATIENT_CLINIC_OR_DEPARTMENT_OTHER)

## 2024-01-27 ENCOUNTER — Other Ambulatory Visit: Payer: Self-pay | Admitting: Internal Medicine

## 2024-02-04 ENCOUNTER — Other Ambulatory Visit: Payer: Self-pay | Admitting: Internal Medicine

## 2024-02-27 ENCOUNTER — Other Ambulatory Visit: Payer: Self-pay | Admitting: Internal Medicine

## 2024-03-01 ENCOUNTER — Other Ambulatory Visit (HOSPITAL_BASED_OUTPATIENT_CLINIC_OR_DEPARTMENT_OTHER): Payer: Self-pay | Admitting: Family Medicine

## 2024-03-01 DIAGNOSIS — Z1231 Encounter for screening mammogram for malignant neoplasm of breast: Secondary | ICD-10-CM

## 2024-03-26 ENCOUNTER — Encounter (HOSPITAL_BASED_OUTPATIENT_CLINIC_OR_DEPARTMENT_OTHER): Admitting: Radiology

## 2024-03-26 DIAGNOSIS — Z1231 Encounter for screening mammogram for malignant neoplasm of breast: Secondary | ICD-10-CM

## 2024-04-02 ENCOUNTER — Ambulatory Visit (HOSPITAL_BASED_OUTPATIENT_CLINIC_OR_DEPARTMENT_OTHER): Admitting: Radiology
